# Patient Record
Sex: Female | Born: 1950 | Race: White | Hispanic: No | Marital: Married | Smoking: Never smoker
Health system: Southern US, Community
[De-identification: ages and names within clinical notes are randomized; demographics above are authoritative.]

## PROBLEM LIST (undated history)

## (undated) DIAGNOSIS — N183 Chronic kidney disease, stage 3 unspecified: Secondary | ICD-10-CM

## (undated) DIAGNOSIS — N289 Disorder of kidney and ureter, unspecified: Secondary | ICD-10-CM

## (undated) DIAGNOSIS — I10 Essential (primary) hypertension: Secondary | ICD-10-CM

## (undated) DIAGNOSIS — M810 Age-related osteoporosis without current pathological fracture: Secondary | ICD-10-CM

## (undated) HISTORY — PX: ABDOMINAL HYSTERECTOMY: SHX81

## (undated) HISTORY — PX: ANUS SURGERY: SHX302

## (undated) HISTORY — PX: BLADDER SURGERY: SHX569

---

## 2007-03-18 ENCOUNTER — Emergency Department (HOSPITAL_COMMUNITY): Admission: EM | Admit: 2007-03-18 | Discharge: 2007-03-18 | Payer: Self-pay | Admitting: Emergency Medicine

## 2007-07-13 ENCOUNTER — Ambulatory Visit: Payer: Self-pay | Admitting: Gastroenterology

## 2007-07-17 ENCOUNTER — Ambulatory Visit: Payer: Self-pay | Admitting: Gastroenterology

## 2007-07-17 ENCOUNTER — Observation Stay (HOSPITAL_COMMUNITY): Admission: AD | Admit: 2007-07-17 | Discharge: 2007-07-19 | Payer: Self-pay | Admitting: Gastroenterology

## 2007-07-19 ENCOUNTER — Ambulatory Visit: Payer: Self-pay | Admitting: Gastroenterology

## 2007-07-19 ENCOUNTER — Encounter: Payer: Self-pay | Admitting: Gastroenterology

## 2009-07-13 ENCOUNTER — Ambulatory Visit (HOSPITAL_COMMUNITY): Admission: RE | Admit: 2009-07-13 | Discharge: 2009-07-13 | Payer: Self-pay | Admitting: Family Medicine

## 2010-05-18 NOTE — Consult Note (Signed)
NAME:  Julia Mora, Julia Mora              ACCOUNT NO.:  [id number]   MEDICAL RECORD NO.:  [id number]          PATIENT TYPE:  AMB   LOCATION:  DAY                           FACILITY:  [facility]   PHYSICIAN:  Kassie Mends, M.D.      DATE OF BIRTH:  1950/04/09   DATE OF CONSULTATION:  DATE OF DISCHARGE:                                 CONSULTATION   REQUESTING PHYSICIAN:  Donna Bernard, M.D.   REASON FOR CONSULTATION:  History of adenomatous polyp due for  colonoscopy and chronic constipation.   HISTORY OF PRESENT ILLNESS:  Julia Mora is a 60 year old Caucasian  female with a history of chronic constipation.  She generally tells me  she has a bowel movement up just about every day, but at times have  small amounts of significant amount of straining.  She has increased the  fiber in her diet and been eating Bran Buns or Fiber One for breakfast.  This does seem to help significantly.  She denies any abdominal pain.  She does have bloating, usually in the evenings.  She is sensitive to  lactose and tries to avoid this.  She is taking vitamin D  supplementation and calcium.  She has noted dark stools, but denies any  rectal bleeding or melena.  She denies any history of diarrhea.  She  occasionally has fecal seepage since she had surgery in 1988 after a  difficult childbirth where she had her bladder tacked, sphincter repair,  and she has a history of rectocele.  She has rare indigestion.  Denies  any heartburn.  Denies any dysphagia, odynophagia, anorexia, or early  satiety.  Her weight has remained stable, although she does note she has  gained a couple of pounds over the last year or so.  She does have a  history of a large colon polyp which was removed in [place].  The  biopsy was not retrieved for pathology; however, she was told she would  need a repeat colonoscopy last year in 2008.  She did not present for  this given the fact that she was here in [place].   PAST MEDICAL AND  SURGICAL HISTORY:  Rectocele, chronic constipation,  colonoscopy in June 2005 were she had her large colon polyp removed, she  is status post partial hysterectomy, bladder tack, and sphincter repair.   CURRENT MEDICATIONS:  1. Hyzaar 50/12.5 mg daily.  2. Premarin 0.3 mg daily.  3. Tricor 125 mg daily.  4. Actonel 35 mg weekly.  5. Calcium 1200 mg.  6. Vitamin D 1 gram daily.  7. Fish oil 1 gram t.i.d.  8. Flax seed 1 gram daily.  9. Cinnamon 1 gram daily.  10.Aspirin 81 mg daily.  11.Mucinex p.r.n.  12.Claritin 10 mg p.r.n.  13.Fluticasone nasal spray daily.  14.Zaditor eye drops daily.   ALLERGIES:  ERYTHROMYCIN and CODEINE causes nausea and vomiting.   FAMILY HISTORY:  There is a family history of her mother having colon  polyps as well.  She is 88.  She has dementia.  Father deceased at 73  secondary to coronary artery disease and  MI.  She has 2 healthy  brothers.   SOCIAL HISTORY:  Julia Mora has been married for 34 years.  She has 2  grown healthy children.  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  Never has seen a smoker.  Denies any drug  use and rarely consumes red wine a couple of times a month at most.   REVIEW OF SYSTEMS:  See HPI, otherwise negative.   PHYSICAL EXAM:  VITAL SIGNS:  Weight 114 pounds, height 60 inches, temp  98.7, blood pressure 110/78, and pulse 64.  GENERAL:  She is a well-developed, well-nourished Caucasian female who  is alert, oriented, and pleasant in no acute distress.  HEENT:  Sclerae clear, nonicteric.  Conjunctivae pink.  Oropharynx pink  and moist without any lesions.  NECK:  Supple without mass or thyromegaly.  CHEST:  Heart regular rate rhythm.  Normal S1 and S2.  No murmurs,  clicks, rubs or gallops.  LUNGS:  Clear to auscultation bilaterally.  ABDOMEN:  Positive bowel sounds x4.  No bruits auscultated.  Soft,  nontender, and nondistended without mass or hepatosplenomegaly.  No  rebound,  tenderness, or guarding.  EXTREMITIES:  Without clubbing or edema.  SKIN:  Pale, warm, and dry without any rash or jaundice.   IMPRESSION:  Julia Mora is a 60 year old Caucasian female with chronic  constipation which responds to high-fiber diet.  I would suggest that  she continue fiber supplementation or consider adding fiber  supplementation on a daily basis.   She has a history of large polyp, unable to retrieve for pathology back  in June 2005.  It was recommended she have another colonoscopy in 3  years for surveillance.   PLAN:  1. Surveillance colonoscopy with Dr. Cira Servant in the near future.      Discussed procedures, risks, and benefits which include but not      limited to bleeding, infection, perforation, and drug reaction.      She agrees with the plan and consent was obtained.  2. She has been sent home literature on chronic constipation.  3. Benefiber samples to be taken once daily or fiber supplement of      choice.   Thank you Dr. Gerda Diss for allowing [place] to participate in the care of [name].      Lorenza Burton, N.P.      Kassie Mends, M.D.  Electronically Signed    KJ/MEDQ  D:  07/13/2007  T:  07/14/2007  Job:  [id number]   cc:   Donna Bernard, M.D.  Fax: [phone]

## 2010-05-18 NOTE — H&P (Signed)
Julia Mora, Julia Mora              ACCOUNT NO.:  [id number]   MEDICAL RECORD NO.:  [id number]          PATIENT TYPE:  OBV   LOCATION:  IC09                          FACILITY:  [facility]   PHYSICIAN:  Kassie Mends, M.D.      DATE OF BIRTH:  04/28/1950   DATE OF ADMISSION:  07/17/2007  DATE OF DISCHARGE:  LH                              HISTORY & PHYSICAL   PRIMARY CARE PHYSICIAN:  Donna Bernard, M.D.   REASON FOR ADMISSION:  Mental status changes.   HISTORY OF PRESENT ILLNESS:  Julia Mora is a 60 year old female who  has a history of chronic constipation.  Julia Mora reports having a colonoscopy  in [facility] for a large colon polyp but the polyp was not retrieved.  Julia Mora has a history of having to strain with bowel movements.  Julia Mora was  scheduled for a colonoscopy for surveillance because her outside  gastroenterologist recommended a 3-year follow-up.  Julia Mora took the Tri-  lyte prep. Julia Mora had multiple episodes of vomiting and multiple episodes  of diarrhea and began to have mental status changes.  Julia Mora began to have  mental status changes, was weak, and was crawling onthe bathroom floor.  I was contacted by the CRNA, Dillard Cannon, and asked the patient to be  directly admitted to the floor.   PAST MEDICAL HISTORY:  Obtained from the husband and the chart due to  the patient's mental status changes.  Rectocele.   PAST SURGICAL HISTORY:  Partial hysterectomy with bladder repair.   ALLERGIES:  ERYTHROMYCIN, CODEINE.   MEDICATIONS:  1. Hyzaar 50/12.5 mg daily.  2. Premarin.  3. Tricor.  4. Actonel.  5. Calcium.  6. Vitamin D.  7. Fish Oil.  8. Flax seed.  9. Cinnamon.  10.Aspirin 81 mg daily.  11.Claritin as needed.  12.Fluticasone daily.  13.Zaditor for allergic conjunctivitis.   FAMILY HISTORY:  Her mother had colon polyps.  Her mother has dementia.   SOCIAL HISTORY:  Julia Mora is married for 34 years.  Her husband is a  Engineering geologist here at [employer] [facility].   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  Julia Mora rarely drinks  alcohol.   REVIEW OF SYSTEMS:  As per the HPI, otherwise as limited by the  patient's inability to give a history.   PHYSICAL EXAMINATION:  VITAL SIGNS:  Temperature 97.5, pulse 82,  respiratory rate 22, blood pressure 144/87, O2 saturation 100% on room  air.  GENERAL:  Julia Mora is in no apparent distress, but appears slightly agitated.  Julia Mora is involuntarily moving her arms and shaking.  Julia Mora is alert and  interactive.  HEENT:  Normocephalic and atraumatic.  Pupils equal, round, and reactive  to light.  Mouth; no oral lesions.  Posterior pharynx without erythema  or exudate.  NECK:  Full range of motion with no lymphadenopathy. LUNGS:  Clear to  auscultation bilaterally. CARDIOVASCULAR:  Regular rhythm, no murmur,  normal S1 and S2. ABDOMEN:  Bowel sounds are present.  Soft, nontender,  and nondistended.  No rebound or guarding.  EXTREMITIES:  No  cyanosis or edema. NEUROLOGY:  Julia Mora has no focal  neurologic deficits.   LABORATORY DATA:  Sodium 119, potassium 3.2, BUN 14, creatinine 1.0,  calcium 8.6, magnesium 1.4 (blood drawn from the arm that has no IV  fluids infusing).   RADIOGRAPHIC STUDIES:  CT scan of the head without contrast; no acute  intracranial abnormality, scattered bilateral subcortical areas of white  matter hypoattenuation.   ASSESSMENT:  Julia Mora is a 60 year old female who was admitted with  mental status changes that is secondary to acute hyponatremia.  The  acute hyponatremia was brought on by hypovolemia from diarrhea and  polyethylene glycol bowel prep.   PLAN:  1. Normal saline at 75 mL per hour.  BMP every 6 hours.  2. Will replace magnesium with 6 grams total of magnesium sulfate.      Julia Mora will have a total of 80 mEq potassium infused.  3. Will only continue hydralazine as needed for blood pressure.  Will      hold her hydrochlorothiazide and losartan as well as  other      outpatient meds.  4. We will give one 0.5 mg of Ativan to decrease agitation.  5. NPO.  Will mix medications in normal saline instead of D5W when      possible.  6. Will transfer her to the ICU from the floor for overnight      observation.  Julia Mora does have slightly increased risk of seizure      activity due to the sodium levels of less than 120.   Discussed plan with Dr. Sharol Harness.      Kassie Mends, M.D.  Electronically Signed     SM/MEDQ  D:  07/18/2007  T:  07/18/2007  Job:  [id number]   cc:   Lorin Picket A. Gerda Diss, MD  Fax: [phone]

## 2010-05-18 NOTE — Op Note (Signed)
Julia Mora, TUCKERMAN              ACCOUNT NO.:  [id number]   MEDICAL RECORD NO.:  [id number]          PATIENT TYPE:  OBV   LOCATION:  A335                          FACILITY:  [facility]   PHYSICIAN:  Kassie Mends, M.D.      DATE OF BIRTH:  1950/10/20   DATE OF PROCEDURE:  07/19/2007  DATE OF DISCHARGE:                               OPERATIVE REPORT   REFERRING PHYSICIAN:  Donna Bernard, M.D.   PROCEDURE:  Colonoscopy with cold forceps polypectomy.   INDICATIONS FOR PROCEDURE:  [name] is a 60 year old female with a  personal history of polyps. Her preparation for the colonoscopy was  complicated by acute hyponatremia, hypomagnesemia, and hypokalemia.   FINDINGS:  1. A 3 mm rectal polyp removed via cold forceps.  Otherwise no masses,      inflammatory changes, diverticula or AVM seen.  2. Normal retroflexed view of the rectum.   DIAGNOSIS:  Small rectal polyp.   RECOMMENDATIONS:  1. Will call Ms. Bordas with results of her biopsies.  No aspirin,      NSAIDs or anticoagulation for 5 days.  2. She may resume her Hyzaar on discharge.  3. If she has a simple adenoma then she should have a screening      colonoscopy in 10 years.  4. High-fiber diet.  5. Advance diet today.   MEDICATIONS:  1. Demerol 50 mg IV.  2. Versed 4 mg IV.   PROCEDURE TECHNIQUE:  Physical exam was performed.  Informed consent was  obtained from the patient after explaining the benefits, risks and  alternatives to the procedure.  The patient was connected to the monitor  and placed in left lateral position.  Continuous oxygen was provided by  nasal cannula, IV medicine administered through an indwelling cannula.  After administration of sedation and rectal exam, the patient's rectum  was intubated and the  scope was advanced under direct visualization to the cecum.  Scope was  removed slowly by carefully examine the color, texture, anatomy and  integrity of the mucosa on the way out.  The patient  was recovered in  endoscopy and discharged to the floor in satisfactory condition.      Kassie Mends, M.D.  Electronically Signed     SM/MEDQ  D:  07/19/2007  T:  07/19/2007  Job:  [id number]   cc:   Donna Bernard, M.D.  Fax: [phone]

## 2010-05-21 NOTE — Discharge Summary (Signed)
Julia Mora, Julia Mora              ACCOUNT NO.:  [id number]   MEDICAL RECORD NO.:  [id number]          PATIENT TYPE:  OBV   LOCATION:  A335                          FACILITY:  [facility]   PHYSICIAN:  Kassie Mends, M.D.      DATE OF BIRTH:  23-Dec-1950   DATE OF ADMISSION:  07/17/2007  DATE OF DISCHARGE:  07/16/2009LH                               DISCHARGE SUMMARY   DISCHARGE DIAGNOSES:  1. Acute hyponatremia, hypokalemia, and hypomagnesemia after taking      TriLyte bowel prep which was associated with mental status changes      and weakness.  2. Hypertension on Hyzaar.  3. Hyperlipidemia.  4. Osteoporosis.  5. Allergies.   PROCEDURES:  Colonoscopy with cold forceps polypectomy.   HISTORY OF PRESENT ILLNESS:  Julia Mora is a 60 year old female who  was prepping for colonoscopy due to the history of a large colon polyp  removed in [place], [place].  She did the TriLyte prep and had  multiple episodes of vomiting, diarrhea, and developed mental status  changes.   HOSPITAL COURSE:  Julia Mora was admitted with mental status changes,  weakness, and crawling on the bathroom floor.  On admission, she was  afebrile and hemodynamically stable.  CT scan of the head revealed  scattered bilateral subcortical areas of white matter, which were  nonspecific.  She also was found of a sodium 119, potassium of 3.2, and  magnesium of 1.4.  She was placed on normal saline, and on the day of  discharge, her sodium is 138.  Her magnesium was replaced to a level of  3.4.  Her potassium was 3.9.  Her mental status was improved on hospital  day 2.  She was continued on a clear liquid diet.  Colonoscopy was  performed on July 19, 2007.  She was discharged in satisfactory  condition and returned to her cognitive baseline.   RECOMMENDATIONS:  The patient should have future bowel preps that do not  include large volume polyethylene glycol.  I discussed this with [name] and her husband.   The alternatives of bowel preps include  Gatorade prep with MiraLax or a MiraLax bowel prep or OsmoPrep.      Kassie Mends, M.D.  Electronically Signed    SM/MEDQ  D:  08/01/2007  T:  08/02/2007  Job:  [id number]   cc:   Donna Bernard, M.D.  Fax: [phone]

## 2010-09-27 LAB — BASIC METABOLIC PANEL
Creatinine, Ser: 1.27 — ABNORMAL HIGH
GFR calc Af Amer: 53 — ABNORMAL LOW
GFR calc non Af Amer: 44 — ABNORMAL LOW
Potassium: 4.4
Sodium: 138

## 2010-09-30 LAB — BASIC METABOLIC PANEL
Calcium: 8.6
GFR calc Af Amer: >60

## 2010-10-01 LAB — BASIC METABOLIC PANEL
BUN: 11
BUN: 11
BUN: 9
CO2: 21
CO2: 21
CO2: 22
CO2: 23
Calcium: 7.6 — ABNORMAL LOW
Calcium: 8.3 — ABNORMAL LOW
Calcium: 8.8
Chloride: 111
Chloride: 92 — ABNORMAL LOW
Chloride: 95 — ABNORMAL LOW
Creatinine, Ser: 0.94
Creatinine, Ser: 0.95
Creatinine, Ser: 1.03
GFR calc Af Amer: >60
GFR calc Af Amer: >60
GFR calc non Af Amer: 59 — ABNORMAL LOW
Glucose, Bld: 100 — ABNORMAL HIGH
Glucose, Bld: 104 — ABNORMAL HIGH
Glucose, Bld: 123 — ABNORMAL HIGH
Glucose, Bld: 124 — ABNORMAL HIGH
Potassium: 3.9
Potassium: 3.9
Potassium: 4
Potassium: 4.1
Sodium: 123 — ABNORMAL LOW
Sodium: 124 — ABNORMAL LOW
Sodium: 131 — ABNORMAL LOW

## 2010-10-01 LAB — HEPATIC FUNCTION PANEL
ALT: 16
AST: 36
Albumin: 3.5
Indirect Bilirubin: 0.8
Total Bilirubin: 1

## 2010-10-01 LAB — MAGNESIUM
Magnesium: 3.4 — ABNORMAL HIGH
Magnesium: 3.7 — ABNORMAL HIGH

## 2012-07-14 ENCOUNTER — Encounter (HOSPITAL_BASED_OUTPATIENT_CLINIC_OR_DEPARTMENT_OTHER): Payer: Self-pay | Admitting: *Deleted

## 2012-07-14 ENCOUNTER — Emergency Department (HOSPITAL_BASED_OUTPATIENT_CLINIC_OR_DEPARTMENT_OTHER)
Admission: EM | Admit: 2012-07-14 | Discharge: 2012-07-14 | Disposition: A | Discharge status: Home / Self Care | Payer: BC Managed Care – PPO | Attending: Emergency Medicine | Admitting: Emergency Medicine

## 2012-07-14 ENCOUNTER — Emergency Department (HOSPITAL_BASED_OUTPATIENT_CLINIC_OR_DEPARTMENT_OTHER): Payer: BC Managed Care – PPO

## 2012-07-14 DIAGNOSIS — X500XXA Overexertion from strenuous movement or load, initial encounter: Secondary | ICD-10-CM | POA: Insufficient documentation

## 2012-07-14 DIAGNOSIS — Z8739 Personal history of other diseases of the musculoskeletal system and connective tissue: Secondary | ICD-10-CM | POA: Insufficient documentation

## 2012-07-14 DIAGNOSIS — S92309A Fracture of unspecified metatarsal bone(s), unspecified foot, initial encounter for closed fracture: Secondary | ICD-10-CM | POA: Insufficient documentation

## 2012-07-14 DIAGNOSIS — I10 Essential (primary) hypertension: Secondary | ICD-10-CM | POA: Insufficient documentation

## 2012-07-14 DIAGNOSIS — Z87448 Personal history of other diseases of urinary system: Secondary | ICD-10-CM | POA: Insufficient documentation

## 2012-07-14 DIAGNOSIS — S92351A Displaced fracture of fifth metatarsal bone, right foot, initial encounter for closed fracture: Secondary | ICD-10-CM

## 2012-07-14 DIAGNOSIS — Y9301 Activity, walking, marching and hiking: Secondary | ICD-10-CM | POA: Insufficient documentation

## 2012-07-14 DIAGNOSIS — Z79899 Other long term (current) drug therapy: Secondary | ICD-10-CM | POA: Insufficient documentation

## 2012-07-14 DIAGNOSIS — Y9289 Other specified places as the place of occurrence of the external cause: Secondary | ICD-10-CM | POA: Insufficient documentation

## 2012-07-14 HISTORY — DX: Age-related osteoporosis without current pathological fracture: M81.0

## 2012-07-14 HISTORY — DX: Essential (primary) hypertension: I10

## 2012-07-14 HISTORY — DX: Disorder of kidney and ureter, unspecified: N28.9

## 2012-07-14 MED ORDER — HYDROCODONE-ACETAMINOPHEN 5-325 MG PO TABS: 0.5000 | ORAL_TABLET | ORAL | Status: DC | PRN

## 2012-07-14 MED ORDER — HYDROCODONE-ACETAMINOPHEN 5-325 MG PO TABS
1.0000 | ORAL_TABLET | Freq: Once | ORAL | Status: AC
  Administered 2012-07-14: 1 via ORAL
  Filled 2012-07-14: qty 1

## 2012-07-14 NOTE — ED Provider Notes (Signed)
History     This chart was scribed for Julia Seamen, MD by Julia Mora, ED Scribe. The patient was seen in room MH05/MH05 and the patient's care was started at 11:01 PM.  CSN: [id number] Arrival date & time 07/14/12  2058   Chief Complaint  Patient presents with  . Foot Injury   The history is provided by the patient and medical records. No language interpreter was used.   HPI Comments: Julia Mora is a 62 y.o. female who presents to the Emergency Department complaining of a pop in her right lateral foot around 6:15 pm.  She has subsequently had moderate pain to lateral aspect of right foot anterior to lateral malleolus.  She states that she was walking when this injury occurred.  No numbness or weakness in right foot.  Pt denies neck injury, headache, diaphoresis, fever, chills, nausea, vomiting, diarrhea, weakness, cough, SOB and any other pain.   Past Medical History  Diagnosis Date  . Hypertension   . Renal disorder   . Osteoporosis    Past Surgical History  Procedure Laterality Date  . Abdominal hysterectomy    . Bladder surgery    . Anus surgery     History reviewed. No pertinent family history. History  Substance Use Topics  . Smoking status: Never Smoker   . Smokeless tobacco: Not on file  . Alcohol Use: No   OB History   Grav Para Term Preterm Abortions TAB SAB Ect Mult Living                 Review of Systems  All other systems reviewed and are negative.    Allergies  Codeine and Erythromycin  Home Medications   Current Outpatient Rx  Name  Route  Sig  Dispense  Refill  . fenofibrate (TRICOR) 145 MG tablet   Oral   Take 145 mg by mouth daily.         Marland Kitchen losartan (COZAAR) 25 MG tablet   Oral   Take 25 mg by mouth daily.         Marland Kitchen HYDROcodone-acetaminophen (NORCO/VICODIN) 5-325 MG per tablet   Oral   Take 0.5-1 tablets by mouth every 4 (four) hours as needed for pain.   20 tablet   0    BP 154/80  Pulse 72  Temp(Src) 98.7 F (37.1  C) (Oral)  Resp 20  Ht 5' (1.524 m)  Wt 112 lb (50.803 kg)  BMI 21.87 kg/m2  SpO2 99% Physical Exam  Nursing note and vitals reviewed. Constitutional: She is oriented to person, place, and time. She appears well-developed and well-nourished. No distress.  HENT:  Head: Normocephalic and atraumatic.  Right Ear: External ear normal.  Left Ear: External ear normal.  Eyes: EOM are normal. Pupils are equal, round, and reactive to light.  Neck: Normal range of motion. Neck supple. No tracheal deviation present.  Cardiovascular: Normal rate, regular rhythm and normal heart sounds.  Exam reveals no gallop and no friction rub.   No murmur heard. Pulmonary/Chest: Effort normal and breath sounds normal. No respiratory distress.  Abdominal: Soft. Bowel sounds are normal. She exhibits no distension. There is no tenderness.  Musculoskeletal: Normal range of motion. She exhibits tenderness.  Tenderness over base of right 5th metatarsal with no appreciable swelling or ecchymosis.  Right foot distally neurovascularly intact with right nervous function intact.  No tenderness of instability of right ankle.  No proximal right fibular tenderness.  Neurological: She is alert and  oriented to person, place, and time.  Neurologically intact.  Skin: Skin is warm and dry.  Psychiatric: She has a normal mood and affect. Her behavior is normal.    ED Course  Procedures (including critical care time) DIAGNOSTIC STUDIES: Oxygen Saturation is 99% on RA, normal by my interpretation.    COORDINATION OF CARE: 11:04 PM - Discussed ED treatment with pt at bedside including pain management and pt agrees.   Discussed elevation and icing area.  Follow up with orthopedist.  Labs Reviewed - No data to display Dg Foot Complete Right  07/14/2012   *RADIOLOGY REPORT*  Clinical Data: Foot injury.  RIGHT FOOT COMPLETE - 3+ VIEW  Comparison: None.  Findings: Examination demonstrates mild diffuse osteopenia.  There is a  nondisplaced transverse fracture to the base of the fifth metatarsal.  There mild degenerate changes of the first MTP joint. Remainder of the exam is unremarkable.  IMPRESSION: Nondisplaced transverse fracture through the base of the fifth metatarsal.   Original Report Authenticated By: Elberta Fortis, M.D.   1. Fracture of base of fifth metatarsal bone, right, closed, initial encounter     MDM  I personally performed the services described in this documentation, which was scribed in my presence.  The recorded information has been reviewed and is accurate.    Carlisle Beers Julia Kronberg, MD 07/15/12 0630

## 2012-07-14 NOTE — ED Notes (Signed)
Pt states she has a hx of osteoporosis and was just walking across the room and felt something "pop" in her right foot. C/O pain to same. PMS intact.

## 2012-07-18 ENCOUNTER — Ambulatory Visit (INDEPENDENT_AMBULATORY_CARE_PROVIDER_SITE_OTHER): Payer: BC Managed Care – PPO | Admitting: Family Medicine

## 2012-07-18 ENCOUNTER — Encounter: Payer: Self-pay | Admitting: Family Medicine

## 2012-07-18 VITALS — BP 150/89 | HR 62 | Ht 60.0 in | Wt 112.0 lb

## 2012-07-18 DIAGNOSIS — M79671 Pain in right foot: Secondary | ICD-10-CM

## 2012-07-18 DIAGNOSIS — M79609 Pain in unspecified limb: Secondary | ICD-10-CM

## 2012-07-18 NOTE — Patient Instructions (Addendum)
You have a Jones fracture of your foot.   We will start with conservative treatment of this. Do not put any weight on casted foot. Use wheelchair and/or knee scooter to get around. Anticipate 8-12 weeks of this treatment. Follow up with me in 2-4 weeks to remove cast and repeat x-rays (when you come back depends on when your cast feels like it's uncomfortable or too loose - come back in 4 weeks at the latest though). Elevate above the level of your heart as much as possible. Tylenol and/or aleve as needed for pain. If you need something stronger for pain call me.

## 2012-07-24 ENCOUNTER — Encounter: Payer: Self-pay | Admitting: Family Medicine

## 2012-07-24 DIAGNOSIS — M79671 Pain in right foot: Secondary | ICD-10-CM | POA: Insufficient documentation

## 2012-07-24 NOTE — Progress Notes (Signed)
Patient ID: Julia Mora, female   DOB: February 02, 1950, 62 y.o.   MRN: [id number]  PCP: Julia Mora  Subjective:   HPI: Patient is a 62 y.o. female here for right foot injury.  Patient reports on 7/12 she was walking across the room when she felt a pop lateral aspect of right foot. Started icing, taking tylenol. Went to ED and had x-rays showing a transverse 5th metatarsal fracture [name]). Placed in a postop shoe, referred to [place]. Some bruising and swelling. Has history of a cracked bone in 2003 of this foot or ankle but unsure which one. Takes actonel monthly and has known osteopenia.  Past Medical History  Diagnosis Date  . Hypertension   . Renal disorder   . Osteoporosis     Current Outpatient Prescriptions on File Prior to Visit  Medication Sig Dispense Refill  . fenofibrate (TRICOR) 145 MG tablet Take 145 mg by mouth daily.      Marland Kitchen HYDROcodone-acetaminophen (NORCO/VICODIN) 5-325 MG per tablet Take 0.5-1 tablets by mouth every 4 (four) hours as needed for pain.  20 tablet  0  . losartan (COZAAR) 25 MG tablet Take 25 mg by mouth daily.       No current facility-administered medications on file prior to visit.    Past Surgical History  Procedure Laterality Date  . Abdominal hysterectomy    . Bladder surgery    . Anus surgery      Allergies  Allergen Reactions  . Codeine Other (See Comments)    Abd pain  . Erythromycin Other (See Comments)    Abd pain    History   Social History  . Marital Status: Married    Spouse Name: N/A    Number of Children: N/A  . Years of Education: N/A   Occupational History  . Not on file.   Social History Main Topics  . Smoking status: Never Smoker   . Smokeless tobacco: Not on file  . Alcohol Use: No  . Drug Use: No  . Sexually Active: Not on file   Other Topics Concern  . Not on file   Social History Narrative  . No narrative on file    Family History  Problem Relation Age of Onset  . Heart attack Father   .  Diabetes Neg Hx   . Hyperlipidemia Neg Hx   . Hypertension Neg Hx     BP 150/89  Pulse 62  Ht 5' (1.524 m)  Wt 112 lb (50.803 kg)  BMI 21.87 kg/m2  Review of Systems: See HPI above.    Objective:  Physical Exam:  Gen: NAD  R foot/ankle: Mild swelling, bruising lateral foot.  No other deformity. FROM ankle. TTP focally base 5th metatarsal. Negative ant drawer and talar tilt.   Negative syndesmotic compression. Thompsons test negative. NV intact distally.    Assessment & Plan:  1. Jones fracture - discussed both operative and nonoperative management.  Advised typically in her case would trial conservative care first before considering surgery.  She would like to proceed in this manner - discussed can take 6-12 weeks to heal and must be non weight bearing for it to do so.  She wants to use a wheelchair - believes her husband can get one from work.  Short leg cast placed today - return in 2-4 weeks to remove this and repeat x-rays.  Elevation.  Tylenol, aleve as needed for pain.  Declined stronger pain medicine for now.

## 2012-07-24 NOTE — Assessment & Plan Note (Signed)
Jones fracture - discussed both operative and nonoperative management.  Advised typically in her case would trial conservative care first before considering surgery.  She would like to proceed in this manner - discussed can take 6-12 weeks to heal and must be non weight bearing for it to do so.  She wants to use a wheelchair - believes her husband can get one from work.  Short leg cast placed today - return in 2-4 weeks to remove this and repeat x-rays.  Elevation.  Tylenol, aleve as needed for pain.  Declined stronger pain medicine for now.

## 2012-08-01 ENCOUNTER — Ambulatory Visit (INDEPENDENT_AMBULATORY_CARE_PROVIDER_SITE_OTHER): Payer: BC Managed Care – PPO | Admitting: Family Medicine

## 2012-08-01 ENCOUNTER — Ambulatory Visit (HOSPITAL_BASED_OUTPATIENT_CLINIC_OR_DEPARTMENT_OTHER)
Admission: RE | Admit: 2012-08-01 | Discharge: 2012-08-01 | Disposition: A | Discharge status: Home / Self Care | Payer: BC Managed Care – PPO | Source: Ambulatory Visit | Attending: Family Medicine | Admitting: Family Medicine

## 2012-08-01 ENCOUNTER — Encounter: Payer: Self-pay | Admitting: Family Medicine

## 2012-08-01 VITALS — BP 152/101 | HR 66 | Ht 60.0 in | Wt 111.0 lb

## 2012-08-01 DIAGNOSIS — X58XXXA Exposure to other specified factors, initial encounter: Secondary | ICD-10-CM | POA: Insufficient documentation

## 2012-08-01 DIAGNOSIS — S92309A Fracture of unspecified metatarsal bone(s), unspecified foot, initial encounter for closed fracture: Secondary | ICD-10-CM | POA: Insufficient documentation

## 2012-08-01 DIAGNOSIS — M79609 Pain in unspecified limb: Secondary | ICD-10-CM

## 2012-08-01 DIAGNOSIS — M79671 Pain in right foot: Secondary | ICD-10-CM

## 2012-08-02 ENCOUNTER — Encounter: Payer: Self-pay | Admitting: Family Medicine

## 2012-08-02 NOTE — Assessment & Plan Note (Signed)
Jones fracture - 2 1/2 weeks out from initial injury.  No pain subjectively and minimal tenderness at fracture site now.  So doing well with clinical healing though no callus yet seen on radiographs.  New cast placed today - still non-weight bearing.  Advised to return in 4 weeks for cast removal, repeat radiographs (can return sooner if cast becomes loose or any other complaints).  Tylenol, elevation as needed.

## 2012-08-02 NOTE — Progress Notes (Signed)
Patient ID: Julia Mora, female   DOB: 1950/06/29, 62 y.o.   MRN: [id number]  PCP: Aida Puffer  Subjective:   HPI: Patient is a 62 y.o. female here for f/u right foot injury.  7/16: Patient reports on 7/12 she was walking across the room when she felt a pop lateral aspect of right foot. Started icing, taking tylenol. Went to ED and had x-rays showing a transverse 5th metatarsal fracture [name]). Placed in a postop shoe, referred to [place]. Some bruising and swelling. Has history of a cracked bone in 2003 of this foot or ankle but unsure which one. Takes actonel monthly and has known osteopenia.  7/30: Patient reports her pain has improved since last visit. Not weight bearing - using a wheelchair or walker with knee up on the seat. Not taking anything for pain. Doing well with cast with no complaints.  Past Medical History  Diagnosis Date  . Hypertension   . Renal disorder   . Osteoporosis     Current Outpatient Prescriptions on File Prior to Visit  Medication Sig Dispense Refill  . ACTONEL 150 MG tablet       . alclomethasone (ACLOVATE) 0.05 % cream       . estradiol (ESTRACE) 0.5 MG tablet       . fenofibrate (TRICOR) 145 MG tablet Take 145 mg by mouth daily.      Marland Kitchen HYDROcodone-acetaminophen (NORCO/VICODIN) 5-325 MG per tablet Take 0.5-1 tablets by mouth every 4 (four) hours as needed for pain.  20 tablet  0  . losartan (COZAAR) 25 MG tablet Take 25 mg by mouth daily.       No current facility-administered medications on file prior to visit.    Past Surgical History  Procedure Laterality Date  . Abdominal hysterectomy    . Bladder surgery    . Anus surgery      Allergies  Allergen Reactions  . Codeine Other (See Comments)    Abd pain  . Erythromycin Other (See Comments)    Abd pain    History   Social History  . Marital Status: Married    Spouse Name: N/A    Number of Children: N/A  . Years of Education: N/A   Occupational History  . Not on file.    Social History Main Topics  . Smoking status: Never Smoker   . Smokeless tobacco: Not on file  . Alcohol Use: No  . Drug Use: No  . Sexually Active: Not on file   Other Topics Concern  . Not on file   Social History Narrative  . No narrative on file    Family History  Problem Relation Age of Onset  . Heart attack Father   . Diabetes Neg Hx   . Hyperlipidemia Neg Hx   . Hypertension Neg Hx     BP 152/101  Pulse 66  Ht 5' (1.524 m)  Wt 111 lb (50.349 kg)  BMI 21.68 kg/m2  Review of Systems: See HPI above.    Objective:  Physical Exam:  Gen: NAD  R foot/ankle: Minimal swelling, no bruising lateral foot.  No other deformity. Did not test ROM ankle. Minimal TTP focally base 5th metatarsal. Negative ant drawer and talar tilt.   Negative syndesmotic compression. NV intact distally.    Assessment & Plan:  1. Jones fracture - 2 1/2 weeks out from initial injury.  No pain subjectively and minimal tenderness at fracture site now.  So doing well with clinical healing though no  callus yet seen on radiographs.  New cast placed today - still non-weight bearing.  Advised to return in 4 weeks for cast removal, repeat radiographs (can return sooner if cast becomes loose or any other complaints).  Tylenol, elevation as needed.

## 2012-08-17 ENCOUNTER — Ambulatory Visit (INDEPENDENT_AMBULATORY_CARE_PROVIDER_SITE_OTHER): Payer: BC Managed Care – PPO | Admitting: Family Medicine

## 2012-08-17 ENCOUNTER — Encounter: Payer: Self-pay | Admitting: Family Medicine

## 2012-08-17 VITALS — BP 162/103 | HR 57 | Ht 60.0 in | Wt 111.0 lb

## 2012-08-17 DIAGNOSIS — M79671 Pain in right foot: Secondary | ICD-10-CM

## 2012-08-17 DIAGNOSIS — S8992XA Unspecified injury of left lower leg, initial encounter: Secondary | ICD-10-CM

## 2012-08-17 DIAGNOSIS — M79609 Pain in unspecified limb: Secondary | ICD-10-CM

## 2012-08-17 DIAGNOSIS — S8990XA Unspecified injury of unspecified lower leg, initial encounter: Secondary | ICD-10-CM

## 2012-08-21 ENCOUNTER — Encounter: Payer: Self-pay | Admitting: Family Medicine

## 2012-08-21 DIAGNOSIS — S8992XA Unspecified injury of left lower leg, initial encounter: Secondary | ICD-10-CM | POA: Insufficient documentation

## 2012-08-21 NOTE — Assessment & Plan Note (Signed)
no red flags and exam is currently benign.  Will continue to monitor.

## 2012-08-21 NOTE — Progress Notes (Signed)
Patient ID: Julia Mora, female   DOB: [birth year], 62 y.o.   MRN: [id number]  PCP: Aida Puffer  Subjective:   HPI: Patient is a 62 y.o. female here for f/u right foot injury.  7/16: Patient reports on 7/12 she was walking across the room when she felt a pop lateral aspect of right foot. Started icing, taking tylenol. Went to ED and had x-rays showing a transverse 5th metatarsal fracture [name]). Placed in a postop shoe, referred to [place]. Some bruising and swelling. Has history of a cracked bone in 2003 of this foot or ankle but unsure which one. Takes actonel monthly and has known osteopenia.  7/30: Patient reports her pain has improved since last visit. Not weight bearing - using a wheelchair or walker with knee up on the seat. Not taking anything for pain. Doing well with cast with no complaints.  8/15: Overall doing well. She fell last week at the wedding she was attending and fell directly onto both knees and wrists - wrists and knees improving though left knee is still a little stiff. No reinjury to her right foot.  Past Medical History  Diagnosis Date  . Hypertension   . Renal disorder   . Osteoporosis     Current Outpatient Prescriptions on File Prior to Visit  Medication Sig Dispense Refill  . ACTONEL 150 MG tablet       . alclomethasone (ACLOVATE) 0.05 % cream       . estradiol (ESTRACE) 0.5 MG tablet       . fenofibrate (TRICOR) 145 MG tablet Take 145 mg by mouth daily.      Marland Kitchen HYDROcodone-acetaminophen (NORCO/VICODIN) 5-325 MG per tablet Take 0.5-1 tablets by mouth every 4 (four) hours as needed for pain.  20 tablet  0  . losartan (COZAAR) 25 MG tablet Take 25 mg by mouth daily.       No current facility-administered medications on file prior to visit.    Past Surgical History  Procedure Laterality Date  . Abdominal hysterectomy    . Bladder surgery    . Anus surgery      Allergies  Allergen Reactions  . Codeine Other (See Comments)    Abd pain   . Erythromycin Other (See Comments)    Abd pain    History   Social History  . Marital Status: Married    Spouse Name: N/A    Number of Children: N/A  . Years of Education: N/A   Occupational History  . Not on file.   Social History Main Topics  . Smoking status: Never Smoker   . Smokeless tobacco: Not on file  . Alcohol Use: No  . Drug Use: No  . Sexual Activity: Not on file   Other Topics Concern  . Not on file   Social History Narrative  . No narrative on file    Family History  Problem Relation Age of Onset  . Heart attack Father   . Diabetes Neg Hx   . Hyperlipidemia Neg Hx   . Hypertension Neg Hx     BP 162/103  Pulse 57  Ht 5' (1.524 m)  Wt 111 lb (50.349 kg)  BMI 21.68 kg/m2  Review of Systems: See HPI above.    Objective:  Physical Exam:  Gen: NAD  R foot/ankle: Cast is intact - did not remove at this time. Minimal swelling, no bruising lateral foot.  No other deformity. Did not test ROM ankle. Minimal TTP focally base 5th metatarsal.  Negative ant drawer and talar tilt.   Negative syndesmotic compression. NV intact distally.    L knee: No gross deformity, ecchymoses, swelling. No TTP. FROM. Negative ant/post drawers. Negative valgus/varus testing. Negative lachmanns. Negative mcmurrays, apleys, patellar apprehension. NV intact distally.  Assessment & Plan:  1. Jones fracture - About 4 1/2 weeks out from initial injury - recommended we wait until she is 6 weeks out to remove cast and repeat her x-rays and exam at that time.  Tylenol, elevation as needed.    2. Left knee injury - no red flags and exam is currently benign.  Will continue to monitor.

## 2012-08-21 NOTE — Assessment & Plan Note (Signed)
Jones fracture - About 4 1/2 weeks out from initial injury - recommended we wait until she is 6 weeks out to remove cast and repeat her x-rays and exam at that time.  Tylenol, elevation as needed.

## 2012-08-29 ENCOUNTER — Ambulatory Visit (INDEPENDENT_AMBULATORY_CARE_PROVIDER_SITE_OTHER): Payer: BC Managed Care – PPO | Admitting: Family Medicine

## 2012-08-29 ENCOUNTER — Ambulatory Visit (HOSPITAL_BASED_OUTPATIENT_CLINIC_OR_DEPARTMENT_OTHER)
Admission: RE | Admit: 2012-08-29 | Discharge: 2012-08-29 | Disposition: A | Discharge status: Home / Self Care | Payer: BC Managed Care – PPO | Source: Ambulatory Visit | Attending: Family Medicine | Admitting: Family Medicine

## 2012-08-29 ENCOUNTER — Ambulatory Visit: Payer: BC Managed Care – PPO | Admitting: Family Medicine

## 2012-08-29 ENCOUNTER — Encounter: Payer: Self-pay | Admitting: Family Medicine

## 2012-08-29 VITALS — BP 150/93 | HR 64 | Ht 60.0 in | Wt 111.0 lb

## 2012-08-29 DIAGNOSIS — M79609 Pain in unspecified limb: Secondary | ICD-10-CM

## 2012-08-29 DIAGNOSIS — M79671 Pain in right foot: Secondary | ICD-10-CM

## 2012-08-29 DIAGNOSIS — X58XXXA Exposure to other specified factors, initial encounter: Secondary | ICD-10-CM | POA: Insufficient documentation

## 2012-08-29 DIAGNOSIS — S92309A Fracture of unspecified metatarsal bone(s), unspecified foot, initial encounter for closed fracture: Secondary | ICD-10-CM | POA: Insufficient documentation

## 2012-08-29 DIAGNOSIS — M19079 Primary osteoarthritis, unspecified ankle and foot: Secondary | ICD-10-CM | POA: Insufficient documentation

## 2012-08-29 NOTE — Patient Instructions (Addendum)
You have evidence of callus on your ultrasound and no pain, have completed 6 weeks of casting without weight bearing. Because of this switch to a comfortable shoe and over the next 1-2 weeks start bearing weight. It's ok if you have a little bit of pain (1-2 out of 10) but it shouldn't be worse than this. If it is, stop and give me a call. Ice the area as needed for pain. Follow up with me in 2 weeks for reevaluation, probable final visit.

## 2012-08-30 ENCOUNTER — Encounter: Payer: Self-pay | Admitting: Family Medicine

## 2012-08-30 NOTE — Assessment & Plan Note (Signed)
Jones fracture - 6 weeks out from fracture - completed immobilization in this time without weight bearing.  Has callus formation both on radiographs and ultrasound and no pain clinically, no tenderness.  Will start weight bearing at this time.  Switch to a comfortable shoe and progressively put more and more weight on this foot in next 1-2 weeks.  Ice as needed.  F/u in 2 weeks to see how she's doing with this. If pain intensifies stop bearing weight and call me.

## 2012-08-30 NOTE — Progress Notes (Signed)
Patient ID: Julia Mora, female   DOB: 1950-03-22, 62 y.o.   MRN: [id number]  PCP: Aida Puffer  Subjective:   HPI: Patient is a 62 y.o. female here for f/u right foot injury.  7/16: Patient reports on 7/12 she was walking across the room when she felt a pop lateral aspect of right foot. Started icing, taking tylenol. Went to ED and had x-rays showing a transverse 5th metatarsal fracture [name]). Placed in a postop shoe, referred to [place]. Some bruising and swelling. Has history of a cracked bone in 2003 of this foot or ankle but unsure which one. Takes actonel monthly and has known osteopenia.  7/30: Patient reports her pain has improved since last visit. Not weight bearing - using a wheelchair or walker with knee up on the seat. Not taking anything for pain. Doing well with cast with no complaints.  8/15: Overall doing well. She fell last week at the wedding she was attending and fell directly onto both knees and wrists - wrists and knees improving though left knee is still a little stiff. No reinjury to her right foot.  8/27: Patient has no pain and done well in cast. Not taking any medicines for pain. Elevating foot.  Past Medical History  Diagnosis Date  . Hypertension   . Renal disorder   . Osteoporosis     Current Outpatient Prescriptions on File Prior to Visit  Medication Sig Dispense Refill  . ACTONEL 150 MG tablet       . alclomethasone (ACLOVATE) 0.05 % cream       . estradiol (ESTRACE) 0.5 MG tablet       . fenofibrate (TRICOR) 145 MG tablet Take 145 mg by mouth daily.      Marland Kitchen losartan (COZAAR) 25 MG tablet Take 25 mg by mouth daily.       No current facility-administered medications on file prior to visit.    Past Surgical History  Procedure Laterality Date  . Abdominal hysterectomy    . Bladder surgery    . Anus surgery      Allergies  Allergen Reactions  . Codeine Other (See Comments)    Abd pain  . Erythromycin Other (See Comments)    Abd  pain    History   Social History  . Marital Status: Married    Spouse Name: N/A    Number of Children: N/A  . Years of Education: N/A   Occupational History  . Not on file.   Social History Main Topics  . Smoking status: Never Smoker   . Smokeless tobacco: Not on file  . Alcohol Use: No  . Drug Use: No  . Sexual Activity: Not on file   Other Topics Concern  . Not on file   Social History Narrative  . No narrative on file    Family History  Problem Relation Age of Onset  . Heart attack Father   . Diabetes Neg Hx   . Hyperlipidemia Neg Hx   . Hypertension Neg Hx     BP 150/93  Pulse 64  Ht 5' (1.524 m)  Wt 111 lb (50.349 kg)  BMI 21.68 kg/m2  Review of Systems: See HPI above.    Objective:  Physical Exam:  Gen: NAD  R foot/ankle: Cast removed. Minimal swelling, no bruising lateral foot.  No other deformity. Did not test ROM ankle. No TTP 5th metatarsal. Negative ant drawer and talar tilt.   Negative syndesmotic compression. NV intact distally.  Assessment &  Plan:  1. Jones fracture - 6 weeks out from fracture - completed immobilization in this time without weight bearing.  Has callus formation both on radiographs and ultrasound and no pain clinically, no tenderness.  Will start weight bearing at this time.  Switch to a comfortable shoe and progressively put more and more weight on this foot in next 1-2 weeks.  Ice as needed.  F/u in 2 weeks to see how she's doing with this. If pain intensifies stop bearing weight and call me.

## 2012-09-12 ENCOUNTER — Encounter: Payer: Self-pay | Admitting: Family Medicine

## 2012-09-12 ENCOUNTER — Ambulatory Visit (INDEPENDENT_AMBULATORY_CARE_PROVIDER_SITE_OTHER): Payer: BC Managed Care – PPO | Admitting: Family Medicine

## 2012-09-12 VITALS — BP 130/84 | HR 67 | Ht 60.0 in | Wt 110.0 lb

## 2012-09-12 DIAGNOSIS — M79609 Pain in unspecified limb: Secondary | ICD-10-CM

## 2012-09-12 DIAGNOSIS — M79671 Pain in right foot: Secondary | ICD-10-CM

## 2012-09-13 ENCOUNTER — Encounter: Payer: Self-pay | Admitting: Family Medicine

## 2012-09-13 NOTE — Assessment & Plan Note (Signed)
Jones fracture - 8 weeks out from fracture - completed 6 weeks immobilization and radiographs/ultrasound showed callus formation.  Done very well past 2 weeks with start of ambulation.  At this point will discharge without restrictions.  F/u prn.

## 2012-09-13 NOTE — Patient Instructions (Addendum)
Instructed to follow up as needed 

## 2012-09-13 NOTE — Progress Notes (Signed)
Patient ID: Julia Mora, female   DOB: May 09, 1950, 62 y.o.   MRN: [id number]  PCP: Julia Mora  Subjective:   HPI: Patient is a 62 y.o. female here for f/u right foot injury.  7/16: Patient reports on 7/12 she was walking across the room when she felt a pop lateral aspect of right foot. Started icing, taking tylenol. Went to ED and had x-rays showing a transverse 5th metatarsal fracture Julia Mora). Placed in a postop shoe, referred to [place]. Some bruising and swelling. Has history of a cracked bone in 2003 of this foot or ankle but unsure which one. Takes actonel monthly and has known osteopenia.  7/30: Patient reports her pain has improved since last visit. Not weight bearing - using a wheelchair or walker with knee up on the seat. Not taking anything for pain. Doing well with cast with no complaints.  8/15: Overall doing well. She fell last week at the wedding she was attending and fell directly onto both knees and wrists - wrists and knees improving though left knee is still a little stiff. No reinjury to her right foot.  8/27: Patient has no pain and done well in cast. Not taking any medicines for pain. Elevating foot.  9/10: Patient still having no pain at fracture site. Walking in regular shoes. Has occasional twinge in great toe only. Not taking any medications for pain.  Past Medical History  Diagnosis Date  . Hypertension   . Renal disorder   . Osteoporosis     Current Outpatient Prescriptions on File Prior to Visit  Medication Sig Dispense Refill  . ACTONEL 150 MG tablet       . alclomethasone (ACLOVATE) 0.05 % cream       . estradiol (ESTRACE) 0.5 MG tablet       . fenofibrate (TRICOR) 145 MG tablet Take 145 mg by mouth daily.      Marland Kitchen losartan (COZAAR) 25 MG tablet Take 25 mg by mouth daily.       No current facility-administered medications on file prior to visit.    Past Surgical History  Procedure Laterality Date  . Abdominal hysterectomy    .  Bladder surgery    . Anus surgery      Allergies  Allergen Reactions  . Codeine Other (See Comments)    Abd pain  . Erythromycin Other (See Comments)    Abd pain    History   Social History  . Marital Status: Married    Spouse Name: N/A    Number of Children: N/A  . Years of Education: N/A   Occupational History  . Not on file.   Social History Main Topics  . Smoking status: Never Smoker   . Smokeless tobacco: Not on file  . Alcohol Use: No  . Drug Use: No  . Sexual Activity: Not on file   Other Topics Concern  . Not on file   Social History Narrative  . No narrative on file    Family History  Problem Relation Age of Onset  . Heart attack Father   . Diabetes Neg Hx   . Hyperlipidemia Neg Hx   . Hypertension Neg Hx     BP 130/84  Pulse 67  Ht 5' (1.524 m)  Wt 110 lb (49.896 kg)  BMI 21.48 kg/m2  Review of Systems: See HPI above.    Objective:  Physical Exam:  Gen: NAD  R foot/ankle: Cast removed. No swelling, no bruising lateral foot.  No other  deformity. FROM ankle without pain, 5/5 strength. No TTP 5th metatarsal. Negative ant drawer and talar tilt.   Negative syndesmotic compression. NV intact distally.  Assessment & Plan:  1. Jones fracture - 8 weeks out from fracture - completed 6 weeks immobilization and radiographs/ultrasound showed callus formation.  Done very well past 2 weeks with start of ambulation.  At this point will discharge without restrictions.  F/u prn.

## 2012-09-18 ENCOUNTER — Telehealth: Payer: Self-pay | Admitting: *Deleted

## 2012-09-18 NOTE — Telephone Encounter (Signed)
Patient called and said that she is going on a trip and will be doing a lot of walking. [place] Spaniel that she is having a fair amount of swelling, redness and puffiness. Wants to know what she should do next (ace wrap, ankle brace).

## 2012-09-18 NOTE — Telephone Encounter (Signed)
As discussed the swelling is expected and usually takes months to resolve.  She should ice it, elevate it when possible, ACE wrap OR ankle brace would be fine.  But no restrictions on her walking in [place].

## 2014-03-18 IMAGING — CR DG FOOT COMPLETE 3+V*R*
3 series · 3 of 3 positions shown · non-contrast
Comparison: 07/14/2012

CLINICAL DATA: Follow up fifth metatarsal fracture

RIGHT FOOT COMPLETE - 3+ VIEW

[t foot ap right]
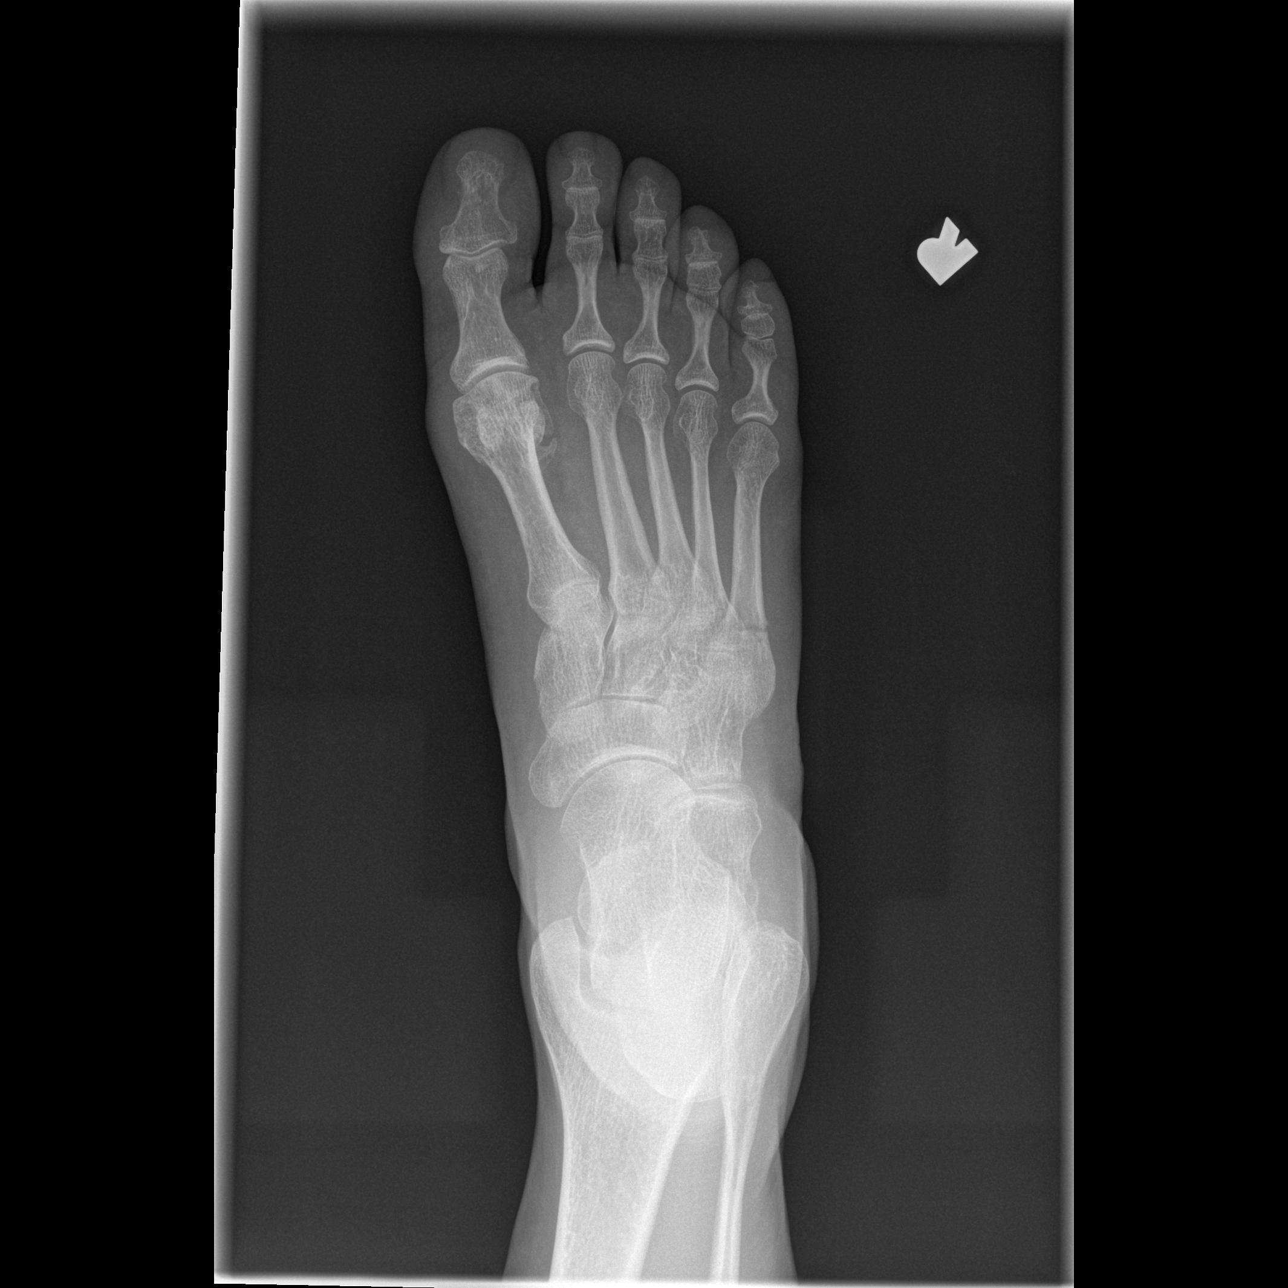

[t foot oblique right]
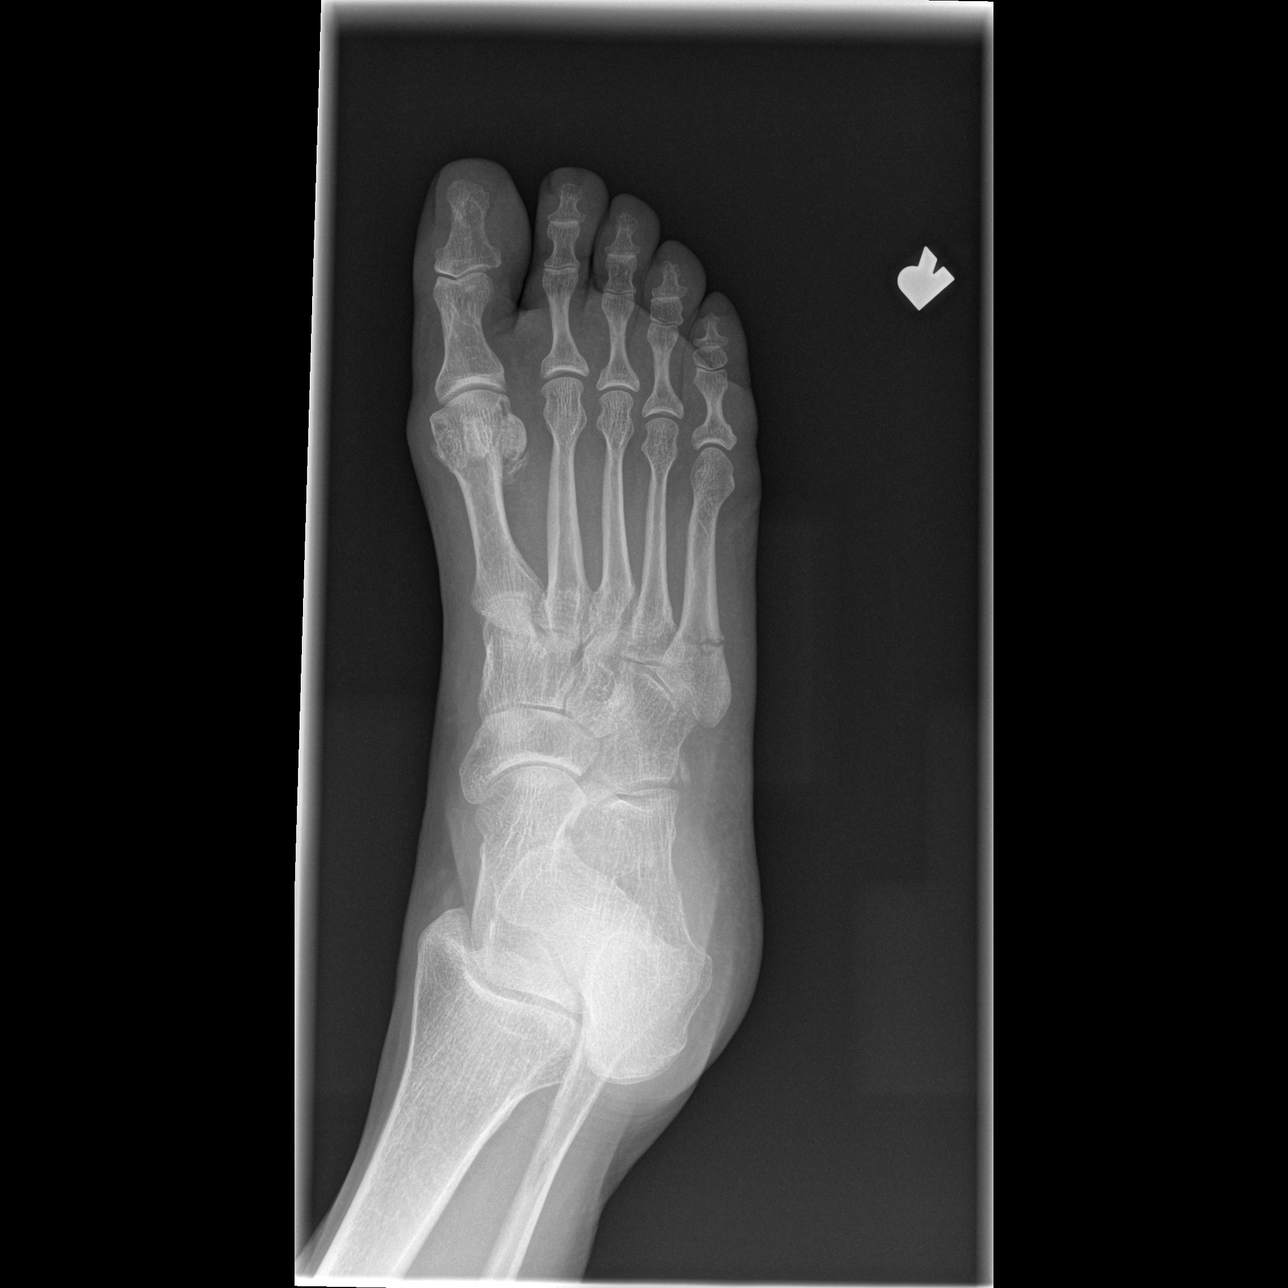

[t foot lat right]
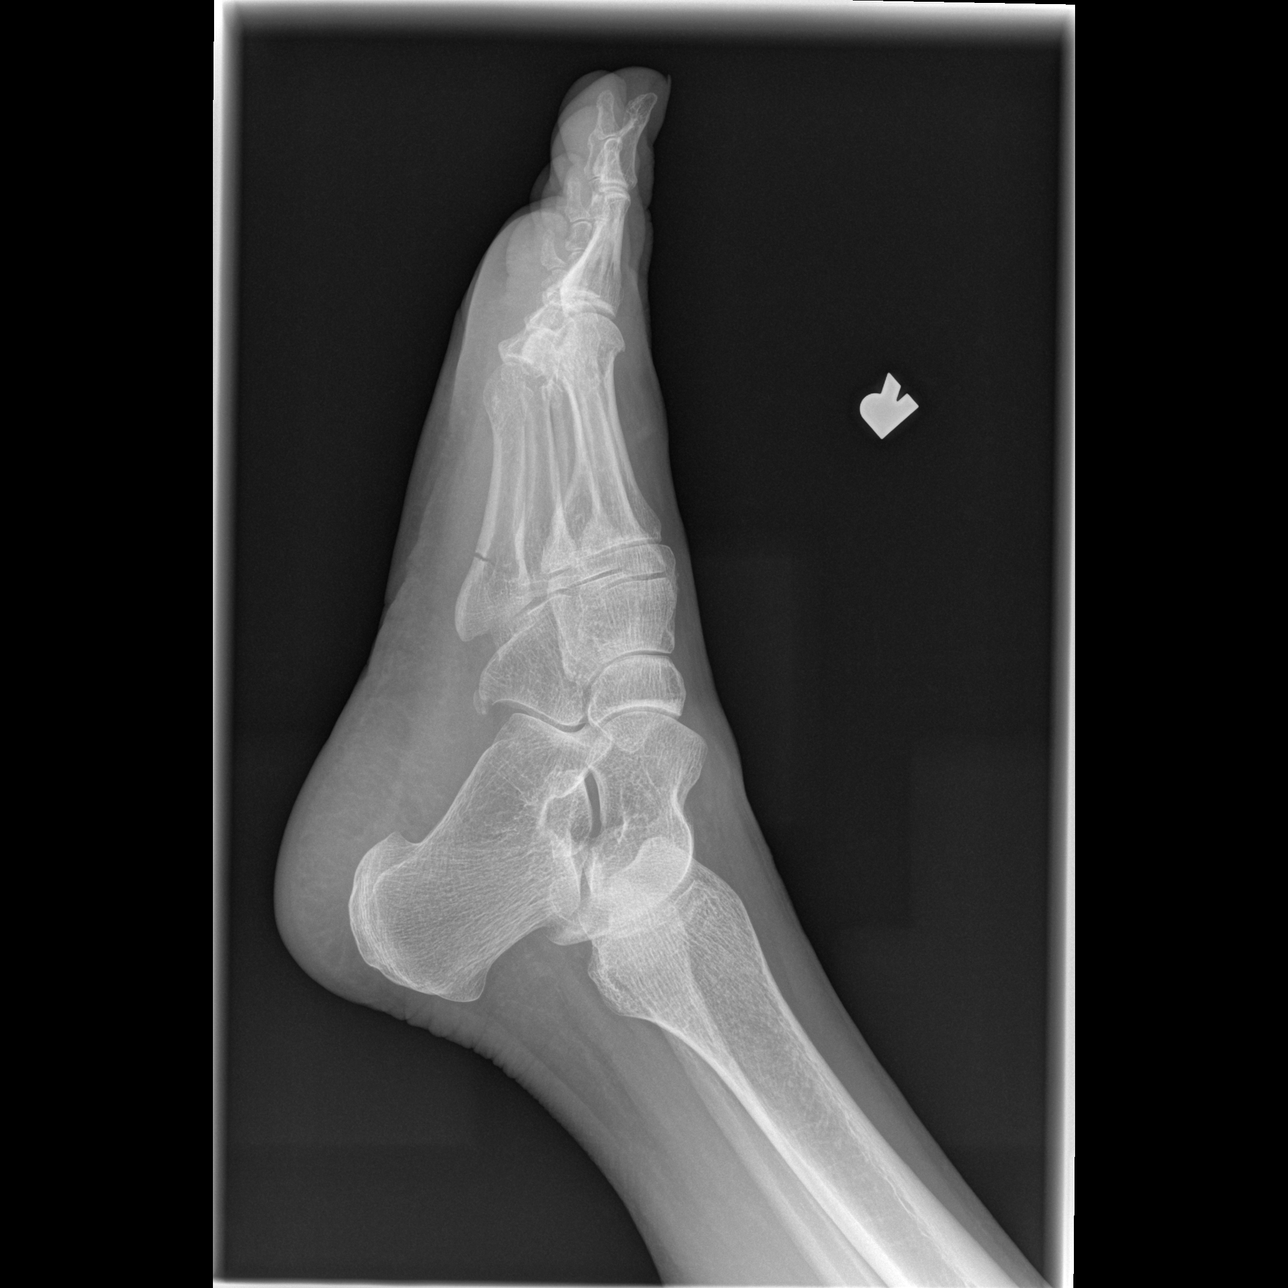

[3 of 3 positions shown; findings below may reference images not displayed]

FINDINGS: Three views of the right foot submitted.  Again noted nondisplaced
transverse fracture at the base of fifth metatarsal without
misalignment.  The fracture line is still visible without
significant callus formation.
IMPRESSION: Again noted nondisplaced transverse fracture at the base of fifth
metatarsal without misalignment.  The fracture line is still
visible without significant callus formation.]

## 2017-05-31 ENCOUNTER — Encounter: Payer: Self-pay | Admitting: Gastroenterology

## 2019-02-15 ENCOUNTER — Ambulatory Visit: Payer: Medicare PPO | Attending: Internal Medicine

## 2019-06-01 ENCOUNTER — Other Ambulatory Visit: Payer: Self-pay

## 2019-06-01 ENCOUNTER — Inpatient Hospital Stay (HOSPITAL_COMMUNITY): Payer: Medicare PPO

## 2019-06-01 ENCOUNTER — Encounter (HOSPITAL_COMMUNITY): Payer: Self-pay | Admitting: Internal Medicine

## 2019-06-01 ENCOUNTER — Inpatient Hospital Stay (HOSPITAL_COMMUNITY)
Admission: EM | Admit: 2019-06-01 | Discharge: 2019-06-07 | DRG: 481 | Disposition: A | Discharge status: Skilled Nursing Facility | Payer: Medicare PPO | Attending: Internal Medicine | Admitting: Internal Medicine

## 2019-06-01 ENCOUNTER — Emergency Department (HOSPITAL_COMMUNITY): Payer: Medicare PPO

## 2019-06-01 DIAGNOSIS — M5136 Other intervertebral disc degeneration, lumbar region: Secondary | ICD-10-CM | POA: Diagnosis present

## 2019-06-01 DIAGNOSIS — R52 Pain, unspecified: Secondary | ICD-10-CM

## 2019-06-01 DIAGNOSIS — Z01818 Encounter for other preprocedural examination: Secondary | ICD-10-CM

## 2019-06-01 DIAGNOSIS — I129 Hypertensive chronic kidney disease with stage 1 through stage 4 chronic kidney disease, or unspecified chronic kidney disease: Secondary | ICD-10-CM | POA: Diagnosis present

## 2019-06-01 DIAGNOSIS — D62 Acute posthemorrhagic anemia: Secondary | ICD-10-CM | POA: Diagnosis not present

## 2019-06-01 DIAGNOSIS — Z7982 Long term (current) use of aspirin: Secondary | ICD-10-CM

## 2019-06-01 DIAGNOSIS — W19XXXA Unspecified fall, initial encounter: Secondary | ICD-10-CM | POA: Diagnosis present

## 2019-06-01 DIAGNOSIS — M25552 Pain in left hip: Secondary | ICD-10-CM | POA: Diagnosis present

## 2019-06-01 DIAGNOSIS — N179 Acute kidney failure, unspecified: Secondary | ICD-10-CM | POA: Diagnosis present

## 2019-06-01 DIAGNOSIS — Y92830 Public park as the place of occurrence of the external cause: Secondary | ICD-10-CM | POA: Diagnosis not present

## 2019-06-01 DIAGNOSIS — F329 Major depressive disorder, single episode, unspecified: Secondary | ICD-10-CM | POA: Diagnosis present

## 2019-06-01 DIAGNOSIS — F4322 Adjustment disorder with anxiety: Secondary | ICD-10-CM | POA: Diagnosis present

## 2019-06-01 DIAGNOSIS — M5412 Radiculopathy, cervical region: Secondary | ICD-10-CM | POA: Diagnosis present

## 2019-06-01 DIAGNOSIS — I10 Essential (primary) hypertension: Secondary | ICD-10-CM | POA: Diagnosis present

## 2019-06-01 DIAGNOSIS — M810 Age-related osteoporosis without current pathological fracture: Secondary | ICD-10-CM | POA: Diagnosis present

## 2019-06-01 DIAGNOSIS — Z90710 Acquired absence of both cervix and uterus: Secondary | ICD-10-CM | POA: Diagnosis not present

## 2019-06-01 DIAGNOSIS — Z20822 Contact with and (suspected) exposure to covid-19: Secondary | ICD-10-CM | POA: Diagnosis present

## 2019-06-01 DIAGNOSIS — Z419 Encounter for procedure for purposes other than remedying health state, unspecified: Secondary | ICD-10-CM

## 2019-06-01 DIAGNOSIS — N183 Chronic kidney disease, stage 3 unspecified: Secondary | ICD-10-CM | POA: Diagnosis present

## 2019-06-01 DIAGNOSIS — N1831 Chronic kidney disease, stage 3a: Secondary | ICD-10-CM | POA: Diagnosis present

## 2019-06-01 DIAGNOSIS — S7292XA Unspecified fracture of left femur, initial encounter for closed fracture: Secondary | ICD-10-CM | POA: Diagnosis present

## 2019-06-01 DIAGNOSIS — S72332A Displaced oblique fracture of shaft of left femur, initial encounter for closed fracture: Secondary | ICD-10-CM

## 2019-06-01 DIAGNOSIS — W101XXA Fall (on)(from) sidewalk curb, initial encounter: Secondary | ICD-10-CM | POA: Diagnosis present

## 2019-06-01 DIAGNOSIS — Z79899 Other long term (current) drug therapy: Secondary | ICD-10-CM

## 2019-06-01 HISTORY — DX: Chronic kidney disease, stage 3 unspecified: N18.30

## 2019-06-01 LAB — CBC WITH DIFFERENTIAL/PLATELET
Abs Immature Granulocytes: 0.04 10*3/uL (ref 0.00–0.07)
Basophils Absolute: 0 10*3/uL (ref 0.0–0.1)
Basophils Relative: 1 %
Eosinophils Absolute: 0.1 10*3/uL (ref 0.0–0.5)
Eosinophils Relative: 1 %
HCT: 41.6 % (ref 36.0–46.0)
Hemoglobin: 13 g/dL (ref 12.0–15.0)
Immature Granulocytes: 1 %
Lymphocytes Relative: 13 %
Lymphs Abs: 0.7 10*3/uL (ref 0.7–4.0)
MCH: 27.6 pg (ref 26.0–34.0)
MCHC: 31.3 g/dL (ref 30.0–36.0)
MCV: 88.3 fL (ref 80.0–100.0)
Monocytes Absolute: 0.5 10*3/uL (ref 0.1–1.0)
Monocytes Relative: 8 %
Neutro Abs: 4.5 10*3/uL (ref 1.7–7.7)
Neutrophils Relative %: 76 %
Platelets: 394 10*3/uL (ref 150–400)
RBC: 4.71 MIL/uL (ref 3.87–5.11)
RDW: 15.6 % — ABNORMAL HIGH (ref 11.5–15.5)
WBC: 5.8 10*3/uL (ref 4.0–10.5)
nRBC: 0 % (ref 0.0–0.2)

## 2019-06-01 LAB — COMPREHENSIVE METABOLIC PANEL
ALT: 15 U/L (ref 0–44)
AST: 31 U/L (ref 15–41)
Albumin: 4 g/dL (ref 3.5–5.0)
Alkaline Phosphatase: 40 U/L (ref 38–126)
Anion gap: 15 (ref 5–15)
BUN: 33 mg/dL — ABNORMAL HIGH (ref 8–23)
CO2: 25 mmol/L (ref 22–32)
Calcium: 10.6 mg/dL — ABNORMAL HIGH (ref 8.9–10.3)
Chloride: 98 mmol/L (ref 98–111)
Creatinine, Ser: 1.54 mg/dL — ABNORMAL HIGH (ref 0.44–1.00)
GFR calc Af Amer: 40 mL/min — ABNORMAL LOW (ref >60)
GFR calc non Af Amer: 34 mL/min — ABNORMAL LOW (ref >60)
Glucose, Bld: 118 mg/dL — ABNORMAL HIGH (ref 70–99)
Potassium: 4.5 mmol/L (ref 3.5–5.1)
Sodium: 138 mmol/L (ref 135–145)
Total Bilirubin: 0.6 mg/dL (ref 0.3–1.2)
Total Protein: 7.5 g/dL (ref 6.5–8.1)

## 2019-06-01 LAB — SURGICAL PCR SCREEN
MRSA, PCR: NEGATIVE
Staphylococcus aureus: NEGATIVE

## 2019-06-01 LAB — TYPE AND SCREEN
ABO/RH(D): O POS
Antibody Screen: NEGATIVE

## 2019-06-01 LAB — SARS CORONAVIRUS 2 BY RT PCR (HOSPITAL ORDER, PERFORMED IN ~~LOC~~ HOSPITAL LAB): SARS Coronavirus 2: NEGATIVE

## 2019-06-01 LAB — CBG MONITORING, ED: Glucose-Capillary: 107 mg/dL — ABNORMAL HIGH (ref 70–99)

## 2019-06-01 MED ORDER — GABAPENTIN 100 MG PO CAPS
200.0000 mg | ORAL_CAPSULE | Freq: Every day | ORAL | Status: DC
  Administered 2019-06-01 – 2019-06-06 (×6): 200 mg via ORAL
  Filled 2019-06-01 (×6): qty 2

## 2019-06-01 MED ORDER — AMLODIPINE BESYLATE 5 MG PO TABS
5.0000 mg | ORAL_TABLET | Freq: Every day | ORAL | Status: DC
  Administered 2019-06-03 – 2019-06-07 (×5): 5 mg via ORAL
  Filled 2019-06-01 (×5): qty 1

## 2019-06-01 MED ORDER — METHOCARBAMOL 500 MG PO TABS
500.0000 mg | ORAL_TABLET | Freq: Two times a day (BID) | ORAL | Status: DC | PRN
  Administered 2019-06-01 – 2019-06-02 (×2): 500 mg via ORAL
  Filled 2019-06-01 (×3): qty 1

## 2019-06-01 MED ORDER — SERTRALINE HCL 50 MG PO TABS
50.0000 mg | ORAL_TABLET | Freq: Every day | ORAL | Status: DC
  Administered 2019-06-03 – 2019-06-07 (×5): 50 mg via ORAL
  Filled 2019-06-01 (×5): qty 1

## 2019-06-01 MED ORDER — METHOCARBAMOL 500 MG PO TABS: 500.0000 mg | ORAL_TABLET | Freq: Two times a day (BID) | ORAL | Status: DC

## 2019-06-01 MED ORDER — SENNOSIDES-DOCUSATE SODIUM 8.6-50 MG PO TABS: 1.0000 | ORAL_TABLET | Freq: Every evening | ORAL | Status: DC | PRN

## 2019-06-01 MED ORDER — SODIUM CHLORIDE 0.9 % IV SOLN: INTRAVENOUS | Status: AC

## 2019-06-01 MED ORDER — HYDROMORPHONE HCL 1 MG/ML IJ SOLN
0.5000 mg | INTRAMUSCULAR | Status: DC | PRN
  Administered 2019-06-02: 0.5 mg via INTRAVENOUS
  Filled 2019-06-01: qty 1

## 2019-06-01 MED ORDER — SODIUM CHLORIDE 0.9 % IV BOLUS
1000.0000 mL | Freq: Once | INTRAVENOUS | Status: AC
  Administered 2019-06-01: 1000 mL via INTRAVENOUS

## 2019-06-01 MED ORDER — OXYCODONE HCL 5 MG PO TABS
5.0000 mg | ORAL_TABLET | ORAL | Status: DC | PRN
  Administered 2019-06-01: 5 mg via ORAL
  Filled 2019-06-01: qty 1

## 2019-06-01 MED ORDER — ONDANSETRON HCL 4 MG/2ML IJ SOLN: 4.0000 mg | Freq: Four times a day (QID) | INTRAMUSCULAR | Status: DC | PRN

## 2019-06-01 MED ORDER — TEMAZEPAM 7.5 MG PO CAPS
15.0000 mg | ORAL_CAPSULE | Freq: Every day | ORAL | Status: DC
  Administered 2019-06-01 – 2019-06-06 (×6): 15 mg via ORAL
  Filled 2019-06-01 (×6): qty 2

## 2019-06-01 MED ORDER — MORPHINE SULFATE (PF) 4 MG/ML IV SOLN
4.0000 mg | Freq: Once | INTRAVENOUS | Status: AC
  Administered 2019-06-01: 4 mg via INTRAVENOUS
  Filled 2019-06-01: qty 1

## 2019-06-01 MED ORDER — ACETAMINOPHEN 500 MG PO TABS
1000.0000 mg | ORAL_TABLET | Freq: Four times a day (QID) | ORAL | Status: DC | PRN
  Filled 2019-06-01: qty 2

## 2019-06-01 NOTE — ED Triage Notes (Signed)
Pt bib ems from a park where pt tripped in a hole. Now with + deformity and angulation to L femur. Arrives in traction. CNS intact. 20g LAC 146/80 HR 70 RR 16 100% RA

## 2019-06-01 NOTE — H&P (Signed)
History and Physical    Julia Mora UMP:[id number] DOB: Jul 20, 1950 DOA: 06/01/2019  PCP: Tamsen Roers, MD  Patient coming from: Home  I have personally briefly reviewed patient's old medical records in [place]  Chief Complaint: Left hip pain  HPI: Julia Mora is a 69 y.o. female with medical history significant for hypertension, CKD stage III, anxiety, osteoporosis, cervical radiculopathy, lumbar degenerative disc disease who presents to the ED for evaluation of left hip pain. Patient states she was walking at the park when she misstepped into about a 1 foot deep hole. She got her leg twisted around and that she fell onto the sidewalk on her left hip. She had immediate pain and was unable to bear weight on her own. She says she was able to otherwise braced her fall with her arms and did not have any significant injury to her head. She did not lose consciousness. She denies any recent chest pain, palpitations, dyspnea, cough, abdominal pain, nausea, vomiting, or dysuria.  ED Course:  Initial vitals showed BP 161/94, pulse 86, RR 21, temp 98.8 Fahrenheit, SPO2 99% on room air.  Initial labs showed WBC 5.8, hemoglobin 13.9, platelets 394,000, sodium 138, potassium 4.5, bicarb 25, BUN 23, creatinine 1.54, serum glucose 118, calcium 10.6, LFTs within normal limits.  SARS-CoV-2 PCR is negative.  Left femur x-ray showed displaced and angulated fracture of the proximal femoral diaphysis.  Patient was given 1 L normal saline, 4 mg IV morphine.  EDP discussed the case with on-call orthopedics, Dr. Sharol Given, who recommended applying proximal traction, keeping n.p.o. at midnight for possible surgical intervention tomorrow.  The hospitalist service consulted to admit for further evaluation management.  Review of Systems: All systems reviewed and are negative except as documented in history of present illness above.   Past Medical History:  Diagnosis Date  . CKD (chronic kidney  disease) stage 3, GFR 30-59 ml/min   . Hypertension   . Osteoporosis     Past Surgical History:  Procedure Laterality Date  . ABDOMINAL HYSTERECTOMY    . ANUS SURGERY    . BLADDER SURGERY      Social History:  reports that she has never smoked. She does not have any smokeless tobacco history on file. She reports that she does not drink alcohol or use drugs.  Allergies  Allergen Reactions  . Codeine Other (See Comments)    Abd pain  . Erythromycin Other (See Comments)    Abd pain    Family History  Problem Relation Age of Onset  . Heart attack Father   . Diabetes Neg Hx   . Hyperlipidemia Neg Hx   . Hypertension Neg Hx      Prior to Admission medications   Medication Sig Start Date End Date Taking? Authorizing Provider  ACTONEL 150 MG tablet   anxiety, osteoporosis/26/14   [provider]  alclomethasone (ACLOVATE) 0.05 % cream  07/04/12   [provider]  estradiol (ESTRACE) 0.5 MG tablet  04/30/12   [provider]  fenofibrate (TRICOR) 145 MG tablet Take 145 mg by mouth daily.    [provider]  losartan (COZAAR) 25 MG tablet Take 25 mg by mouth daily.    [provider]    Physical Exam: Vitals:   06/01/19 1845 06/01/19 1900 06/01/19 1930 06/01/19 1959  BP:  (!) 161/94 (!) 150/88   Pulse: 74 82 79   Resp: 16 (!) 21 16 20   Temp:      SpO2:  100% 99% 100%   Weight:      Height:       Constitutional: Resting supine in bed, NAD, calm, comfortable Eyes: PERRL, lids and conjunctivae normal ENMT: Mucous membranes are moist. Posterior pharynx clear of any exudate or lesions. Neck: normal, supple, no masses. Respiratory: clear to auscultation anteriorly. Normal respiratory effort. No accessory muscle use.  Cardiovascular: Regular rate and rhythm, no murmurs / rubs / gallops. No extremity edema. 2+ pedal pulses. Abdomen: no tenderness, no masses palpated. No hepatosplenomegaly. Bowel sounds positive.  Musculoskeletal: no  clubbing / cyanosis. ROM LLE diminished due to fracture/pain. Buck's traction in place LLE. Skin: no rashes, lesions, ulcers. No induration Neurologic: CN 2-12 grossly intact. Sensation intact, Strength diminished LLE due to femoral fracture/pain otherwise intact other extremities. Psychiatric: Normal judgment and insight. Alert and oriented x 3. Normal mood.   Labs on Admission: I have personally reviewed following labs and imaging studies  CBC: Recent Labs  Lab 06/01/19 1845  WBC 5.8  NEUTROABS 4.5  HGB 13.0  HCT 41.6  MCV 88.3  PLT 394   Basic Metabolic Panel: Recent Labs  Lab 06/01/19 1845  NA 138  K 4.5  CL 98  CO2 25  GLUCOSE 118*  BUN 33*  CREATININE 1.54*  CALCIUM 10.6*   GFR: Estimated Creatinine Clearance: 25.8 mL/min (A) (by C-G formula based on SCr of 1.54 mg/dL (H)). Liver Function Tests: Recent Labs  Lab 06/01/19 1845  AST 31  ALT 15  ALKPHOS 40  BILITOT 0.6  PROT 7.5  ALBUMIN 4.0   No results for input(s): LIPASE, AMYLASE in the last 168 hours. No results for input(s): AMMONIA in the last 168 hours. Coagulation Profile: No results for input(s): INR, PROTIME in the last 168 hours. Cardiac Enzymes: No results for input(s): CKTOTAL, CKMB, CKMBINDEX, TROPONINI in the last 168 hours. BNP (last 3 results) No results for input(s): PROBNP in the last 8760 hours. HbA1C: No results for input(s): HGBA1C in the last 72 hours. CBG: Recent Labs  Lab 06/01/19 1828  GLUCAP 107*   Lipid Profile: No results for input(s): CHOL, HDL, LDLCALC, TRIG, CHOLHDL, LDLDIRECT in the last 72 hours. Thyroid Function Tests: No results for input(s): TSH, T4TOTAL, FREET4, T3FREE, THYROIDAB in the last 72 hours. Anemia Panel: No results for input(s): VITAMINB12, FOLATE, FERRITIN, TIBC, IRON, RETICCTPCT in the last 72 hours. Urine analysis: No results found for: COLORURINE, APPEARANCEUR, LABSPEC, PHURINE, GLUCOSEU, HGBUR, BILIRUBINUR, KETONESUR, PROTEINUR, UROBILINOGEN,  NITRITE, LEUKOCYTESUR  Radiological Exams on Admission: DG Femur Portable Min 2 Views Left  Result Date: 06/01/2019 CLINICAL DATA:  69 year old female with fall and left lower extremity deformity. EXAM: LEFT FEMUR PORTABLE 2 VIEWS COMPARISON:  None. FINDINGS: There is a displaced and angulated fracture of the proximal left femoral diaphysis. There is medial angulation of the distal fracture fragment and approximately 15 mm overlap. There is no dislocation. The bones are well mineralized. The soft tissues are grossly unremarkable. IMPRESSION: Displaced and angulated fracture of the proximal left femoral diaphysis. Electronically Signed   By: Elgie Collard M.D.   On: 06/01/2019 19:41   EKG: Pending.  Assessment/Plan Principal Problem:   Closed left femoral fracture (HCC) Active Problems:   Essential hypertension   CKD (chronic kidney disease) stage 3, GFR 30-59 ml/min  Julia Mora is a 69 y.o. female with medical history significant for hypertension, CKD stage III, anxiety, osteoporosis, cervical radiculopathy, lumbar degenerative disc disease who is admitted with a left femur fracture.   Displaced left  femur fracture: Occurring after mechanical fall. Orthopedics following, patient placed in Buck's traction and plan for surgical intervention tomorrow. Patient is considered low risk for perioperative adverse cardiac event. -Orthopedics following, plan for surgical intervention tomorrow -Buck's traction applied -N.p.o. at midnight -Continue analgesics as needed -Hold home aspirin for now  CKD stage III: Baseline creatinine 1.2-1.4. Creatinine on admission slightly increased from baseline at 1.54. Place on gentle IV fluid hydration overnight and repeat labs in a.m. Hold home losartan for now.  Hypertension: Currently stable. Resume home amlodipine, holding losartan for now as above.  Adjustment disorder/anxiety: Continue sertraline.  DVT prophylaxis: SCDs  Code Status: Full  code, confirmed with patient Family Communication: Discussed with patient's husband at bedside Disposition Plan: From home, discharge pending surgical intervention and postop recovery, PT/OT evaluation Consults called: Orthopedics Admission status:  Status is: Inpatient  Remains inpatient appropriate because:Ongoing active pain requiring inpatient pain management and Inpatient level of care appropriate due to severity of illness, orthopedic evaluation and surgical intervention.  Dispo: The patient is from: Home              Anticipated d/c is to: TBD pending surgical intervention, postop recovery, and PT/OT eval              Anticipated d/c date is: 2 days              Patient currently is not medically stable to d/c.   Darreld Mclean MD [facility]  If 7PM-7AM, please contact night-coverage [email or web address]  06/01/2019, 8:21 PM

## 2019-06-01 NOTE — ED Provider Notes (Signed)
[facility] EMERGENCY DEPARTMENT Provider Note   CSN: [id number] Arrival date & time: 06/01/19  1810     History Chief Complaint  Patient presents with  . Fall    Julia Mora is a 69 y.o. female.  HPI Patient is a 69 year old female with past medical history significant for hypertension and osteoporosis.  Patient is presented today with severe left leg pain and deformity that was severe, 10/10 pain but is now 2/10 after a total of 150 mcg of fentanyl by EMS in route.  Per EMS patient stepped into a 1 foot deep hole in the grass and fell forwards.  She had obvious deformity on initial evaluation she was placed in traction which somewhat improved pain as well as was given fentanyl.  She is hemodynamically stable and have distal pulses sensation and motor function throughout entirety of transportation.  Discussed with patient she states that she was simply walking when she fell into the hole.  She denies any loss of consciousness, dizziness, lightheadedness or headache.  She states she feels well apart from the pain in her left leg.  She denies any lower leg pain, back or neck pain.  Denies any chest pain or shortness of breath.      Past Medical History:  Diagnosis Date  . Hypertension   . Osteoporosis   . Renal disorder     Patient Active Problem List   Diagnosis Date Noted  . Left knee injury 08/21/2012  . Right foot pain 07/24/2012    Past Surgical History:  Procedure Laterality Date  . ABDOMINAL HYSTERECTOMY    . ANUS SURGERY    . BLADDER SURGERY       OB History   No obstetric history on file.     Family History  Problem Relation Age of Onset  . Heart attack Father   . Diabetes Neg Hx   . Hyperlipidemia Neg Hx   . Hypertension Neg Hx     Social History   Tobacco Use  . Smoking status: Never Smoker  Substance Use Topics  . Alcohol use: No  . Drug use: No    Home Medications Prior to Admission medications   Medication Sig  Start Date End Date Taking? Authorizing Provider  ACTONEL 150 MG tablet  06/28/12   [provider]  alclomethasone (ACLOVATE) 0.05 % cream  07/04/12   [provider]  estradiol (ESTRACE) 0.5 MG tablet  04/30/12   [provider]  fenofibrate (TRICOR) 145 MG tablet Take 145 mg by mouth daily.    [provider]  losartan (COZAAR) 25 MG tablet Take 25 mg by mouth daily.    [provider]    Allergies    Codeine and Erythromycin  Review of Systems   Review of Systems  Constitutional: Negative for chills and fever.  HENT: Negative for congestion.   Eyes: Negative for pain.  Respiratory: Negative for cough and shortness of breath.   Cardiovascular: Negative for chest pain and leg swelling.  Gastrointestinal: Negative for abdominal pain and vomiting.  Genitourinary: Negative for dysuria.  Musculoskeletal: Negative for myalgias.       Left leg pain  Skin: Negative for rash.  Neurological: Negative for dizziness and headaches.    Physical Exam Updated Vital Signs BP (!) 161/94   Pulse 82   Temp 98.8 F (37.1 C)   Resp (!) 21   Ht 4\' 11"  (1.499 m)   Wt 52.2 kg   SpO2 99%  BMI 23.23 kg/m   Physical Exam Vitals and nursing note reviewed.  Constitutional:      General: She is not in acute distress. HENT:     Head: Normocephalic and atraumatic.     Nose: Nose normal.     Mouth/Throat:     Mouth: Mucous membranes are moist.  Eyes:     General: No scleral icterus. Neck:     Comments: No midline neck tenderness to palpation. Cardiovascular:     Rate and Rhythm: Normal rate and regular rhythm.     Pulses: Normal pulses.     Heart sounds: Normal heart sounds.  Pulmonary:     Effort: Pulmonary effort is normal. No respiratory distress.     Breath sounds: No wheezing.     Comments: Breath sounds present in all fields. Abdominal:     Palpations: Abdomen is soft.     Tenderness: There is no abdominal tenderness.  Musculoskeletal:      Cervical back: Normal range of motion.     Right lower leg: No edema.     Left lower leg: No edema.     Comments: No midline back tenderness to palpation.  No bruising step-off or deformity.  Very significant tenderness to palpation of the left proximal femur.  There is obvious deformity.  Distal pulses are intact posterior tibial and dorsalis pedis bilaterally is 3+ and symmetric.  Patient is able to wiggle toes on both feet.  Is currently in traction.  Skin:    General: Skin is warm and dry.     Capillary Refill: Capillary refill takes less than 2 seconds.     Comments: No notable bruising of the left lower extremity.  Neurological:     Mental Status: She is alert. Mental status is at baseline.     Comments: Sensation is intact in both feet  Mental status: A&O X3.  Sensation intact in all extremities and face.  Cranial nerves grossly intact.  No upper extremity weakness.  Good coordination of upper extremities.  Psychiatric:        Mood and Affect: Mood normal.        Behavior: Behavior normal.     ED Results / Procedures / Treatments   Labs (all labs ordered are listed, but only abnormal results are displayed) Labs Reviewed  CBC WITH DIFFERENTIAL/PLATELET - Abnormal; Notable for the following components:      Result Value   RDW 15.6 (*)    All other components within normal limits  COMPREHENSIVE METABOLIC PANEL - Abnormal; Notable for the following components:   Glucose, Bld 118 (*)    BUN 33 (*)    Creatinine, Ser 1.54 (*)    Calcium 10.6 (*)    GFR calc non Af Amer 34 (*)    GFR calc Af Amer 40 (*)    All other components within normal limits  CBG MONITORING, ED - Abnormal; Notable for the following components:   Glucose-Capillary 107 (*)    All other components within normal limits  SARS CORONAVIRUS 2 BY RT PCR (HOSPITAL ORDER, PERFORMED IN [facility])    EKG None  Radiology DG Femur Portable Min 2 Views Left  Result Date: 06/01/2019 CLINICAL  DATA:  69 year old female with fall and left lower extremity deformity. EXAM: LEFT FEMUR PORTABLE 2 VIEWS COMPARISON:  None. FINDINGS: There is a displaced and angulated fracture of the proximal left femoral diaphysis. There is medial angulation of the distal fracture fragment and approximately 15 mm overlap.  There is no dislocation. The bones are well mineralized. The soft tissues are grossly unremarkable. IMPRESSION: Displaced and angulated fracture of the proximal left femoral diaphysis. Electronically Signed   By: Elgie Collard M.D.   On: 06/01/2019 19:41    Procedures .Critical Care Performed by: Gailen Shelter, PA Authorized by: Gailen Shelter, PA   Critical care provider statement:    Critical care time (minutes):  41   Critical care was necessary to treat or prevent imminent or life-threatening deterioration of the following conditions: Trauma, femur fracture.   Critical care was time spent personally by me on the following activities:  Discussions with consultants, evaluation of patient's response to treatment, examination of patient, ordering and performing treatments and interventions, ordering and review of laboratory studies, ordering and review of radiographic studies, pulse oximetry, re-evaluation of patient's condition, obtaining history from patient or surrogate and review of old charts   (including critical care time)  Medications Ordered in ED Medications  morphine 4 MG/ML injection 4 mg (has no administration in time range)  sodium chloride 0.9 % bolus 1,000 mL (1,000 mLs Intravenous New Bag/Given 06/01/19 1949)    ED Course  I have reviewed the triage vital signs and the nursing notes.  Pertinent labs & imaging results that were available during my care of the patient were reviewed by me and considered in my medical decision making (see chart for details).    MDM Rules/Calculators/A&P                      Patient is a 69 year old female with a history of  hypertension, osteoporosis presented today with left proximal leg pain that began abruptly after she fell into a 1 foot hole just prior to arrival.  She was seen by EMS and transported around.  She is placed in traction due to left proximal femur deformity/swelling.  Pulses were intact distally throughout transported hemodynamically stable.  Well-appearing and mentating well.  She denies any pain elsewhere other than her leg.  She denies significant head injury although she states she does not member what happened after falling forwards she denies any headache or dizziness or lightheadedness.  She states that the fall was mechanical but due to the whole.  She denies any symptoms prior to fall.  Physical exam notable for left proximal leg deformity.  She has intact pulses and sensation is able to wiggle toes.  She is in traction currently.  Vital signs are within normal limits apart from very mild hypertension.  Pain is well controlled after 150 mcg of fentanyl in route by EMS.  She is mentating well.  We will continue to monitor while awaiting orthopedic consultation.  X-ray does confirm firm a proximal left femoral shaft fracture that appears either oblique or transverse and is displaced 1 with of the femoral shaft with the proximal femur displaced laterally.  7:55 PM husband at bedside.  Updated on x-ray confirmation of femur fracture.  Orthopedics has been consulted.  Radiology read on fracture is pending.  EKG, Covid, CBC and CMP ordered for surgical clearance.  I discussed this case with my attending physician who cosigned this note including patient's presenting symptoms, physical exam, and planned diagnostics and interventions. Attending physician stated agreement with plan or made changes to plan which were implemented.   CMP without acute electrolyte abnormality.  She does have mildly elevated BUN and creatinine.  Not significant for severe AKI however she does appear somewhat prerenal.  Will  provide patient with 1 L normal saline.  CBC without leukocytosis or anemia.  Covid test pending at this time.  EKG also pending.  Discussed case with Dr. Sharol Given to who requests 10 pounds of Buck traction be applied, n.p.o. after midnight and will do surgery tomorrow morning.  Patient redose of morphine for pain control and will be admitted to hospitalist service.  EKG without acute ischemia.  No acute abnormalities.  Final Clinical Impression(s) / ED Diagnoses Final diagnoses:  Closed displaced oblique fracture of shaft of left femur, initial encounter [facility])    Rx / [facility] Orders ED Discharge Orders    None       Tedd Sias, [place] 06/01/19 Karl Bales    Virgel Manifold, MD 06/02/19 [phone]

## 2019-06-01 NOTE — ED Notes (Signed)
Paged ortho tech to apply bucks traction

## 2019-06-02 ENCOUNTER — Inpatient Hospital Stay (HOSPITAL_COMMUNITY): Payer: Medicare PPO | Admitting: Certified Registered"

## 2019-06-02 ENCOUNTER — Encounter (HOSPITAL_COMMUNITY): Payer: Self-pay | Admitting: Internal Medicine

## 2019-06-02 ENCOUNTER — Inpatient Hospital Stay (HOSPITAL_COMMUNITY): Payer: Medicare PPO

## 2019-06-02 ENCOUNTER — Encounter (HOSPITAL_COMMUNITY)
Admission: EM | Disposition: A | Discharge status: Skilled Nursing Facility | Payer: Self-pay | Source: Home / Self Care | Attending: Family Medicine

## 2019-06-02 DIAGNOSIS — W19XXXA Unspecified fall, initial encounter: Secondary | ICD-10-CM

## 2019-06-02 DIAGNOSIS — S72332A Displaced oblique fracture of shaft of left femur, initial encounter for closed fracture: Principal | ICD-10-CM

## 2019-06-02 HISTORY — PX: FEMUR IM NAIL: SHX1597

## 2019-06-02 LAB — CBC
HCT: 35.1 % — ABNORMAL LOW (ref 36.0–46.0)
Hemoglobin: 11.1 g/dL — ABNORMAL LOW (ref 12.0–15.0)
MCH: 28 pg (ref 26.0–34.0)
MCHC: 31.6 g/dL (ref 30.0–36.0)
MCV: 88.6 fL (ref 80.0–100.0)
Platelets: 339 10*3/uL (ref 150–400)
RBC: 3.96 MIL/uL (ref 3.87–5.11)
RDW: 15.7 % — ABNORMAL HIGH (ref 11.5–15.5)
WBC: 7.9 10*3/uL (ref 4.0–10.5)
nRBC: 0 % (ref 0.0–0.2)

## 2019-06-02 LAB — ABO/RH: ABO/RH(D): O POS

## 2019-06-02 LAB — VITAMIN D 25 HYDROXY (VIT D DEFICIENCY, FRACTURES): Vit D, 25-Hydroxy: 44.71 ng/mL (ref 30–100)

## 2019-06-02 LAB — BASIC METABOLIC PANEL
Anion gap: 8 (ref 5–15)
BUN: 24 mg/dL — ABNORMAL HIGH (ref 8–23)
CO2: 23 mmol/L (ref 22–32)
Calcium: 9.3 mg/dL (ref 8.9–10.3)
Chloride: 107 mmol/L (ref 98–111)
Creatinine, Ser: 1.16 mg/dL — ABNORMAL HIGH (ref 0.44–1.00)
GFR calc Af Amer: 56 mL/min — ABNORMAL LOW (ref >60)
GFR calc non Af Amer: 48 mL/min — ABNORMAL LOW (ref >60)
Glucose, Bld: 132 mg/dL — ABNORMAL HIGH (ref 70–99)
Potassium: 4.3 mmol/L (ref 3.5–5.1)
Sodium: 138 mmol/L (ref 135–145)

## 2019-06-02 LAB — HIV ANTIBODY (ROUTINE TESTING W REFLEX): HIV Screen 4th Generation wRfx: NONREACTIVE

## 2019-06-02 SURGERY — INSERTION, INTRAMEDULLARY ROD, FEMUR
Anesthesia: General | Site: Leg Upper | Laterality: Left

## 2019-06-02 MED ORDER — CHLORHEXIDINE GLUCONATE 4 % EX LIQD: 60.0000 mL | Freq: Once | CUTANEOUS | Status: DC

## 2019-06-02 MED ORDER — CEFAZOLIN SODIUM-DEXTROSE 1-4 GM/50ML-% IV SOLN
1.0000 g | Freq: Four times a day (QID) | INTRAVENOUS | Status: AC
  Administered 2019-06-02 – 2019-06-03 (×3): 1 g via INTRAVENOUS
  Filled 2019-06-02 (×3): qty 50

## 2019-06-02 MED ORDER — LIDOCAINE 2% (20 MG/ML) 5 ML SYRINGE
INTRAMUSCULAR | Status: AC
  Filled 2019-06-02: qty 5

## 2019-06-02 MED ORDER — CHLORHEXIDINE GLUCONATE 4 % EX LIQD
Freq: Once | CUTANEOUS | Status: DC
  Filled 2019-06-02: qty 60

## 2019-06-02 MED ORDER — ROCURONIUM BROMIDE 10 MG/ML (PF) SYRINGE
PREFILLED_SYRINGE | INTRAVENOUS | Status: AC
  Filled 2019-06-02: qty 10

## 2019-06-02 MED ORDER — METHOCARBAMOL 500 MG PO TABS
500.0000 mg | ORAL_TABLET | Freq: Four times a day (QID) | ORAL | Status: DC | PRN
  Administered 2019-06-03: 500 mg via ORAL

## 2019-06-02 MED ORDER — FENTANYL CITRATE (PF) 100 MCG/2ML IJ SOLN: 25.0000 ug | INTRAMUSCULAR | Status: DC | PRN

## 2019-06-02 MED ORDER — METHOCARBAMOL 1000 MG/10ML IJ SOLN
500.0000 mg | Freq: Four times a day (QID) | INTRAVENOUS | Status: DC | PRN
  Filled 2019-06-02: qty 5

## 2019-06-02 MED ORDER — ACETAMINOPHEN 500 MG PO TABS
1000.0000 mg | ORAL_TABLET | Freq: Once | ORAL | Status: AC
  Administered 2019-06-02: 1000 mg via ORAL

## 2019-06-02 MED ORDER — HYDROCODONE-ACETAMINOPHEN 7.5-325 MG PO TABS: 1.0000 | ORAL_TABLET | ORAL | Status: DC | PRN

## 2019-06-02 MED ORDER — LACTATED RINGERS IV SOLN: INTRAVENOUS | Status: DC | PRN

## 2019-06-02 MED ORDER — LIDOCAINE HCL (CARDIAC) PF 100 MG/5ML IV SOSY: PREFILLED_SYRINGE | INTRAVENOUS | Status: DC | PRN

## 2019-06-02 MED ORDER — 0.9 % SODIUM CHLORIDE (POUR BTL) OPTIME
TOPICAL | Status: DC | PRN
Start: 2019-06-02 — End: 2019-06-02
  Administered 2019-06-02: 1000 mL

## 2019-06-02 MED ORDER — ENSURE PRE-SURGERY PO LIQD
296.0000 mL | Freq: Once | ORAL | Status: AC
  Administered 2019-06-02: 296 mL via ORAL
  Filled 2019-06-02: qty 296

## 2019-06-02 MED ORDER — MIDAZOLAM HCL 5 MG/5ML IJ SOLN
INTRAMUSCULAR | Status: DC | PRN
  Administered 2019-06-02: 2 mg via INTRAVENOUS

## 2019-06-02 MED ORDER — MIDAZOLAM HCL 2 MG/2ML IJ SOLN
INTRAMUSCULAR | Status: AC
  Filled 2019-06-02: qty 2

## 2019-06-02 MED ORDER — ACETAMINOPHEN 325 MG PO TABS
325.0000 mg | ORAL_TABLET | Freq: Four times a day (QID) | ORAL | Status: DC | PRN
  Filled 2019-06-02: qty 2

## 2019-06-02 MED ORDER — LIDOCAINE 2% (20 MG/ML) 5 ML SYRINGE
INTRAMUSCULAR | Status: DC | PRN
  Administered 2019-06-02: 60 mg via INTRAVENOUS

## 2019-06-02 MED ORDER — FENTANYL CITRATE (PF) 250 MCG/5ML IJ SOLN
INTRAMUSCULAR | Status: AC
  Filled 2019-06-02: qty 5

## 2019-06-02 MED ORDER — PROMETHAZINE HCL 25 MG/ML IJ SOLN: 6.2500 mg | INTRAMUSCULAR | Status: DC | PRN

## 2019-06-02 MED ORDER — SODIUM CHLORIDE 0.9 % IV SOLN: INTRAVENOUS | Status: DC

## 2019-06-02 MED ORDER — ONDANSETRON HCL 4 MG PO TABS: 4.0000 mg | ORAL_TABLET | Freq: Four times a day (QID) | ORAL | Status: DC | PRN

## 2019-06-02 MED ORDER — PHENYLEPHRINE HCL-NACL 10-0.9 MG/250ML-% IV SOLN
INTRAVENOUS | Status: DC | PRN
Start: 2019-06-02 — End: 2019-06-02
  Administered 2019-06-02: 25 ug/min via INTRAVENOUS

## 2019-06-02 MED ORDER — HYDROCODONE-ACETAMINOPHEN 5-325 MG PO TABS
1.0000 | ORAL_TABLET | ORAL | Status: DC | PRN
  Administered 2019-06-02 – 2019-06-03 (×2): 1 via ORAL
  Administered 2019-06-03 – 2019-06-04 (×5): 2 via ORAL
  Administered 2019-06-05: 1 via ORAL
  Administered 2019-06-05: 2 via ORAL
  Administered 2019-06-06 (×2): 1 via ORAL
  Administered 2019-06-06 – 2019-06-07 (×2): 2 via ORAL
  Filled 2019-06-02: qty 2
  Filled 2019-06-02 (×2): qty 1
  Filled 2019-06-02: qty 2
  Filled 2019-06-02: qty 1
  Filled 2019-06-02 (×3): qty 2
  Filled 2019-06-02 (×2): qty 1
  Filled 2019-06-02 (×4): qty 2

## 2019-06-02 MED ORDER — DEXAMETHASONE SODIUM PHOSPHATE 10 MG/ML IJ SOLN
INTRAMUSCULAR | Status: DC | PRN
  Administered 2019-06-02: 4 mg via INTRAVENOUS

## 2019-06-02 MED ORDER — METOCLOPRAMIDE HCL 5 MG PO TABS: 5.0000 mg | ORAL_TABLET | Freq: Three times a day (TID) | ORAL | Status: DC | PRN

## 2019-06-02 MED ORDER — SUGAMMADEX SODIUM 200 MG/2ML IV SOLN
INTRAVENOUS | Status: DC | PRN
Start: 2019-06-02 — End: 2019-06-02
  Administered 2019-06-02: 200 mg via INTRAVENOUS

## 2019-06-02 MED ORDER — DOCUSATE SODIUM 100 MG PO CAPS
100.0000 mg | ORAL_CAPSULE | Freq: Two times a day (BID) | ORAL | Status: DC
  Administered 2019-06-02 – 2019-06-07 (×10): 100 mg via ORAL
  Filled 2019-06-02 (×10): qty 1

## 2019-06-02 MED ORDER — PROPOFOL 10 MG/ML IV BOLUS
INTRAVENOUS | Status: AC
  Filled 2019-06-02: qty 20

## 2019-06-02 MED ORDER — ROCURONIUM BROMIDE 100 MG/10ML IV SOLN
INTRAVENOUS | Status: DC | PRN
  Administered 2019-06-02: 60 mg via INTRAVENOUS

## 2019-06-02 MED ORDER — POVIDONE-IODINE 10 % EX SWAB: 2.0000 "application " | Freq: Once | CUTANEOUS | Status: DC

## 2019-06-02 MED ORDER — ONDANSETRON HCL 4 MG/2ML IJ SOLN: 4.0000 mg | Freq: Four times a day (QID) | INTRAMUSCULAR | Status: DC | PRN

## 2019-06-02 MED ORDER — METOCLOPRAMIDE HCL 5 MG/ML IJ SOLN: 5.0000 mg | Freq: Three times a day (TID) | INTRAMUSCULAR | Status: DC | PRN

## 2019-06-02 MED ORDER — BISACODYL 10 MG RE SUPP: 10.0000 mg | Freq: Every day | RECTAL | Status: DC | PRN

## 2019-06-02 MED ORDER — LIDOCAINE-EPINEPHRINE (PF) 2 %-1:200000 IJ SOLN
INTRAMUSCULAR | Status: AC
  Filled 2019-06-02: qty 20

## 2019-06-02 MED ORDER — CEFAZOLIN SODIUM-DEXTROSE 2-4 GM/100ML-% IV SOLN
2.0000 g | INTRAVENOUS | Status: AC
  Administered 2019-06-02: 2 g via INTRAVENOUS
  Filled 2019-06-02: qty 100

## 2019-06-02 MED ORDER — ASPIRIN EC 325 MG PO TBEC
325.0000 mg | DELAYED_RELEASE_TABLET | Freq: Once | ORAL | Status: AC
  Administered 2019-06-02: 325 mg via ORAL
  Filled 2019-06-02: qty 1

## 2019-06-02 MED ORDER — MORPHINE SULFATE (PF) 2 MG/ML IV SOLN: 0.5000 mg | INTRAVENOUS | Status: DC | PRN

## 2019-06-02 MED ORDER — POLYETHYLENE GLYCOL 3350 17 G PO PACK: 17.0000 g | PACK | Freq: Every day | ORAL | Status: DC | PRN

## 2019-06-02 MED ORDER — PROPOFOL 10 MG/ML IV BOLUS
INTRAVENOUS | Status: DC | PRN
  Administered 2019-06-02: 100 mg via INTRAVENOUS

## 2019-06-02 MED ORDER — MAGNESIUM CITRATE PO SOLN: 1.0000 | Freq: Once | ORAL | Status: DC | PRN

## 2019-06-02 MED ORDER — FENTANYL CITRATE (PF) 100 MCG/2ML IJ SOLN
INTRAMUSCULAR | Status: DC | PRN
  Administered 2019-06-02 (×2): 50 ug via INTRAVENOUS
  Administered 2019-06-02: 100 ug via INTRAVENOUS

## 2019-06-02 MED ORDER — PHENYLEPHRINE 40 MCG/ML (10ML) SYRINGE FOR IV PUSH (FOR BLOOD PRESSURE SUPPORT)
PREFILLED_SYRINGE | INTRAVENOUS | Status: AC
  Filled 2019-06-02: qty 10

## 2019-06-02 SURGICAL SUPPLY — 33 items
BLADE SURG 15 STRL LF DISP TIS (BLADE) ×1 IMPLANT
BLADE SURG 15 STRL SS (BLADE) ×1
COVER BACK TABLE 60X90IN (DRAPES) ×2 IMPLANT
COVER PERINEAL POST (MISCELLANEOUS) ×2 IMPLANT
COVER SURGICAL LIGHT HANDLE (MISCELLANEOUS) ×4 IMPLANT
DRAPE C-ARM 42X72 X-RAY (DRAPES) ×2 IMPLANT
DRSG ADAPTIC 3X8 NADH LF (GAUZE/BANDAGES/DRESSINGS) ×2 IMPLANT
DRSG MEPILEX BORD LITE 2X5 (GAUZE/BANDAGES/DRESSINGS) ×2 IMPLANT
DRSG MEPILEX BORDER 4X4 (GAUZE/BANDAGES/DRESSINGS) ×2 IMPLANT
DRSG MEPILEX BORDER 4X8 (GAUZE/BANDAGES/DRESSINGS) ×2 IMPLANT
ELECT REM PT RETURN 9FT ADLT (ELECTROSURGICAL) ×2
ELECTRODE REM PT RTRN 9FT ADLT (ELECTROSURGICAL) ×1 IMPLANT
GLOVE BIOGEL PI IND STRL 9 (GLOVE) ×2 IMPLANT
GLOVE BIOGEL PI INDICATOR 9 (GLOVE) ×2
GLOVE SURG ORTHO 9.0 STRL STRW (GLOVE) ×4 IMPLANT
GOWN STRL REUS W/ TWL XL LVL3 (GOWN DISPOSABLE) ×3 IMPLANT
GOWN STRL REUS W/TWL XL LVL3 (GOWN DISPOSABLE) ×3
GUIDEWIRE 3.2X400 (WIRE) ×2 IMPLANT
KIT BASIN OR (CUSTOM PROCEDURE TRAY) ×2 IMPLANT
KIT TURNOVER KIT B (KITS) ×2 IMPLANT
NAIL CANN TFNA 9MM/130 360MM (Nail) ×2 IMPLANT
NS IRRIG 1000ML POUR BTL (IV SOLUTION) ×2 IMPLANT
PACK GENERAL/GYN (CUSTOM PROCEDURE TRAY) ×2 IMPLANT
PAD ARMBOARD 7.5X6 YLW CONV (MISCELLANEOUS) ×4 IMPLANT
REAMER ROD DEEP FLUTE 2.5X950 (INSTRUMENTS) ×2 IMPLANT
SCREW 75 TFNA FENESTRAT (Screw) ×1 IMPLANT
SCREW BN 3.5X75X10.35XCANN (Screw) ×1 IMPLANT
STAPLER VISISTAT 35W (STAPLE) ×2 IMPLANT
SUT VIC AB 1 CT1 27 (SUTURE) ×1
SUT VIC AB 1 CT1 27XBRD ANBCTR (SUTURE) ×1 IMPLANT
SUT VIC AB 2-0 CTB1 (SUTURE) IMPLANT
TRAY FOLEY MTR SLVR 16FR STAT (SET/KITS/TRAYS/PACK) ×2 IMPLANT
WATER STERILE IRR 1000ML POUR (IV SOLUTION) ×4 IMPLANT

## 2019-06-02 NOTE — Anesthesia Procedure Notes (Signed)
Procedure Name: Intubation Date/Time: 06/02/2019 11:00 AM Performed by: Lavell Luster, CRNA Pre-anesthesia Checklist: Patient identified, Emergency Drugs available, Suction available, Patient being monitored and Timeout performed Patient Re-evaluated:Patient Re-evaluated prior to induction Oxygen Delivery Method: Circle system utilized Preoxygenation: Pre-oxygenation with 100% oxygen Induction Type: IV induction Ventilation: Mask ventilation without difficulty Laryngoscope Size: Mac and 4 Grade View: Grade I Tube type: Oral Tube size: 7.0 mm Number of attempts: 1 Airway Equipment and Method: Stylet Placement Confirmation: ETT inserted through vocal cords under direct vision,  positive ETCO2 and breath sounds checked- equal and bilateral Secured at: 21 cm Tube secured with: Tape Dental Injury: Teeth and Oropharynx as per pre-operative assessment

## 2019-06-02 NOTE — Anesthesia Postprocedure Evaluation (Signed)
Anesthesia Post Note  Patient: YESENNIA HIROTA  Procedure(s) Performed: INTRAMEDULLARY (IM) NAIL FEMORAL (Left Leg Upper)     Patient location during evaluation: PACU Anesthesia Type: General Level of consciousness: sedated Pain management: pain level controlled Vital Signs Assessment: post-procedure vital signs reviewed and stable Respiratory status: spontaneous breathing and respiratory function stable Cardiovascular status: stable Postop Assessment: no apparent nausea or vomiting Anesthetic complications: no    Last Vitals:  Vitals:   06/02/19 1255 06/02/19 1320  BP:  (!) 101/59  Pulse:  69  Resp:  15  Temp:  36.8 C  SpO2: (P) 93% 94%    Last Pain:  Vitals:   06/02/19 1320  TempSrc: Oral  PainSc:                  Elise Knobloch DANIEL

## 2019-06-02 NOTE — Op Note (Signed)
06/02/2019  12:16 PM  PATIENT:  Julia Mora    PRE-OPERATIVE DIAGNOSIS:  Fractured Left Femur  POST-OPERATIVE DIAGNOSIS:  Same  PROCEDURE:  INTRAMEDULLARY (IM) NAIL FEMORAL  SURGEON:  Nadara Mustard, MD  PHYSICIAN ASSISTANT:None ANESTHESIA:   General  PREOPERATIVE INDICATIONS:  Julia Mora is a  69 y.o. female with a diagnosis of Fractured Left Femur who failed conservative measures and elected for surgical management.    The risks benefits and alternatives were discussed with the patient preoperatively including but not limited to the risks of infection, bleeding, nerve injury, cardiopulmonary complications, the need for revision surgery, among others, and the patient was willing to proceed.  OPERATIVE IMPLANTS: Synthes femoral nail 9 x 360 mm locked proximally  @ENCIMAGES @  OPERATIVE FINDINGS: C-arm fluoroscopy verified alignment in both AP and lateral planes.  OPERATIVE PROCEDURE: Patient was brought the operating room and underwent a general anesthetic.  After adequate levels anesthesia were obtained patient was placed supine on the [facility] fracture table the left lower extremity was placed in boot traction the fracture was reduced with the C arm in the right lower extremity was padded and secured to the operating table.  The left lower extremity was prepped using DuraPrep draped into a sterile field with a shower curtain.  A timeout was called.  A incision was made just proximal to greater trochanter a guidewire was inserted through the greater trochanter into the shaft C-arm Crosby verified alignment of both AP and lateral planes this was overreamed.  The reduction tool was then placed the fracture reduced and a guidewire was inserted across the fracture.  Starting with a 8.5 reamer this was reamed from 8.5 up to 10.549 mm nail.  The 9 x 360 mm nail was inserted and using retrograde technique a guidewire was inserted into the femoral head just inferior to center center and a  75 mm screw was inserted.  C-arm fluoroscopy verified alignment.  This screw was then compressed the jig was removed see arthroscopy verified reduction of the fracture site the rotation was checked.  The wounds were irrigated with normal saline subcu was closed using 0 Vicryl the skin was closed using staples Mepilex dressing was applied patient was extubated taken the PACU in stable condition.   DISCHARGE PLANNING:  Antibiotic duration: 24 hours antibiotics  Weightbearing: Weightbearing as tolerated  Pain medication: Opioid pathway  Dressing care/ Wound VAC: Change dressings as needed  Ambulatory devices: Walker  Discharge to: Home or skilled nursing pending recommendations from physical therapy  Follow-up: In the office 1 week post operative.

## 2019-06-02 NOTE — Consult Note (Signed)
ORTHOPAEDIC CONSULTATION  REQUESTING PHYSICIAN: Hughie Closs, MD  Chief Complaint: Left femur fracture  HPI: Julia Mora is a 69 y.o. female who presents with a junction of the proximal third middle third left femur fracture.  Patient states she was at the park fell off a curb landed into a hole sustaining the femur fracture.  Patient states she has a history of osteoporosis.  Patient is not a smoker.  Past Medical History:  Diagnosis Date  . CKD (chronic kidney disease) stage 3, GFR 30-59 ml/min   . Hypertension   . Osteoporosis    Past Surgical History:  Procedure Laterality Date  . ABDOMINAL HYSTERECTOMY    . ANUS SURGERY    . BLADDER SURGERY     Social History   Socioeconomic History  . Marital status: Married    Spouse name: Not on file  . Number of children: Not on file  . Years of education: Not on file  . Highest education level: Not on file  Occupational History  . Not on file  Tobacco Use  . Smoking status: Never Smoker  Substance and Sexual Activity  . Alcohol use: No  . Drug use: No  . Sexual activity: Not on file  Other Topics Concern  . Not on file  Social History Narrative  . Not on file   Social Determinants of Health   Financial Resource Strain:   . Difficulty of Paying Living Expenses:   Food Insecurity:   . Worried About Programme researcher, broadcasting/film/video in the Last Year:   . Barista in the Last Year:   Transportation Needs:   . Freight forwarder (Medical):   Marland Kitchen Lack of Transportation (Non-Medical):   Physical Activity:   . Days of Exercise per Week:   . Minutes of Exercise per Session:   Stress:   . Feeling of Stress :   Social Connections:   . Frequency of Communication with Friends and Family:   . Frequency of Social Gatherings with Friends and Family:   . Attends Religious Services:   . Active Member of Clubs or Organizations:   . Attends Banker Meetings:   Marland Kitchen Marital Status:    Family History  Problem  Relation Age of Onset  . Heart attack Father   . Diabetes Neg Hx   . Hyperlipidemia Neg Hx   . Hypertension Neg Hx    - negative except otherwise stated in the family history section Allergies  Allergen Reactions  . Codeine Other (See Comments)    Abd pain  . Erythromycin Other (See Comments)    Abd pain   Prior to Admission medications   Medication Sig Start Date End Date Taking? Authorizing Provider  acetaminophen (TYLENOL) 500 MG tablet Take 500 mg by mouth every 6 (six) hours as needed for headache (pain).   Yes [provider]  amLODipine (NORVASC) 5 MG tablet Take 5 mg by mouth daily. 05/06/19  Yes [provider]  aspirin EC 81 MG tablet Take 81 mg by mouth daily.   Yes [provider]  Cholecalciferol (VITAMIN D3) 50 MCG (2000 UT) TABS Take 2,000 Units by mouth daily.   Yes [provider]  fenofibrate (TRICOR) 145 MG tablet Take 145 mg by mouth daily.   Yes [provider]  fexofenadine (ALLEGRA) 180 MG tablet Take 180 mg by mouth daily.   Yes [provider]  fluticasone (FLONASE) 50 MCG/ACT nasal spray Place 1 spray into  both nostrils daily.    Yes [provider]  gabapentin (NEURONTIN) 100 MG capsule Take 200 mg by mouth at bedtime. 01/03/19  Yes [provider]  guaiFENesin (MUCINEX) 600 MG 12 hr tablet Take 600 mg by mouth daily.   Yes [provider]  losartan (COZAAR) 50 MG tablet Take 50 mg by mouth 2 (two) times daily. 02/24/19  Yes [provider]  Magnesium 250 MG TABS Take 250 mg by mouth at bedtime.   Yes [provider]  methocarbamol (ROBAXIN) 500 MG tablet Take 500 mg by mouth 2 (two) times daily. 05/23/19  Yes [provider]  Multiple Vitamins-Minerals (PRESERVISION AREDS 2) CAPS Take 1 capsule by mouth 2 (two) times daily.   Yes [provider]  OVER THE COUNTER MEDICATION Take 1 tablet by mouth daily. Citracal plus D3 and zinc  - Calcium 650 mg,  Vitamin D3 1000 units, Zinc 5.5 mg   Yes [provider]  sertraline (ZOLOFT) 50 MG tablet Take 50 mg by mouth daily. 01/04/19  Yes [provider]  temazepam (RESTORIL) 15 MG capsule Take 15 mg by mouth at bedtime. 05/14/19  Yes [provider]  zoledronic acid (RECLAST) 5 MG/100ML SOLN injection Inject 5 mg into the vein See admin instructions. Administered yearly at [facility]. 02/15/19  Yes [provider]   Chest Portable 1 View  Result Date: 06/01/2019 CLINICAL DATA:  Preoperative examination. EXAM: PORTABLE CHEST 1 VIEW COMPARISON:  None. FINDINGS: The heart size and mediastinal contours are within normal limits. Both lungs are clear. The visualized skeletal structures are unremarkable. IMPRESSION: No active disease. Electronically Signed   By: Gerome Sam III M.D   On: 06/01/2019 21:49   DG Femur Portable Min 2 Views Left  Result Date: 06/01/2019 CLINICAL DATA:  69 year old female with fall and left lower extremity deformity. EXAM: LEFT FEMUR PORTABLE 2 VIEWS COMPARISON:  None. FINDINGS: There is a displaced and angulated fracture of the proximal left femoral diaphysis. There is medial angulation of the distal fracture fragment and approximately 15 mm overlap. There is no dislocation. The bones are well mineralized. The soft tissues are grossly unremarkable. IMPRESSION: Displaced and angulated fracture of the proximal left femoral diaphysis. Electronically Signed   By: Elgie Collard M.D.   On: 06/01/2019 19:41   - pertinent xrays, CT, MRI studies were reviewed and independently interpreted  Positive ROS: All other systems have been reviewed and were otherwise negative with the exception of those mentioned in the HPI and as above.  Physical Exam: General: Alert, no acute distress Psychiatric: Patient is competent for consent with normal mood and affect Lymphatic: No axillary or cervical lymphadenopathy Cardiovascular: No pedal  edema Respiratory: No cyanosis, no use of accessory musculature GI: No organomegaly, abdomen is soft and non-tender    Images:  @ENCIMAGES @  Labs:  No results found for: HGBA1C, ESRSEDRATE, CRP, LABURIC, REPTSTATUS, GRAMSTAIN, CULT, LABORGA  Lab Results  Component Value Date   ALBUMIN 4.0 06/01/2019   ALBUMIN 3.5 07/18/2007    Neurologic: Patient does not have protective sensation bilateral lower extremities.   MUSCULOSKELETAL:   Skin: Examination patient skin is intact no abrasions.  Patient's left lower extremity is shortened and externally rotated.  Patient has a good palpable dorsalis pedis pulse.  Patient has active plantarflexion and dorsiflexion of her ankle and toes.  Review of the radiographs shows a fracture of the femur the junction of the proximal and middle third left femur.  Assessment: Assessment: Proximal  left femur fracture.  Plan: Plan: We will plan for intramedullary nail fixation.  Risk and benefits were discussed including infection neurovascular injury DVT pulmonary embolus need for additional surgery.  Patient states she understands wished to proceed at this time discussed that she can be weightbearing as tolerated.  Discussed that progression with therapy will depend on discharge to home with home health therapy versus discharge to skilled nursing.  Thank you for the consult and the opportunity to see Ms. [place], MD [facility] [phone] 8:47 AM

## 2019-06-02 NOTE — Progress Notes (Signed)
PROGRESS NOTE    STORY CONTI  RWE:[id number] DOB: Jun 15, 1950 DOA: 06/01/2019 PCP: Aida Puffer, MD   Brief Narrative:  HPI: Julia Mora is a 69 y.o. female with medical history significant for hypertension, CKD stage III, anxiety, osteoporosis, cervical radiculopathy, lumbar degenerative disc disease who presents to the ED for evaluation of left hip pain. Patient states she was walking at the park when she misstepped into about a 1 foot deep hole. She got her leg twisted around and that she fell onto the sidewalk on her left hip. She had immediate pain and was unable to bear weight on her own. She says she was able to otherwise braced her fall with her arms and did not have any significant injury to her head. She did not lose consciousness. She denies any recent chest pain, palpitations, dyspnea, cough, abdominal pain, nausea, vomiting, or dysuria.  ED Course:  Initial vitals showed BP 161/94, pulse 86, RR 21, temp 98.8 Fahrenheit, SPO2 99% on room air.  Initial labs showed WBC 5.8, hemoglobin 13.9, platelets 394,000, sodium 138, potassium 4.5, bicarb 25, BUN 23, creatinine 1.54, serum glucose 118, calcium 10.6, LFTs within normal limits.  SARS-CoV-2 PCR is negative.  Left femur x-ray showed displaced and angulated fracture of the proximal femoral diaphysis.  Patient was given 1 L normal saline, 4 mg IV morphine.  EDP discussed the case with on-call orthopedics, Dr. Lajoyce Corners, who recommended applying proximal traction, keeping n.p.o. at midnight for possible surgical intervention tomorrow.  The hospitalist service consulted to admit for further evaluation management.  Assessment & Plan:   Principal Problem:   Closed left femoral fracture (HCC) Active Problems:   Essential hypertension   CKD (chronic kidney disease) stage 3, GFR 30-59 ml/min  Displaced left femur fracture: Occurring after mechanical fall. Orthopedics following, patient placed in Buck's traction.  Now status  post surgical repair.  Management per orthopedics.  AKI CKD stage IIIa: Baseline creatinine 1-1.2. Creatinine on admission slightly increased from baseline at 1.54. Placed on gentle IV fluid hydration overnight.  Creatinine improved.  Now back to baseline.  Essential hypertension: Currently slightly lower after surgery but was higher before surgery.  Continue morphine but continue to hold losartan.  Adjustment disorder/anxiety: Continue sertraline.   DVT prophylaxis: Per orthopedics   Code Status: Full Code  Family Communication: Husband and sister-in-law present at bedside.  Plan of care discussed with patient in length and he verbalized understanding and agreed with it. Patient is from: Home Disposition Plan: To be determined however most likely SNF Barriers to discharge: Just had the surgery today  Status is: Inpatient  Remains inpatient appropriate because:Inpatient level of care appropriate due to severity of illness   Dispo: The patient is from: Home              Anticipated d/c is to: SNF              Anticipated d/c date is: 2 days              Patient currently is not medically stable to d/c.         Estimated body mass index is 23.23 kg/m as calculated from the following:   Height as of this encounter: 4\' 11"  (1.499 m).   Weight as of this encounter: 52.2 kg.      Nutritional status:               Consultants:   Orthopedics  Procedures:   Left intramedullary femoral nail  Antimicrobials:  Anti-infectives (From admission, onward)   Start     Dose/Rate Route Frequency Ordered Stop   06/02/19 0745  ceFAZolin (ANCEF) IVPB 2g/100 mL premix     2 g 200 mL/hr over 30 Minutes Intravenous On call to O.R. 06/02/19 0739 06/02/19 1105         Subjective: Seen and examined after the surgery.  Husband and sister-in-law at the bedside.  Patient feels well.  No complaints.  Objective: Vitals:   06/02/19 0529 06/02/19 0613 06/02/19 0744  06/02/19 0833  BP: (!) 138/91 (!) 130/91 (!) 154/93   Pulse: (!) 103 92 89   Resp: 14  15   Temp: 98.6 F (37 C) 98.9 F (37.2 C) 98.2 F (36.8 C)   TempSrc: Oral Oral Oral   SpO2: 98% 96% 98%   Weight:    52.2 kg  Height:    4\' 11"  (1.499 m)   No intake or output data in the 24 hours ending 06/02/19 1155 Filed Weights   06/01/19 1826 06/02/19 0833  Weight: 52.2 kg 52.2 kg    Examination:  General exam: Appears calm and comfortable  Respiratory system: Clear to auscultation. Respiratory effort normal. Cardiovascular system: S1 & S2 heard, RRR. No JVD, murmurs, rubs, gallops or clicks. No pedal edema. Gastrointestinal system: Abdomen is nondistended, soft and nontender. No organomegaly or masses felt. Normal bowel sounds heard. Central nervous system: Alert and oriented. No focal neurological deficits. Skin: No rashes, lesions or ulcers Psychiatry: Judgement and insight appear normal. Mood & affect appropriate.    Data Reviewed: I have personally reviewed following labs and imaging studies  CBC: Recent Labs  Lab 06/01/19 1845 06/02/19 0335  WBC 5.8 7.9  NEUTROABS 4.5  --   HGB 13.0 11.1*  HCT 41.6 35.1*  MCV 88.3 88.6  PLT 394 384   Basic Metabolic Panel: Recent Labs  Lab 06/01/19 1845 06/02/19 0335  NA 138 138  K 4.5 4.3  CL 98 107  CO2 25 23  GLUCOSE 118* 132*  BUN 33* 24*  CREATININE 1.54* 1.16*  CALCIUM 10.6* 9.3   GFR: Estimated Creatinine Clearance: 34.3 mL/min (A) (by C-G formula based on SCr of 1.16 mg/dL (H)). Liver Function Tests: Recent Labs  Lab 06/01/19 1845  AST 31  ALT 15  ALKPHOS 40  BILITOT 0.6  PROT 7.5  ALBUMIN 4.0   No results for input(s): LIPASE, AMYLASE in the last 168 hours. No results for input(s): AMMONIA in the last 168 hours. Coagulation Profile: No results for input(s): INR, PROTIME in the last 168 hours. Cardiac Enzymes: No results for input(s): CKTOTAL, CKMB, CKMBINDEX, TROPONINI in the last 168 hours. BNP  (last 3 results) No results for input(s): PROBNP in the last 8760 hours. HbA1C: No results for input(s): HGBA1C in the last 72 hours. CBG: Recent Labs  Lab 06/01/19 1828  GLUCAP 107*   Lipid Profile: No results for input(s): CHOL, HDL, LDLCALC, TRIG, CHOLHDL, LDLDIRECT in the last 72 hours. Thyroid Function Tests: No results for input(s): TSH, T4TOTAL, FREET4, T3FREE, THYROIDAB in the last 72 hours. Anemia Panel: No results for input(s): VITAMINB12, FOLATE, FERRITIN, TIBC, IRON, RETICCTPCT in the last 72 hours. Sepsis Labs: No results for input(s): PROCALCITON, LATICACIDVEN in the last 168 hours.  Recent Results (from the past 240 hour(s))  SARS Coronavirus 2 by RT PCR (hospital order, performed in [facility] hospital lab) Nasopharyngeal Nasopharyngeal Swab     Status: None   Collection Time: 06/01/19  6:28 PM  Specimen: Nasopharyngeal Swab  Result Value Ref Range Status   SARS Coronavirus 2 NEGATIVE NEGATIVE Final    Comment: (NOTE) SARS-CoV-2 target nucleic acids are NOT DETECTED. The SARS-CoV-2 RNA is generally detectable in upper and lower respiratory specimens during the acute phase of infection. The lowest concentration of SARS-CoV-2 viral copies this assay can detect is 250 copies / mL. A negative result does not preclude SARS-CoV-2 infection and should not be used as the sole basis for treatment or other patient management decisions.  A negative result may occur with improper specimen collection / handling, submission of specimen other than nasopharyngeal swab, presence of viral mutation(s) within the areas targeted by this assay, and inadequate number of viral copies (<250 copies / mL). A negative result must be combined with clinical observations, patient history, and epidemiological information. Fact Sheet for Patients:   BoilerBrush.com.cy Fact Sheet for Healthcare Providers: https://pope.com/ This test is not yet  approved or cleared  by the [place] FDA and has been authorized for detection and/or diagnosis of SARS-CoV-2 by FDA under an Emergency Use Authorization (EUA).  This EUA will remain in effect (meaning this test can be used) for the duration of the COVID-19 declaration under Section 564(b)(1) of the Act, 21 U.S.C. section 360bbb-3(b)(1), unless the authorization is terminated or revoked sooner. Performed at [facility], [address], [place], [place] [address]   Surgical pcr screen     Status: None   Collection Time: 06/01/19  9:24 PM   Specimen: Nasal Mucosa; Nasal Swab  Result Value Ref Range Status   MRSA, PCR NEGATIVE NEGATIVE Final   Staphylococcus aureus NEGATIVE NEGATIVE Final    Comment: (NOTE) The Xpert SA Assay (FDA approved for NASAL specimens in patients 55 years of age and older), is one component of a comprehensive surveillance program. It is not intended to diagnose infection nor to guide or monitor treatment. Performed at [facility], [address] [place], [place] [address]       Radiology Studies: Chest Portable 1 View  Result Date: 06/01/2019 CLINICAL DATA:  Preoperative examination. EXAM: PORTABLE CHEST 1 VIEW COMPARISON:  None. FINDINGS: The heart size and mediastinal contours are within normal limits. Both lungs are clear. The visualized skeletal structures are unremarkable. IMPRESSION: No active disease. Electronically Signed   By: Gerome Sam III M.D   On: 06/01/2019 21:49   DG Femur Portable Min 2 Views Left  Result Date: 06/01/2019 CLINICAL DATA:  69 year old female with fall and left lower extremity deformity. EXAM: LEFT FEMUR PORTABLE 2 VIEWS COMPARISON:  None. FINDINGS: There is a displaced and angulated fracture of the proximal left femoral diaphysis. There is medial angulation of the distal fracture fragment and approximately 15 mm overlap. There is no dislocation. The bones are well mineralized. The soft tissues are grossly  unremarkable. IMPRESSION: Displaced and angulated fracture of the proximal left femoral diaphysis. Electronically Signed   By: Elgie Collard M.D.   On: 06/01/2019 19:41    Scheduled Meds: . [MAR Hold] amLODipine  5 mg Oral Daily  . chlorhexidine  60 mL Topical Once  . [MAR Hold] gabapentin  200 mg Oral QHS  . povidone-iodine  2 application Topical Once  . [MAR Hold] sertraline  50 mg Oral Daily  . [MAR Hold] temazepam  15 mg Oral QHS   Continuous Infusions:   LOS: 1 day   Time spent: 30 minutes   Hughie Closs, MD [facility]  06/02/2019, 11:55 AM  To contact the attending provider between 7A-7P or the covering provider during after hours 7P-7A, please log into the web site [email or web address].

## 2019-06-02 NOTE — Progress Notes (Signed)
Orthopedic Tech Progress Note Patient Details:  Julia Mora [birth year] [id number]  Musculoskeletal Traction Type of Traction: Bucks Skin Traction Traction Location: lle. Traction Weight: 10 lbs  I removed the hare traction and applied the bucks. Post Interventions Patient Tolerated: Well Instructions Provided: Care of device, Adjustment of device   Trinna Post 06/02/2019, 2:42 AM

## 2019-06-02 NOTE — Anesthesia Preprocedure Evaluation (Signed)
Anesthesia Evaluation  Patient identified by MRN, date of birth, ID band Patient awake    Reviewed: Allergy & Precautions, NPO status , Patient's Chart, lab work & pertinent test results  Airway Mallampati: II  TM Distance: >3 FB Neck ROM: Full    Dental no notable dental hx. (+) Dental Advisory Given   Pulmonary neg pulmonary ROS,    Pulmonary exam normal        Cardiovascular hypertension, Pt. on medications Normal cardiovascular exam     Neuro/Psych negative neurological ROS  negative psych ROS   GI/Hepatic negative GI ROS, Neg liver ROS,   Endo/Other  negative endocrine ROS  Renal/GU Renal InsufficiencyRenal disease  negative genitourinary   Musculoskeletal negative musculoskeletal ROS (+)   Abdominal   Peds negative pediatric ROS (+)  Hematology negative hematology ROS (+)   Anesthesia Other Findings   Reproductive/Obstetrics negative OB ROS                             Anesthesia Physical Anesthesia Plan  ASA: III  Anesthesia Plan: General   Post-op Pain Management:    Induction: Intravenous  PONV Risk Score and Plan: 4 or greater and Ondansetron, Dexamethasone and Midazolam  Airway Management Planned: Oral ETT  Additional Equipment:   Intra-op Plan:   Post-operative Plan: Extubation in OR  Informed Consent: I have reviewed the patients History and Physical, chart, labs and discussed the procedure including the risks, benefits and alternatives for the proposed anesthesia with the patient or authorized representative who has indicated his/her understanding and acceptance.     Dental advisory given  Plan Discussed with: Anesthesiologist and CRNA  Anesthesia Plan Comments:         Anesthesia Quick Evaluation

## 2019-06-02 NOTE — Transfer of Care (Signed)
Immediate Anesthesia Transfer of Care Note  Patient: Julia Mora  Procedure(s) Performed: INTRAMEDULLARY (IM) NAIL FEMORAL (Left Leg Upper)  Patient Location: PACU  Anesthesia Type:General  Level of Consciousness: awake, alert  and oriented  Airway & Oxygen Therapy: Patient connected to face mask oxygen  Post-op Assessment: Post -op Vital signs reviewed and stable  Post vital signs: stable  Last Vitals:  Vitals Value Taken Time  BP 106/77 06/02/19 1226  Temp    Pulse 66 06/02/19 1226  Resp 18 06/02/19 1226  SpO2 100 % 06/02/19 1226  Vitals shown include unvalidated device data.  Last Pain:  Vitals:   06/02/19 0832  TempSrc:   PainSc: 2       Patients Stated Pain Goal: 2 (06/02/19 7322)  Complications: No apparent anesthesia complications

## 2019-06-03 LAB — CBC WITH DIFFERENTIAL/PLATELET
Abs Immature Granulocytes: 0.04 10*3/uL (ref 0.00–0.07)
Basophils Absolute: 0 10*3/uL (ref 0.0–0.1)
Basophils Relative: 0 %
Eosinophils Absolute: 0.1 10*3/uL (ref 0.0–0.5)
Eosinophils Relative: 2 %
HCT: 25.3 % — ABNORMAL LOW (ref 36.0–46.0)
Hemoglobin: 8 g/dL — ABNORMAL LOW (ref 12.0–15.0)
Immature Granulocytes: 1 %
Lymphocytes Relative: 10 %
Lymphs Abs: 0.7 10*3/uL (ref 0.7–4.0)
MCH: 28.5 pg (ref 26.0–34.0)
MCHC: 31.6 g/dL (ref 30.0–36.0)
MCV: 90 fL (ref 80.0–100.0)
Monocytes Absolute: 0.7 10*3/uL (ref 0.1–1.0)
Monocytes Relative: 9 %
Neutro Abs: 5.8 10*3/uL (ref 1.7–7.7)
Neutrophils Relative %: 78 %
Platelets: 235 10*3/uL (ref 150–400)
RBC: 2.81 MIL/uL — ABNORMAL LOW (ref 3.87–5.11)
RDW: 15.7 % — ABNORMAL HIGH (ref 11.5–15.5)
WBC: 7.4 10*3/uL (ref 4.0–10.5)
nRBC: 0 % (ref 0.0–0.2)

## 2019-06-03 LAB — BASIC METABOLIC PANEL
Anion gap: 11 (ref 5–15)
BUN: 22 mg/dL (ref 8–23)
CO2: 21 mmol/L — ABNORMAL LOW (ref 22–32)
Calcium: 8.2 mg/dL — ABNORMAL LOW (ref 8.9–10.3)
Chloride: 102 mmol/L (ref 98–111)
Creatinine, Ser: 1.27 mg/dL — ABNORMAL HIGH (ref 0.44–1.00)
GFR calc Af Amer: 50 mL/min — ABNORMAL LOW (ref >60)
GFR calc non Af Amer: 43 mL/min — ABNORMAL LOW (ref >60)
Glucose, Bld: 123 mg/dL — ABNORMAL HIGH (ref 70–99)
Potassium: 3.7 mmol/L (ref 3.5–5.1)
Sodium: 134 mmol/L — ABNORMAL LOW (ref 135–145)

## 2019-06-03 MED ORDER — ASPIRIN EC 325 MG PO TBEC
325.0000 mg | DELAYED_RELEASE_TABLET | Freq: Every day | ORAL | 0 refills | Status: DC
Start: 2019-06-03 — End: 2021-10-14

## 2019-06-03 MED ORDER — OCUVITE-LUTEIN PO CAPS
1.0000 | ORAL_CAPSULE | Freq: Two times a day (BID) | ORAL | Status: DC
  Filled 2019-06-03 (×2): qty 1

## 2019-06-03 MED ORDER — MAGNESIUM 200 MG PO TABS
200.0000 mg | ORAL_TABLET | Freq: Every day | ORAL | Status: DC
  Filled 2019-06-03: qty 1

## 2019-06-03 MED ORDER — FENOFIBRATE 160 MG PO TABS
160.0000 mg | ORAL_TABLET | Freq: Every day | ORAL | Status: DC
  Administered 2019-06-03 – 2019-06-07 (×5): 160 mg via ORAL
  Filled 2019-06-03 (×5): qty 1

## 2019-06-03 MED ORDER — VITAMIN D 25 MCG (1000 UNIT) PO TABS
2000.0000 [IU] | ORAL_TABLET | Freq: Every day | ORAL | Status: DC
  Administered 2019-06-03 – 2019-06-07 (×5): 2000 [IU] via ORAL
  Filled 2019-06-03 (×8): qty 2

## 2019-06-03 MED ORDER — ACETAMINOPHEN 500 MG PO TABS: 500.0000 mg | ORAL_TABLET | Freq: Four times a day (QID) | ORAL | 0 refills | Status: AC | PRN

## 2019-06-03 MED ORDER — FLUTICASONE PROPIONATE 50 MCG/ACT NA SUSP
1.0000 | Freq: Every day | NASAL | Status: DC
  Administered 2019-06-04 – 2019-06-07 (×4): 1 via NASAL
  Filled 2019-06-03: qty 16

## 2019-06-03 MED ORDER — PROSIGHT PO TABS
1.0000 | ORAL_TABLET | Freq: Every day | ORAL | Status: DC
  Administered 2019-06-04 – 2019-06-07 (×4): 1 via ORAL
  Filled 2019-06-03 (×4): qty 1

## 2019-06-03 MED ORDER — ASPIRIN EC 81 MG PO TBEC
81.0000 mg | DELAYED_RELEASE_TABLET | Freq: Every day | ORAL | Status: DC
  Administered 2019-06-03 – 2019-06-07 (×5): 81 mg via ORAL
  Filled 2019-06-03 (×5): qty 1

## 2019-06-03 MED ORDER — MAGNESIUM OXIDE 400 (241.3 MG) MG PO TABS
200.0000 mg | ORAL_TABLET | Freq: Every day | ORAL | Status: DC
  Administered 2019-06-03 – 2019-06-06 (×4): 200 mg via ORAL
  Filled 2019-06-03 (×4): qty 1

## 2019-06-03 NOTE — Plan of Care (Signed)

## 2019-06-03 NOTE — Progress Notes (Signed)
PROGRESS NOTE    Julia Mora  KTG:[id number] DOB: December 05, 1950 DOA: 06/01/2019 PCP: Aida Puffer, MD   Brief Narrative:   Julia Mora is a 69 y.o. female with medical history significant for hypertension, CKD stage III, anxiety, osteoporosis, cervical radiculopathy, lumbar degenerative disc disease who presented to the ED for evaluation of left hip pain. Patient states [place] the park [place] she misstepped into about a 1 foot deep hole. She got her leg twisted around and that she fell onto the sidewalk on her left hip. She had immediate pain and was unable to bear weight on her own. She did not lose consciousness.  Upon arrival to ED, she was hemodynamically stable.  Left femur x-ray showed displaced and angulated fracture of the proximal femoral diaphysis.  She was admitted to hospital service.  Orthopedics consulted.  She underwent intramedullary nailing left femur on 06/02/2019.  Assessment & Plan:   Principal Problem:   Closed left femoral fracture (HCC) Active Problems:   Essential hypertension   CKD (chronic kidney disease) stage 3, GFR 30-59 ml/min   Fall  Displaced left femur fracture: Occurring after mechanical fall. Orthopedics following, patient placed in Buck's traction.  Now status post surgical repair.  Management per orthopedics.  AKI CKD stage IIIa: Baseline creatinine 1-1.2. Creatinine on admission slightly increased from baseline at 1.54. Placed on gentle IV fluid hydration overnight.  Creatinine improved.  Now back to baseline.  Morning labs pending for today.  Essential hypertension: Low normal. Continue amlodipine but continue to hold losartan.  Adjustment disorder/anxiety: Continue sertraline.  Left foot pain/sprain: On examination it is nontender.  She walked without any pain.  Doubt any fracture.   DVT prophylaxis: Per orthopedics.  It looks like she received 1 dose of aspirin 325 mg yesterday postoperatively.  She is not on any DVT  prophylaxis currently.  I have sent a message to Dr. Lajoyce Corners about his recommendations.  In the meantime, I will resume her aspirin 81 mg from home.   Code Status: Full Code  Family Communication: No family present at bedside Patient is from: Home Disposition Plan: To be determined however most likely SNF Barriers to discharge: PT assessment pending.  Status is: Inpatient  Remains inpatient appropriate because:Inpatient level of care appropriate due to severity of illness   Dispo: The patient is from: Home              Anticipated d/c is to: SNF              Anticipated d/c date is: 1 day              Patient currently is not medically stable to d/c.         Estimated body mass index is 23.23 kg/m as calculated from the following:   Height as of this encounter: 4\' 11"  (1.499 m).   Weight as of this encounter: 52.2 kg.      Nutritional status:               Consultants:   Orthopedics  Procedures:   Left intramedullary femoral nail  Antimicrobials:  Anti-infectives (From admission, onward)   Start     Dose/Rate Route Frequency Ordered Stop   06/02/19 1700  ceFAZolin (ANCEF) IVPB 1 g/50 mL premix     1 g 100 mL/hr over 30 Minutes Intravenous Every 6 hours 06/02/19 1330 06/03/19 0723   06/02/19 0745  ceFAZolin (ANCEF) IVPB 2g/100 mL premix  2 g 200 mL/hr over 30 Minutes Intravenous On call to O.R. 06/02/19 0739 06/02/19 1105         Subjective: Patient seen and examined.  Feels better.  Complains of left foot pain.  She thinks that she caught her that foot in the hall.  She was able to walk with physical therapy today.  Objective: Vitals:   06/02/19 1951 06/03/19 0040 06/03/19 0318 06/03/19 0824  BP: 107/81 96/69 119/81 113/72  Pulse: 88 74 75 99  Resp: 13 14 12 16   Temp: 99.3 F (37.4 C) 98.9 F (37.2 C) 98.3 F (36.8 C) 98.3 F (36.8 C)  TempSrc: Oral Oral Oral Oral  SpO2: 96% 94% 96% 93%  Weight:      Height:        Intake/Output  Summary (Last 24 hours) at 06/03/2019 1120 Last data filed at 06/03/2019 0300 Gross per 24 hour  Intake 904.22 ml  Output 200 ml  Net 704.22 ml   Filed Weights   06/01/19 1826 06/02/19 0833  Weight: 52.2 kg 52.2 kg    Examination:  General exam: Appears calm and comfortable  Respiratory system: Clear to auscultation. Respiratory effort normal. Cardiovascular system: S1 & S2 heard, RRR. No JVD, murmurs, rubs, gallops or clicks. No pedal edema. Gastrointestinal system: Abdomen is nondistended, soft and nontender. No organomegaly or masses felt. Normal bowel sounds heard. Central nervous system: Alert and oriented. No focal neurological deficits. Skin: No rashes, lesions or ulcers.  Psychiatry: Judgement and insight appear normal. Mood & affect appropriate.   Data Reviewed: I have personally reviewed following labs and imaging studies  CBC: Recent Labs  Lab 06/01/19 1845 06/02/19 0335 06/03/19 1041  WBC 5.8 7.9 7.4  NEUTROABS 4.5  --  5.8  HGB 13.0 11.1* 8.0*  HCT 41.6 35.1* 25.3*  MCV 88.3 88.6 90.0  PLT 394 339 390   Basic Metabolic Panel: Recent Labs  Lab 06/01/19 1845 06/02/19 0335  NA 138 138  K 4.5 4.3  CL 98 107  CO2 25 23  GLUCOSE 118* 132*  BUN 33* 24*  CREATININE 1.54* 1.16*  CALCIUM 10.6* 9.3   GFR: Estimated Creatinine Clearance: 34.3 mL/min (A) (by C-G formula based on SCr of 1.16 mg/dL (H)). Liver Function Tests: Recent Labs  Lab 06/01/19 1845  AST 31  ALT 15  ALKPHOS 40  BILITOT 0.6  PROT 7.5  ALBUMIN 4.0   No results for input(s): LIPASE, AMYLASE in the last 168 hours. No results for input(s): AMMONIA in the last 168 hours. Coagulation Profile: No results for input(s): INR, PROTIME in the last 168 hours. Cardiac Enzymes: No results for input(s): CKTOTAL, CKMB, CKMBINDEX, TROPONINI in the last 168 hours. BNP (last 3 results) No results for input(s): PROBNP in the last 8760 hours. HbA1C: No results for input(s): HGBA1C in the last 72  hours. CBG: Recent Labs  Lab 06/01/19 1828  GLUCAP 107*   Lipid Profile: No results for input(s): CHOL, HDL, LDLCALC, TRIG, CHOLHDL, LDLDIRECT in the last 72 hours. Thyroid Function Tests: No results for input(s): TSH, T4TOTAL, FREET4, T3FREE, THYROIDAB in the last 72 hours. Anemia Panel: No results for input(s): VITAMINB12, FOLATE, FERRITIN, TIBC, IRON, RETICCTPCT in the last 72 hours. Sepsis Labs: No results for input(s): PROCALCITON, LATICACIDVEN in the last 168 hours.  Recent Results (from the past 240 hour(s))  SARS Coronavirus 2 by RT PCR (hospital order, performed in [facility] hospital lab) Nasopharyngeal Nasopharyngeal Swab     Status: None   Collection  Time: 06/01/19  6:28 PM   Specimen: Nasopharyngeal Swab  Result Value Ref Range Status   SARS Coronavirus 2 NEGATIVE NEGATIVE Final    Comment: (NOTE) SARS-CoV-2 target nucleic acids are NOT DETECTED. The SARS-CoV-2 RNA is generally detectable in upper and lower respiratory specimens during the acute phase of infection. The lowest concentration of SARS-CoV-2 viral copies this assay can detect is 250 copies / mL. A negative result does not preclude SARS-CoV-2 infection and should not be used as the sole basis for treatment or other patient management decisions.  A negative result may occur with improper specimen collection / handling, submission of specimen other than nasopharyngeal swab, presence of viral mutation(s) within the areas targeted by this assay, and inadequate number of viral copies (<250 copies / mL). A negative result must be combined with clinical observations, patient history, and epidemiological information. Fact Sheet for Patients:   BoilerBrush.com.cy Fact Sheet for Healthcare Providers: https://pope.com/ This test is not yet approved or cleared  by the [place] FDA and has been authorized for detection and/or diagnosis of SARS-CoV-2 by FDA under  an Emergency Use Authorization (EUA).  This EUA will remain in effect (meaning this test can be used) for the duration of the COVID-19 declaration under Section 564(b)(1) of the Act, 21 U.S.C. section 360bbb-3(b)(1), unless the authorization is terminated or revoked sooner. Performed at [facility], [address], [place], [place] [address]   Surgical pcr screen     Status: None   Collection Time: 06/01/19  9:24 PM   Specimen: Nasal Mucosa; Nasal Swab  Result Value Ref Range Status   MRSA, PCR NEGATIVE NEGATIVE Final   Staphylococcus aureus NEGATIVE NEGATIVE Final    Comment: (NOTE) The Xpert SA Assay (FDA approved for NASAL specimens in patients 30 years of age and older), is one component of a comprehensive surveillance program. It is not intended to diagnose infection nor to guide or monitor treatment. Performed at [facility], [address], [place], [place] [address]       Radiology Studies: Chest Portable 1 View  Result Date: 06/01/2019 CLINICAL DATA:  Preoperative examination. EXAM: PORTABLE CHEST 1 VIEW COMPARISON:  None. FINDINGS: The heart size and mediastinal contours are within normal limits. Both lungs are clear. The visualized skeletal structures are unremarkable. IMPRESSION: No active disease. Electronically Signed   By: Gerome Sam III M.D   On: 06/01/2019 21:49   DG C-Arm 1-60 Min-No Report  Result Date: 06/02/2019 Fluoroscopy was utilized by the requesting physician.  No radiographic interpretation.   DG Femur Portable Min 2 Views Left  Result Date: 06/01/2019 CLINICAL DATA:  69 year old female with fall and left lower extremity deformity. EXAM: LEFT FEMUR PORTABLE 2 VIEWS COMPARISON:  None. FINDINGS: There is a displaced and angulated fracture of the proximal left femoral diaphysis. There is medial angulation of the distal fracture fragment and approximately 15 mm overlap. There is no dislocation. The bones are well mineralized. The soft  tissues are grossly unremarkable. IMPRESSION: Displaced and angulated fracture of the proximal left femoral diaphysis. Electronically Signed   By: Elgie Collard M.D.   On: 06/01/2019 19:41    Scheduled Meds: . amLODipine  5 mg Oral Daily  . docusate sodium  100 mg Oral BID  . gabapentin  200 mg Oral QHS  . sertraline  50 mg Oral Daily  . temazepam  15 mg Oral QHS   Continuous Infusions: . sodium chloride    . methocarbamol (ROBAXIN) IV  LOS: 2 days   Time spent: 27 minutes   Hughie Closs, MD [facility]  06/03/2019, 11:20 AM   To contact the attending provider between 7A-7P or the covering provider during after hours 7P-7A, please log into the web site [email or web address].

## 2019-06-03 NOTE — Progress Notes (Signed)
Patient ID: Julia Mora, female   DOB: [birth year], 69 y.o.   MRN: [id number] Patient is postoperative day 1 intramedullary nailing left femur for subtrochanteric femur fracture.  Patient may be weightbearing as tolerated in the left lower extremity discharge planning pending her ability to ambulate independently patient may need discharge to home with home health therapy or discharge to skilled nursing depending on therapy recommendations.

## 2019-06-03 NOTE — Plan of Care (Signed)

## 2019-06-03 NOTE — Plan of Care (Signed)

## 2019-06-03 NOTE — Evaluation (Addendum)
Physical Therapy Evaluation Patient Details Name: Julia Mora MRN: [id number] DOB: 03/20/1950 Today's Date: 06/03/2019   History of Present Illness  Pt is a 69 y/o female who [place], [place] a L femoral fracture. Pt is now s/p IM nailing on 5/30 and is now WBAT.   Clinical Impression  This patient presents with acute pain and decreased functional independence following the above mentioned procedure. At the time of PT eval, pt was able to tolerate OOB to chair but with increased pain which limited US progressing gait training. Anticipate pt will progress well as pain is managed. This patient is appropriate for skilled PT interventions to address functional limitations, improve safety and independence with functional mobility, and return to PLOF.     Follow Up Recommendations Home health PT;Supervision for mobility/OOB    Equipment Recommendations  Rolling walker with 5" wheels(Youth size - has RW but not sure if it is standard or youth)    Recommendations for Other Services       Precautions / Restrictions Precautions Precautions: Fall Restrictions Weight Bearing Restrictions: Yes LLE Weight Bearing: Weight bearing as tolerated      Mobility  Bed Mobility Overal bed mobility: Needs Assistance Bed Mobility: Supine to Sit     Supine to sit: Min assist     General bed mobility comments: Assist for LLE movement towards the EOB, and scooting hips fully out. Bed pad utilized. Pt was able to transition to sitting EOB with heavy use of rails and increased time to get feet on the floor.   Transfers Overall transfer level: Needs assistance Equipment used: Rolling walker (2 wheeled) Transfers: Sit to/from Stand Sit to Stand: Min guard         General transfer comment: Hands on guarding for safety as pt powered up to full standing position. Pt was able to power-up without physical assist. VC's for proper hand placement on seated surface for  safety.   Ambulation/Gait Ambulation/Gait assistance: Min assist Gait Distance (Feet): 5 Feet Assistive device: Rolling walker (2 wheeled) Gait Pattern/deviations: Step-to pattern;Decreased stride length;Trunk flexed;Narrow base of support Gait velocity: Decreased Gait velocity interpretation: <1.8 ft/sec, indicate of risk for recurrent falls General Gait Details: VC's for sequencing and safety with RW use. Pt was able to take a few pivotal steps around to the chair, but appeared very painful. Assist provided for intermittent balance support and RW management.   Stairs            Wheelchair Mobility    Modified Rankin (Stroke Patients Only)       Balance Overall balance assessment: Needs assistance Sitting-balance support: Single extremity supported;Feet supported Sitting balance-Leahy Scale: Poor Sitting balance - Comments: Single extremity support utilized due to pain   Standing balance support: Bilateral upper extremity supported Standing balance-Leahy Scale: Poor Standing balance comment: Heavy reliance on RW                              Pertinent Vitals/Pain Pain Assessment: 0-10 Pain Score: 2  Pain Location: At rest, in the foot Pain Descriptors / Indicators: Aching Pain Intervention(s): Premedicated before session;Limited activity within patient's tolerance;Monitored during session;Repositioned    Home Living Family/patient expects to be discharged to:: Private residence Living Arrangements: Spouse/significant other Available Help at Discharge: Family;Available 24 hours/day Type of Home: House(Townhome) Home Access: Stairs to enter Entrance Stairs-Rails: Right;Left;Can reach both Entrance Stairs-Number of Steps: 2(at garage) Home Layout: Two  level;Able to live on main level with bedroom/bathroom(bonus room upstairs) Home Equipment: Walker - 2 wheels;Cane - single point;Wheelchair - manual      Prior Function Level of Independence: Independent                Hand Dominance   Dominant Hand: Left    Extremity/Trunk Assessment   Upper Extremity Assessment Upper Extremity Assessment: Overall WFL for tasks assessed    Lower Extremity Assessment Lower Extremity Assessment: RLE deficits/detail RLE Deficits / Details: Decreased strength and AROM consistent with above mentioned injury and subsequent surgery.  RLE: Unable to fully assess due to pain    Cervical / Trunk Assessment Cervical / Trunk Assessment: Normal  Communication   Communication: No difficulties  Cognition Arousal/Alertness: Awake/alert Behavior During Therapy: WFL for tasks assessed/performed Overall Cognitive Status: Within Functional Limits for tasks assessed                                        General Comments      Exercises  x15 LAQ, x10 seated hip abduction, x10 quad sets   Assessment/Plan    PT Assessment Patient needs continued PT services  PT Problem List Decreased strength;Decreased range of motion;Decreased activity tolerance;Decreased balance;Decreased mobility;Decreased knowledge of use of DME;Decreased safety awareness;Decreased knowledge of precautions;Pain       PT Treatment Interventions DME instruction;Gait training;Stair training;Functional mobility training;Therapeutic activities;Therapeutic exercise;Neuromuscular re-education;Patient/family education    PT Goals (Current goals can be found in the Care Plan section)  Acute Rehab PT Goals Patient Stated Goal: return home with husband PT Goal Formulation: With patient Time For Goal Achievement: 06/10/19 Potential to Achieve Goals: Good    Frequency Min 5X/week   Barriers to discharge        Co-evaluation               AM-PAC PT "6 Clicks" Mobility  Outcome Measure Help needed turning from your back to your side while in a flat bed without using bedrails?: A Little Help needed moving from lying on your back to sitting on the side of a flat  bed without using bedrails?: A Little Help needed moving to and from a bed to a chair (including a wheelchair)?: A Little Help needed standing up from a chair using your arms (e.g., wheelchair or bedside chair)?: A Little Help needed to walk in hospital room?: A Little Help needed climbing 3-5 steps with a railing? : A Lot 6 Click Score: 17    End of Session Equipment Utilized During Treatment: Gait belt Activity Tolerance: Patient limited by pain Patient left: in chair;with call bell/phone within reach Nurse Communication: Mobility status PT Visit Diagnosis: Pain;Other abnormalities of gait and mobility (R26.89) Pain - Right/Left: Left Pain - part of body: Hip    Time: 7829-5621 PT Time Calculation (min) (ACUTE ONLY): 33 min   Charges:   PT Evaluation $PT Eval Moderate Complexity: 1 Mod PT Treatments $Gait Training: 8-22 mins        Rolinda Roan, PT, DPT Acute Rehabilitation Services Pager: [phone] Office: [phone]   Thelma Comp 06/03/2019, 11:21 AM

## 2019-06-04 ENCOUNTER — Encounter: Payer: Self-pay | Admitting: *Deleted

## 2019-06-04 LAB — BASIC METABOLIC PANEL
Anion gap: 6 (ref 5–15)
BUN: 23 mg/dL (ref 8–23)
CO2: 27 mmol/L (ref 22–32)
Calcium: 8.5 mg/dL — ABNORMAL LOW (ref 8.9–10.3)
Chloride: 104 mmol/L (ref 98–111)
Creatinine, Ser: 1.07 mg/dL — ABNORMAL HIGH (ref 0.44–1.00)
GFR calc Af Amer: >60 mL/min (ref >60)
GFR calc non Af Amer: 53 mL/min — ABNORMAL LOW (ref >60)
Glucose, Bld: 99 mg/dL (ref 70–99)
Potassium: 3.9 mmol/L (ref 3.5–5.1)
Sodium: 137 mmol/L (ref 135–145)

## 2019-06-04 LAB — CBC WITH DIFFERENTIAL/PLATELET
Abs Immature Granulocytes: 0.04 10*3/uL (ref 0.00–0.07)
Basophils Absolute: 0 10*3/uL (ref 0.0–0.1)
Basophils Relative: 0 %
Eosinophils Absolute: 0.2 10*3/uL (ref 0.0–0.5)
Eosinophils Relative: 3 %
HCT: 22.3 % — ABNORMAL LOW (ref 36.0–46.0)
Hemoglobin: 7.1 g/dL — ABNORMAL LOW (ref 12.0–15.0)
Immature Granulocytes: 1 %
Lymphocytes Relative: 13 %
Lymphs Abs: 0.8 10*3/uL (ref 0.7–4.0)
MCH: 28.3 pg (ref 26.0–34.0)
MCHC: 31.8 g/dL (ref 30.0–36.0)
MCV: 88.8 fL (ref 80.0–100.0)
Monocytes Absolute: 0.5 10*3/uL (ref 0.1–1.0)
Monocytes Relative: 9 %
Neutro Abs: 4.3 10*3/uL (ref 1.7–7.7)
Neutrophils Relative %: 74 %
Platelets: 214 10*3/uL (ref 150–400)
RBC: 2.51 MIL/uL — ABNORMAL LOW (ref 3.87–5.11)
RDW: 15.4 % (ref 11.5–15.5)
WBC: 5.8 10*3/uL (ref 4.0–10.5)
nRBC: 0 % (ref 0.0–0.2)

## 2019-06-04 LAB — HEMOGLOBIN AND HEMATOCRIT, BLOOD
HCT: 26.6 % — ABNORMAL LOW (ref 36.0–46.0)
Hemoglobin: 8.7 g/dL — ABNORMAL LOW (ref 12.0–15.0)

## 2019-06-04 LAB — PREPARE RBC (CROSSMATCH)

## 2019-06-04 MED ORDER — SODIUM CHLORIDE 0.9% IV SOLUTION: Freq: Once | INTRAVENOUS | Status: AC

## 2019-06-04 NOTE — Progress Notes (Signed)
PROGRESS NOTE    Julia Mora  YDX:[id number] DOB: 11-27-1950 DOA: 06/01/2019 PCP: Tamsen Roers, MD   Brief Narrative:   Julia Mora is a 69 y.o. female with medical history significant for hypertension, CKD stage III, anxiety, osteoporosis, cervical radiculopathy, lumbar degenerative disc disease who presented to the ED for evaluation of left hip pain. Patient states [place] the park when she misstepped into about a 1 foot deep hole. She got her leg twisted around and that she fell onto the sidewalk on her left hip. She had immediate pain and was unable to bear weight on her own. She did not lose consciousness.  Upon arrival to ED, she was hemodynamically stable.  Left femur x-ray showed displaced and angulated fracture of the proximal femoral diaphysis.  She was admitted to hospital service.  Orthopedics consulted.  She underwent intramedullary nailing left femur on 06/02/2019.  Assessment & Plan:   Principal Problem:   Closed left femoral fracture (HCC) Active Problems:   Essential hypertension   CKD (chronic kidney disease) stage 3, GFR 30-59 ml/min   Fall  Displaced left femur fracture: Occurring after mechanical fall. Orthopedics following, patient placed in Buck's traction.  Now status post surgical repair.  Management per orthopedics.  AKI CKD stage IIIa: Baseline creatinine 1-1.2. Creatinine on admission slightly increased from baseline at 1.54. Placed on gentle IV fluid hydration overnight.  Creatinine improved.  Now back to baseline.  Morning labs pending for today.  Essential hypertension: Low normal. Continue amlodipine but continue to hold losartan.  Adjustment disorder/anxiety: Continue sertraline.  Left foot pain/sprain: On examination it is nontender.  She walked without any pain.  Doubt any fracture.  Acute blood loss/postoperative anemia: Admission hemoglobin was 13.0.  Dropped to 7.1 postoperatively.  Transfuse 1 unit of PRBC today and recheck  hemoglobin afterwards and tomorrow morning.  We will keep in the hospital overnight observation as she is does not feel well enough to go home and needs further monitoring for postoperative anemia as well.  No evidence of active bleeding and surgical site looks intact.   DVT prophylaxis: Per my discussion with Dr. Sharol Given, he recommends aspirin 81 mg daily for DVT prophylaxis which she is already on.   Code Status: Full Code  Family Communication: No family present at bedside.  Discussed with patient herself Patient is from: Home Disposition Plan: Home with home health care tomorrow Barriers to discharge: Acute blood loss anemia requiring transfusion and patient not feeling well  Status is: Inpatient  Remains inpatient appropriate because:Inpatient level of care appropriate due to severity of illness   Dispo: The patient is from: Home              Anticipated d/c is to: Home with home health              Anticipated d/c date is: 1 day              Patient currently is not medically stable to d/c.         Estimated body mass index is 23.23 kg/m as calculated from the following:   Height as of this encounter: 4\' 11"  (1.499 m).   Weight as of this encounter: 52.2 kg.      Nutritional status:               Consultants:   Orthopedics  Procedures:   Left intramedullary femoral nail  Antimicrobials:  Anti-infectives (From admission, onward)   Start  Dose/Rate Route Frequency Ordered Stop   06/02/19 1700  ceFAZolin (ANCEF) IVPB 1 g/50 mL premix     1 g 100 mL/hr over 30 Minutes Intravenous Every 6 hours 06/02/19 1330 06/03/19 0723   06/02/19 0745  ceFAZolin (ANCEF) IVPB 2g/100 mL premix     2 g 200 mL/hr over 30 Minutes Intravenous On call to O.R. 06/02/19 0739 06/02/19 1105         Subjective: Patient seen and examined.  She overall feels well and improving but somehow she feels little tired today.  Does not feel like she is ready to go  home.  Objective: Vitals:   06/04/19 0351 06/04/19 0810 06/04/19 1115 06/04/19 1152  BP: 130/85 (!) 124/94 114/81 119/81  Pulse: 84 89 92 88  Resp: 15 16 16 14   Temp: 98.8 F (37.1 C) 98.4 F (36.9 C) 98.9 F (37.2 C) 99.1 F (37.3 C)  TempSrc: Oral Oral Oral Oral  SpO2: 94% 94% 94% 94%  Weight:      Height:       No intake or output data in the 24 hours ending 06/04/19 1356 Filed Weights   06/01/19 1826 06/02/19 0833  Weight: 52.2 kg 52.2 kg    Examination:  General exam: Appears calm and comfortable  Respiratory system: Clear to auscultation. Respiratory effort normal. Cardiovascular system: S1 & S2 heard, RRR. No JVD, murmurs, rubs, gallops or clicks. No pedal edema. Gastrointestinal system: Abdomen is nondistended, soft and nontender. No organomegaly or masses felt. Normal bowel sounds heard. Central nervous system: Alert and oriented. No focal neurological deficits. Extremities: Symmetric 5 x 5 power. Skin: No rashes, lesions or ulcers.  Psychiatry: Judgement and insight appear normal. Mood & affect appropriate.   Data Reviewed: I have personally reviewed following labs and imaging studies  CBC: Recent Labs  Lab 06/01/19 1845 06/02/19 0335 06/03/19 1041 06/04/19 0335  WBC 5.8 7.9 7.4 5.8  NEUTROABS 4.5  --  5.8 4.3  HGB 13.0 11.1* 8.0* 7.1*  HCT 41.6 35.1* 25.3* 22.3*  MCV 88.3 88.6 90.0 88.8  PLT 394 339 235 214   Basic Metabolic Panel: Recent Labs  Lab 06/01/19 1845 06/02/19 0335 06/03/19 1041 06/04/19 0335  NA 138 138 134* 137  K 4.5 4.3 3.7 3.9  CL 98 107 102 104  CO2 25 23 21* 27  GLUCOSE 118* 132* 123* 99  BUN 33* 24* 22 23  CREATININE 1.54* 1.16* 1.27* 1.07*  CALCIUM 10.6* 9.3 8.2* 8.5*   GFR: Estimated Creatinine Clearance: 37.2 mL/min (A) (by C-G formula based on SCr of 1.07 mg/dL (H)). Liver Function Tests: Recent Labs  Lab 06/01/19 1845  AST 31  ALT 15  ALKPHOS 40  BILITOT 0.6  PROT 7.5  ALBUMIN 4.0   No results for  input(s): LIPASE, AMYLASE in the last 168 hours. No results for input(s): AMMONIA in the last 168 hours. Coagulation Profile: No results for input(s): INR, PROTIME in the last 168 hours. Cardiac Enzymes: No results for input(s): CKTOTAL, CKMB, CKMBINDEX, TROPONINI in the last 168 hours. BNP (last 3 results) No results for input(s): PROBNP in the last 8760 hours. HbA1C: No results for input(s): HGBA1C in the last 72 hours. CBG: Recent Labs  Lab 06/01/19 1828  GLUCAP 107*   Lipid Profile: No results for input(s): CHOL, HDL, LDLCALC, TRIG, CHOLHDL, LDLDIRECT in the last 72 hours. Thyroid Function Tests: No results for input(s): TSH, T4TOTAL, FREET4, T3FREE, THYROIDAB in the last 72 hours. Anemia Panel: No results for input(s):  VITAMINB12, FOLATE, FERRITIN, TIBC, IRON, RETICCTPCT in the last 72 hours. Sepsis Labs: No results for input(s): PROCALCITON, LATICACIDVEN in the last 168 hours.  Recent Results (from the past 240 hour(s))  SARS Coronavirus 2 by RT PCR (hospital order, performed in [facility] hospital lab) Nasopharyngeal Nasopharyngeal Swab     Status: None   Collection Time: 06/01/19  6:28 PM   Specimen: Nasopharyngeal Swab  Result Value Ref Range Status   SARS Coronavirus 2 NEGATIVE NEGATIVE Final    Comment: (NOTE) SARS-CoV-2 target nucleic acids are NOT DETECTED. The SARS-CoV-2 RNA is generally detectable in upper and lower respiratory specimens during the acute phase of infection. The lowest concentration of SARS-CoV-2 viral copies this assay can detect is 250 copies / mL. A negative result does not preclude SARS-CoV-2 infection and should not be used as the sole basis for treatment or other patient management decisions.  A negative result may occur with improper specimen collection / handling, submission of specimen other than nasopharyngeal swab, presence of viral mutation(s) within the areas targeted by this assay, and inadequate number of viral copies (<250  copies / mL). A negative result must be combined with clinical observations, patient history, and epidemiological information. Fact Sheet for Patients:   BoilerBrush.com.cy Fact Sheet for Healthcare Providers: https://pope.com/ This test is not yet approved or cleared  by the [place] FDA and has been authorized for detection and/or diagnosis of SARS-CoV-2 by FDA under an Emergency Use Authorization (EUA).  This EUA will remain in effect (meaning this test can be used) for the duration of the COVID-19 declaration under Section 564(b)(1) of the Act, 21 U.S.C. section 360bbb-3(b)(1), unless the authorization is terminated or revoked sooner. Performed at [facility], [address], [place], [place] [address]   Surgical pcr screen     Status: None   Collection Time: 06/01/19  9:24 PM   Specimen: Nasal Mucosa; Nasal Swab  Result Value Ref Range Status   MRSA, PCR NEGATIVE NEGATIVE Final   Staphylococcus aureus NEGATIVE NEGATIVE Final    Comment: (NOTE) The Xpert SA Assay (FDA approved for NASAL specimens in patients 34 years of age and older), is one component of a comprehensive surveillance program. It is not intended to diagnose infection nor to guide or monitor treatment. Performed at [facility], [address] [place], [place] [address]       Radiology Studies: No results found.  Scheduled Meds:  sodium chloride   Intravenous Once   amLODipine  5 mg Oral Daily   aspirin EC  81 mg Oral Daily   cholecalciferol  2,000 Units Oral Daily   docusate sodium  100 mg Oral BID   fenofibrate  160 mg Oral Daily   fluticasone  1 spray Each Nare Daily   gabapentin  200 mg Oral QHS   magnesium oxide  200 mg Oral QHS   multivitamin  1 tablet Oral Daily   sertraline  50 mg Oral Daily   temazepam  15 mg Oral QHS   Continuous Infusions:  sodium chloride 10 mL/hr at 06/04/19 1143   methocarbamol (ROBAXIN)  IV       LOS: 3 days   Time spent: 28 minutes   Hughie Closs, MD [facility]  06/04/2019, 1:56 PM   To contact the attending provider between 7A-7P or the covering provider during after hours 7P-7A, please log into the web site [email or web address].

## 2019-06-04 NOTE — Plan of Care (Signed)

## 2019-06-04 NOTE — Plan of Care (Signed)

## 2019-06-04 NOTE — Progress Notes (Signed)
Physical Therapy Treatment Patient Details Name: Julia Mora MRN: [id number] DOB: 1950/09/17 Today's Date: 06/04/2019    History of Present Illness Pt is a 69 y/o female who [place] the park, sustaining a L femoral fracture. Pt is now s/p IM nailing on 5/30 and is now WBAT.     PT Comments    Pt progressing towards physical therapy goals. We were able to improve ambulation distance this session, with occasional min assist for balance support and walker management. Pt reports she may go home today but is concerned she is not ready. Will continue to follow and progress as able per POC.    Follow Up Recommendations  Home health PT;Supervision for mobility/OOB     Equipment Recommendations  Rolling walker with 5" wheels(Youth size - has RW but not sure if it is standard or youth)    Recommendations for Other Services       Precautions / Restrictions Precautions Precautions: Fall Restrictions Weight Bearing Restrictions: No LLE Weight Bearing: Weight bearing as tolerated    Mobility  Bed Mobility               General bed mobility comments: Pt was received on BSC with NT present  Transfers Overall transfer level: Needs assistance Equipment used: Rolling walker (2 wheeled) Transfers: Sit to/from Stand Sit to Stand: Min guard         General transfer comment: Hands on guarding for safety as pt powered up to full standing position. Pt was able to power-up without physical assist. VC's for proper hand placement on seated surface for safety.   Ambulation/Gait Ambulation/Gait assistance: Min guard;Min assist Gait Distance (Feet): 75 Feet Assistive device: Rolling walker (2 wheeled) Gait Pattern/deviations: Step-to pattern;Decreased stride length;Trunk flexed;Narrow base of support;Step-through pattern Gait velocity: Decreased Gait velocity interpretation: <1.8 ft/sec, indicate of risk for recurrent falls General Gait Details: Occasional  assist for walker management. Hands on guarding throughout with gait belt for balance and safety. Pt was able to progress to intermittent step-through pattern.   Stairs             Wheelchair Mobility    Modified Rankin (Stroke Patients Only)       Balance Overall balance assessment: Needs assistance Sitting-balance support: Single extremity supported;Feet supported Sitting balance-Leahy Scale: Poor Sitting balance - Comments: Single extremity support utilized due to pain   Standing balance support: Bilateral upper extremity supported Standing balance-Leahy Scale: Poor Standing balance comment: Required UE support on counter to wash hands.                            Cognition Arousal/Alertness: Awake/alert Behavior During Therapy: WFL for tasks assessed/performed Overall Cognitive Status: Within Functional Limits for tasks assessed                                        Exercises      General Comments        Pertinent Vitals/Pain Pain Assessment: Faces Faces Pain Scale: Hurts little more Pain Descriptors / Indicators: Aching Pain Intervention(s): Limited activity within patient's tolerance;Monitored during session;Repositioned    Home Living                      Prior Function            PT Goals (current goals  can now be found in the care plan section) Acute Rehab PT Goals Patient Stated Goal: return home with husband PT Goal Formulation: With patient Time For Goal Achievement: 06/10/19 Potential to Achieve Goals: Good Progress towards PT goals: Progressing toward goals    Frequency    Min 5X/week      PT Plan Current plan remains appropriate    Co-evaluation              AM-PAC PT "6 Clicks" Mobility   Outcome Measure  Help needed turning from your back to your side while in a flat bed without using bedrails?: A Little Help needed moving from lying on your back to sitting on the side of a flat  bed without using bedrails?: A Little Help needed moving to and from a bed to a chair (including a wheelchair)?: A Little Help needed standing up from a chair using your arms (e.g., wheelchair or bedside chair)?: A Little Help needed to walk in hospital room?: A Little Help needed climbing 3-5 steps with a railing? : A Lot 6 Click Score: 17    End of Session Equipment Utilized During Treatment: Gait belt Activity Tolerance: Patient limited by pain Patient left: in chair;with call bell/phone within reach Nurse Communication: Mobility status PT Visit Diagnosis: Pain;Other abnormalities of gait and mobility (R26.89) Pain - Right/Left: Left Pain - part of body: Hip     Time: 7829-5621 PT Time Calculation (min) (ACUTE ONLY): 26 min  Charges:  $Gait Training: 23-37 mins                     Conni Slipper, PT, DPT Acute Rehabilitation Services Pager: [phone] Office: [phone]    Marylynn Pearson 06/04/2019, 10:14 AM

## 2019-06-04 NOTE — TOC Initial Note (Signed)
Transition of Care [facility]) - Initial/Assessment Note    Patient Details  Name: Julia Mora MRN: [id number] Date of Birth: 1950-09-22  Transition of Care [facility] CM/SW Contact:    Epifanio Lesches, RN Phone Number: [phone] 06/04/2019, 6:51 PM  Clinical Narrative:      Presents with closed L  femoral fracture. Hx of htn, CKD, anxiety, osteoporosis, cervical radiculopathy, lumbar DDD. From home with husband. DME: 2 walkers, 2 canes, and a W/C.        - s/p  IM nailing of L femoral fx, 5/30  PT evaluation pending...Marland KitchenMarland KitchenMarland Kitchen pt states preference, d/c home with home health services. TOC team following and monitoring for needs.  Expected Discharge Plan: Home w Home Health Services(vs SNF) Barriers to Discharge: Continued Medical Work up   Patient Goals and CMS Choice        Expected Discharge Plan and Services Expected Discharge Plan: Home w Home Health Services(vs SNF)   Discharge Planning Services: CM Consult                                          Prior Living Arrangements/Services   Lives with:: Spouse Patient language and need for interpreter reviewed:: Yes Do you feel safe going back to the place where you live?: Yes      Need for Family Participation in Patient Care: Yes (Comment) Care giver support system in place?: Yes (comment) Current home services: (Owns 2 walkers, 2 cane, W/C) Criminal Activity/Legal Involvement Pertinent to Current Situation/Hospitalization: No - Comment as needed  Activities of Daily Living Home Assistive Devices/Equipment: None ADL Screening (condition at time of admission) Patient's cognitive ability adequate to safely complete daily activities?: Yes Is the patient deaf or have difficulty hearing?: No(Has ringing in the ears) Does the patient have difficulty seeing, even when wearing glasses/contacts?: Yes Does the patient have difficulty concentrating, remembering, or making decisions?: No Patient able to express need for  assistance with ADLs?: Yes Does the patient have difficulty dressing or bathing?: No Independently performs ADLs?: Yes (appropriate for developmental age) Does the patient have difficulty walking or climbing stairs?: No Weakness of Legs: Left Weakness of Arms/Hands: None  Permission Sought/Granted   Permission granted to share information with : Yes, Verbal Permission Granted              Emotional Assessment Appearance:: Appears stated age Attitude/Demeanor/Rapport: Gracious Affect (typically observed): Accepting Orientation: : Oriented to Self, Oriented to Place, Oriented to  Time, Oriented to Situation Alcohol / Substance Use: Not Applicable Psych Involvement: No (comment)  Admission diagnosis:  Preop examination [Z01.818] Closed left femoral fracture (HCC) [S72.92XA] Closed displaced oblique fracture of shaft of left femur, initial encounter (HCC) [S72.332A] Fall, initial encounter [W19.XXXA] Patient Active Problem List   Diagnosis Date Noted  . Fall   . Closed left femoral fracture (HCC) 06/01/2019  . Essential hypertension 06/01/2019  . CKD (chronic kidney disease) stage 3, GFR 30-59 ml/min 06/01/2019  . Left knee injury 08/21/2012  . Right foot pain 07/24/2012   PCP:  Aida Puffer, MD Pharmacy:   [id number] - [place], [place] - [address] [place] [address] Phone: [phone] Fax: [phone]  [facility] - [address], [place] - [address] [place] [address] Phone: [phone] Fax:  [phone]     Social Determinants of Health (SDOH) Interventions    Readmission Risk Interventions No flowsheet data found.

## 2019-06-04 NOTE — Progress Notes (Signed)
Patients c/o 'popping sound" to lt hip when walking. Notified MD and new order xray noted.

## 2019-06-04 NOTE — Progress Notes (Signed)
PT Cancellation Note  Patient Details Name: Julia Mora MRN: [id number] DOB: 03/16/1950   Cancelled Treatment:     PT attempted second treatment session as discussed with pt during morning session. Pt received in bed with blood transfusion administering. She reports feeling fatigued and sore. Since the plan is not for d/c this afternoon, the pt opted to skip afternoon session and resume tomorrow morning. Operative LE repositioned on pillow in bed for comfort and pt was left with bed alarm set, all needs met, and visitor present. Will continue to follow.    Laura D Kirkman 06/04/2019, 1:30 PM   Laura Kirkman, PT, DPT Acute Rehabilitation Services Pager: [phone] Office: [phone]    

## 2019-06-04 NOTE — Progress Notes (Signed)
!  st unit of PRBCs started- the patient was educated in regard to S/S to report if she has any problems during the administration of blood product.  The patient verbalizes understanding.

## 2019-06-05 DIAGNOSIS — I10 Essential (primary) hypertension: Secondary | ICD-10-CM

## 2019-06-05 LAB — TYPE AND SCREEN
ABO/RH(D): O POS
Antibody Screen: NEGATIVE
Unit division: 0

## 2019-06-05 LAB — BPAM RBC
Blood Product Expiration Date: 202106282359
ISSUE DATE / TIME: 202106011125
Unit Type and Rh: 5100

## 2019-06-05 MED ORDER — LOSARTAN POTASSIUM 50 MG PO TABS
25.0000 mg | ORAL_TABLET | Freq: Every day | ORAL | Status: DC
  Administered 2019-06-05 – 2019-06-06 (×2): 25 mg via ORAL
  Filled 2019-06-05 (×2): qty 1

## 2019-06-05 NOTE — Progress Notes (Signed)
Progress Note    Julia Mora  WYO:[id number] DOB: Jun 19, 1950  DOA: 06/01/2019 PCP: Tamsen Roers, MD    Brief Narrative:     Medical records reviewed and are as summarized below:  Julia Mora is an 69 y.o. female with a past medical history that includes hypertension, chronic kidney disease stage III, osteoporosis, anxiety, cervical radiculopathy, degenerative disc disease who was admitted May 29 for left hip pain after a misstep into a hole.  Work-up revealed left femur displaced and angulated fracture of the proximal femur are all diaphysis.  Underwent intramedullary nailing of the left femur on May 30.  Evaluated by PT who recommended SNF today.  Process for placement in place  Assessment/Plan:   Principal Problem:   Closed left femoral fracture (HCC) Active Problems:   Essential hypertension   CKD (chronic kidney disease) stage 3, GFR 30-59 ml/min   Fall   #1.  Displaced left femur fracture.  As a result of a mechanical fall.  Underwent surgery May 30. -Management per orthopedics -Pain control -Placement  . 2 Acute kidney injury superimposed on chronic kidney disease stage III.  Baseline creatinine is 1-1.2.  Slightly increased on admission.  She was provided with IV fluids and creatinine improved.  Taking fluids and food well.  #3.  Hypertension.  Home medications include amlodipine and losartan.  Losartan was on hold due to #2.  The pressure creeping up. -Resume losartan -Continue amlodipine  #4.  Depression/anxiety.  Stable at baseline -Continue home meds  #5.  Acute blood loss anemia secondary to surgery.  On admission her hemoglobin was 13.  It dropped to 7.1 postoperatively.  She was provided with 1 unit of packed red blood cells on June 1.  CBC ordered for this morning but that has not been done yet.  Of note hemoglobin last night after transfusion was 8.7. -CBC as ordered for today    Family Communication/Anticipated D/C date and plan/Code Status     DVT prophylaxis: asa 81mg  per ortho. Code Status: Full Code.  Family Communication: patient Disposition Plan: Status is: Inpatient  Remains inpatient appropriate because:Unsafe d/c plan   Dispo: The patient is from: Home              Anticipated d/c is to: SNF              Anticipated d/c date is: 1 day              Patient currently is medically stable to d/c.          Medical Consultants:   [facility]   Anti-Infectives:    None  Subjective:   Sitting up in chair eating breakfast.  Reports pain well managed.  Concerned that husband will not be able to provide care.  Objective:    Vitals:   06/04/19 1630 06/04/19 1916 06/05/19 0512 06/05/19 0804  BP: 109/78 129/80 (!) 160/99 (!) 154/97  Pulse:  78 86 91  Resp: 14 16 18 16   Temp: 98.9 F (37.2 C) 98.6 F (37 C) 98.5 F (36.9 C) 99.2 F (37.3 C)  TempSrc: Oral Oral Oral Oral  SpO2: 94% 95% 95% 97%  Weight:      Height:        Intake/Output Summary (Last 24 hours) at 06/05/2019 1419 Last data filed at 06/05/2019 1100 Gross per 24 hour  Intake 120 ml  Output --  Net 120 ml   Filed Weights   06/01/19 1826 06/02/19 7741  Weight: 52.2 kg 52.2 kg    Exam: General: Awake alert no acute distress CV: Regular rate and rhythm no murmur gallop or rub no lower extremity edema Respiratory: No increased work of breathing with conversation or eating breath sounds are clear bilaterally I hear no wheeze no rhonchi Abdomen: Nondistended soft positive bowel sounds throughout nontender to palpation no mass organomegaly noted Musculoskeletal: Joints without swelling/erythema moves all extremities spontaneously Neuro: Alert and oriented x3 speech clear facial symmetry  Data Reviewed:   I have personally reviewed following labs and imaging studies:  Labs: Labs show the following:   Basic Metabolic Panel: Recent Labs  Lab 06/01/19 1845 06/01/19 1845 06/02/19 0335 06/02/19 0335 06/03/19 1041 06/04/19 0335   NA 138  --  138  --  134* 137  K 4.5   < > 4.3   < > 3.7 3.9  CL 98  --  107  --  102 104  CO2 25  --  23  --  21* 27  GLUCOSE 118*  --  132*  --  123* 99  BUN 33*  --  24*  --  22 23  CREATININE 1.54*  --  1.16*  --  1.27* 1.07*  CALCIUM 10.6*  --  9.3  --  8.2* 8.5*   < > = values in this interval not displayed.   GFR Estimated Creatinine Clearance: 37.2 mL/min (A) (by C-G formula based on SCr of 1.07 mg/dL (H)). Liver Function Tests: Recent Labs  Lab 06/01/19 1845  AST 31  ALT 15  ALKPHOS 40  BILITOT 0.6  PROT 7.5  ALBUMIN 4.0   No results for input(s): LIPASE, AMYLASE in the last 168 hours. No results for input(s): AMMONIA in the last 168 hours. Coagulation profile No results for input(s): INR, PROTIME in the last 168 hours.  CBC: Recent Labs  Lab 06/01/19 1845 06/02/19 0335 06/03/19 1041 06/04/19 0335 06/04/19 1902  WBC 5.8 7.9 7.4 5.8  --   NEUTROABS 4.5  --  5.8 4.3  --   HGB 13.0 11.1* 8.0* 7.1* 8.7*  HCT 41.6 35.1* 25.3* 22.3* 26.6*  MCV 88.3 88.6 90.0 88.8  --   PLT 394 339 235 214  --    Cardiac Enzymes: No results for input(s): CKTOTAL, CKMB, CKMBINDEX, TROPONINI in the last 168 hours. BNP (last 3 results) No results for input(s): PROBNP in the last 8760 hours. CBG: Recent Labs  Lab 06/01/19 1828  GLUCAP 107*   D-Dimer: No results for input(s): DDIMER in the last 72 hours. Hgb A1c: No results for input(s): HGBA1C in the last 72 hours. Lipid Profile: No results for input(s): CHOL, HDL, LDLCALC, TRIG, CHOLHDL, LDLDIRECT in the last 72 hours. Thyroid function studies: No results for input(s): TSH, T4TOTAL, T3FREE, THYROIDAB in the last 72 hours.  Invalid input(s): FREET3 Anemia work up: No results for input(s): VITAMINB12, FOLATE, FERRITIN, TIBC, IRON, RETICCTPCT in the last 72 hours. Sepsis Labs: Recent Labs  Lab 06/01/19 1845 06/02/19 0335 06/03/19 1041 06/04/19 0335  WBC 5.8 7.9 7.4 5.8    Microbiology Recent Results (from the  past 240 hour(s))  SARS Coronavirus 2 by RT PCR (hospital order, performed in [facility] hospital lab) Nasopharyngeal Nasopharyngeal Swab     Status: None   Collection Time: 06/01/19  6:28 PM   Specimen: Nasopharyngeal Swab  Result Value Ref Range Status   SARS Coronavirus 2 NEGATIVE NEGATIVE Final    Comment: (NOTE) SARS-CoV-2 target nucleic acids are NOT DETECTED. The SARS-CoV-2  RNA is generally detectable in upper and lower respiratory specimens during the acute phase of infection. The lowest concentration of SARS-CoV-2 viral copies this assay can detect is 250 copies / mL. A negative result does not preclude SARS-CoV-2 infection and should not be used as the sole basis for treatment or other patient management decisions.  A negative result may occur with improper specimen collection / handling, submission of specimen other than nasopharyngeal swab, presence of viral mutation(s) within the areas targeted by this assay, and inadequate number of viral copies (<250 copies / mL). A negative result must be combined with clinical observations, patient history, and epidemiological information. Fact Sheet for Patients:   BoilerBrush.com.cy Fact Sheet for Healthcare Providers: https://pope.com/ This test is not yet approved or cleared  by the [place] FDA and has been authorized for detection and/or diagnosis of SARS-CoV-2 by FDA under an Emergency Use Authorization (EUA).  This EUA will remain in effect (meaning this test can be used) for the duration of the COVID-19 declaration under Section 564(b)(1) of the Act, 21 U.S.C. section 360bbb-3(b)(1), unless the authorization is terminated or revoked sooner. Performed at [facility], [address] [place], [place] [address]   Surgical pcr screen     Status: None   Collection Time: 06/01/19  9:24 PM   Specimen: Nasal Mucosa; Nasal Swab  Result Value Ref Range Status   MRSA, PCR  NEGATIVE NEGATIVE Final   Staphylococcus aureus NEGATIVE NEGATIVE Final    Comment: (NOTE) The Xpert SA Assay (FDA approved for NASAL specimens in patients 47 years of age and older), is one component of a comprehensive surveillance program. It is not intended to diagnose infection nor to guide or monitor treatment. Performed at [facility], [address] [place], [place] [address]     Procedures and diagnostic studies:  No results found.  Medications:   . amLODipine  5 mg Oral Daily  . aspirin EC  81 mg Oral Daily  . cholecalciferol  2,000 Units Oral Daily  . docusate sodium  100 mg Oral BID  . fenofibrate  160 mg Oral Daily  . fluticasone  1 spray Each Nare Daily  . gabapentin  200 mg Oral QHS  . magnesium oxide  200 mg Oral QHS  . multivitamin  1 tablet Oral Daily  . sertraline  50 mg Oral Daily  . temazepam  15 mg Oral QHS   Continuous Infusions: . sodium chloride 10 mL/hr at 06/04/19 1143  . methocarbamol (ROBAXIN) IV       LOS: 4 days   Gwenyth Bender NP  [facility]   How to contact the [facility] Attending or Consulting provider 7A - 7P or covering provider during after hours 7P -7A, for this patient?  1. Check the care team in [facility] and look for a) attending/consulting TRH provider listed and b) the [facility] team listed 2. Log into [email or web address] and use [place]'s universal password to access. If you do not have the password, please contact the hospital operator. 3. Locate the [facility] provider you are looking for under [facility] and page to a number that you can be directly reached. 4. If you still have difficulty reaching the provider, please page the [facility] (Director on Call) for the Hospitalists listed on amion for assistance.  06/05/2019, 2:19 PM

## 2019-06-05 NOTE — TOC Progression Note (Addendum)
Transition of Care [facility]) - Progression Note    Patient Details  Name: [name] MRN: [id number] Date of Birth: [birth year]  Transition of Care [facility]) CM/SW Contact  Epifanio Lesches, RN Phone Number: 06/05/2019, 12:59 PM  Clinical Narrative:     NCM received consult for possible SNF placement at time of discharge. NCM spoke with patient regarding PT recommendation of SNF placement at time of discharge. Patient reported that patient's spouse is currently unable to care for patient at their home given patient's current physical needs and fall risk. Patient expressed understanding of PT recommendation and is agreeable to SNF placement at time of discharge. Patient reports preference for [facility]. NCM discussed insurance authorization process and provided Medicare SNF ratings list. Patient expressed being hopeful for rehab and to feel better soon. No further questions reported at this time. NCM to continue to follow and assist with discharge planning needs.  Awaiting bed offers, insurance authorization in process...  Expected Discharge Plan: Skilled Nursing Facility Barriers to Discharge: Continued Medical Work up  Expected Discharge Plan and Services Expected Discharge Plan: Skilled Nursing Facility   Discharge Planning Services: CM Consult                                           Social Determinants of Health (SDOH) Interventions    Readmission Risk Interventions No flowsheet data found.

## 2019-06-05 NOTE — NC FL2 (Addendum)
[place] MEDICAID FL2 LEVEL OF CARE SCREENING TOOL     IDENTIFICATION  Patient Name: Julia Mora Birthdate: [birth year] Sex: female Admission Date (Current Location): 06/01/2019  [facility] and [place] Number:  Producer, television/film/video and Address:  [facility] [place], [address] [place], [place] [address]      Provider Number: [id number]  Attending Physician Name and Address:  Joseph Art, DO  Relative Name and Phone Number:       Current Level of Care: Hospital Recommended Level of Care: Skilled Nursing Facility Prior Approval Number:    Date Approved/Denied:   PASRR Number:  [id number]  Discharge Plan: SNF    Current Diagnoses: Patient Active Problem List   Diagnosis Date Noted   Fall    Closed left femoral fracture (HCC) 06/01/2019   Essential hypertension 06/01/2019   CKD (chronic kidney disease) stage 3, GFR 30-59 ml/min 06/01/2019   Left knee injury 08/21/2012   Right foot pain 07/24/2012    Orientation RESPIRATION BLADDER Height & Weight        Normal Continent Weight: 52.2 kg Height:  4\' 11"  (149.9 cm)  BEHAVIORAL SYMPTOMS/MOOD NEUROLOGICAL BOWEL NUTRITION STATUS      Continent Diet(refer to d/c summary)  AMBULATORY STATUS COMMUNICATION OF NEEDS Skin   Extensive Assist Verbally Surgical wounds(- s/p  IM nailing of L femoral fx, 5/30)                       Personal Care Assistance Level of Assistance  Bathing, Feeding, Dressing Bathing Assistance: Maximum assistance Feeding assistance: Independent Dressing Assistance: Maximum assistance     Functional Limitations Info  Sight, Speech, Hearing Sight Info: Adequate Hearing Info: Adequate Speech Info: Adequate    SPECIAL CARE FACTORS FREQUENCY  PT (By licensed PT), OT (By licensed OT)     PT Frequency: 5x/week, evaluate and treat OT Frequency: 5x/week, evaluate and treat            Contractures Contractures Info: Not present    Additional Factors  Info  Allergies, Code Status Code Status Info: Full code Allergies Info: Codeine, Erythromycin           Current Medications (06/05/2019):  This is the current hospital active medication list Current Facility-Administered Medications  Medication Dose Route Frequency Provider Last Rate Last Admin   0.9 %  sodium chloride infusion   Intravenous Continuous 08/05/2019, MD 10 mL/hr at 06/04/19 1143 New Bag at 06/04/19 1143   acetaminophen (TYLENOL) tablet 325-650 mg  325-650 mg Oral Q6H PRN 08/04/19, MD       amLODipine (NORVASC) tablet 5 mg  5 mg Oral Daily Nadara Mustard, MD   5 mg at 06/05/19 08/05/19   aspirin EC tablet 81 mg  81 mg Oral Daily 5102, MD   81 mg at 06/05/19 08/05/19   bisacodyl (DULCOLAX) suppository 10 mg  10 mg Rectal Daily PRN 5852, MD       cholecalciferol (VITAMIN D3) tablet 2,000 Units  2,000 Units Oral Daily Nadara Mustard, MD   2,000 Units at 06/05/19 0829   docusate sodium (COLACE) capsule 100 mg  100 mg Oral BID 08/05/19, MD   100 mg at 06/05/19 08/05/19   fenofibrate tablet 160 mg  160 mg Oral Daily 7782, MD   160 mg at 06/05/19 0829   fluticasone (FLONASE) 50 MCG/ACT nasal spray 1 spray  1 spray Each  Nare Daily Darliss Cheney, MD   1 spray at 06/05/19 0831   gabapentin (NEURONTIN) capsule 200 mg  200 mg Oral QHS Newt Minion, MD   200 mg at 06/04/19 2320   HYDROcodone-acetaminophen (NORCO) 7.5-325 MG per tablet 1-2 tablet  1-2 tablet Oral Q4H PRN Newt Minion, MD       HYDROcodone-acetaminophen (NORCO/VICODIN) 5-325 MG per tablet 1-2 tablet  1-2 tablet Oral Q4H PRN Newt Minion, MD   2 tablet at 06/05/19 2585   magnesium citrate solution 1 Bottle  1 Bottle Oral Once PRN Newt Minion, MD       magnesium oxide (MAG-OX) tablet 200 mg  200 mg Oral QHS Pahwani, Einar Grad, MD   200 mg at 06/04/19 2321   methocarbamol (ROBAXIN) tablet 500 mg  500 mg Oral Q6H PRN Newt Minion, MD   500 mg at 06/03/19 1206   Or    methocarbamol (ROBAXIN) 500 mg in dextrose 5 % 50 mL IVPB  500 mg Intravenous Q6H PRN Newt Minion, MD       methocarbamol (ROBAXIN) tablet 500 mg  500 mg Oral BID PRN Newt Minion, MD   500 mg at 06/02/19 2208   metoCLOPramide (REGLAN) tablet 5-10 mg  5-10 mg Oral Q8H PRN Newt Minion, MD       Or   metoCLOPramide (REGLAN) injection 5-10 mg  5-10 mg Intravenous Q8H PRN Newt Minion, MD       morphine 2 MG/ML injection 0.5-1 mg  0.5-1 mg Intravenous Q2H PRN Newt Minion, MD       multivitamin (PROSIGHT) tablet 1 tablet  1 tablet Oral Daily Darliss Cheney, MD   1 tablet at 06/05/19 0829   ondansetron (ZOFRAN) tablet 4 mg  4 mg Oral Q6H PRN Newt Minion, MD       Or   ondansetron [facility]) injection 4 mg  4 mg Intravenous Q6H PRN Newt Minion, MD       polyethylene glycol (MIRALAX / GLYCOLAX) packet 17 g  17 g Oral Daily PRN Newt Minion, MD       senna-docusate (Senokot-S) tablet 1 tablet  1 tablet Oral QHS PRN Newt Minion, MD       sertraline (ZOLOFT) tablet 50 mg  50 mg Oral Daily Newt Minion, MD   50 mg at 06/05/19 0829   temazepam (RESTORIL) capsule 15 mg  15 mg Oral QHS Newt Minion, MD   15 mg at 06/04/19 2320     Discharge Medications: Please see discharge summary for a list of discharge medications.  Relevant Imaging Results:  Relevant Lab Results:   Additional Information SS # [id number]  Sharin Mons, RN

## 2019-06-05 NOTE — Plan of Care (Signed)

## 2019-06-05 NOTE — Plan of Care (Signed)

## 2019-06-05 NOTE — Progress Notes (Signed)
Physical Therapy Treatment Patient Details Name: Julia Mora MRN: [id number] DOB: 17-Oct-1950 Today's Date: 06/05/2019    History of Present Illness Pt is a 69 y/o female who [place] a L femoral fracture. Pt is now s/p IM nailing on 5/30 and is now WBAT.     PT Comments    Pt progressing towards physical therapy goals, however slower than originally anticipated. Focus of session was stair training, and pt required min assist and significant time to complete. Pt demonstrating several instances of getting "stuck" and having to concentrate and pause before she could initiate movement with the LE's. I noted this happening more with moving the RLE but it did happen with the LLE as well. Because of this, getting to and from the toilet in the room was a very long process and she had some urgency incontinence. Based on continued performance during PT sessions, feel this pt is still at a high risk for falls and would benefit from continued PT services at the SNF level in preparation for return home with husband. Pt reports that husband is available for 24 hour supervision but not able to physically assist due to prior cervical surgeries. Pt agreeable to SNF level rehab at d/c, and PT updated RN and CSW after session. Will continue to follow.    Follow Up Recommendations  SNF;Supervision for mobility/OOB     Equipment Recommendations  Rolling walker with 5" wheels(Youth size - has RW but not sure if it is standard or youth)    Recommendations for Other Services       Precautions / Restrictions Precautions Precautions: Fall Restrictions Weight Bearing Restrictions: Yes LLE Weight Bearing: Weight bearing as tolerated    Mobility  Bed Mobility               General bed mobility comments: Pt was received sitting up in recliner chair upon PT arrival.   Transfers Overall transfer level: Needs assistance Equipment used: Rolling walker (2  wheeled) Transfers: Sit to/from Stand Sit to Stand: Min guard         General transfer comment: Hands on guarding for safety as pt powered up to full standing position. Pt was able to power-up without physical assist. VC's for proper hand placement on seated surface for safety.   Ambulation/Gait Ambulation/Gait assistance: Min guard Gait Distance (Feet): 75 Feet Assistive device: Rolling walker (2 wheeled) Gait Pattern/deviations: Step-to pattern;Decreased stride length;Trunk flexed;Narrow base of support;Step-through pattern Gait velocity: Decreased Gait velocity interpretation: <1.8 ft/sec, indicate of risk for recurrent falls General Gait Details: Increased time. Noted pt getting "stuck" several times in which pt had to concentrate to initiate LE movement. Some of the pauses were quite long.    Stairs Stairs: Yes Stairs assistance: Min assist Stair Management: Two rails;Step to pattern;Forwards Number of Stairs: 5 General stair comments: VC's for sequencing and safety. Pt required significant increased time to negotiate stairs, and "got stuck" several times, stating "I know what I have to do, I just can't make my legs do it." Ascending appeared more difficult than descending, but able to complete with min assist.    Wheelchair Mobility    Modified Rankin (Stroke Patients Only)       Balance Overall balance assessment: Needs assistance Sitting-balance support: Single extremity supported;Feet supported Sitting balance-Leahy Scale: Poor Sitting balance - Comments: Single extremity support utilized due to pain   Standing balance support: Bilateral upper extremity supported Standing balance-Leahy Scale: Poor Standing balance comment:  Required UE support on counter to wash hands.                            Cognition Arousal/Alertness: Awake/alert Behavior During Therapy: WFL for tasks assessed/performed Overall Cognitive Status: Within Functional Limits for  tasks assessed                                        Exercises      General Comments        Pertinent Vitals/Pain Pain Assessment: 0-10 Pain Score: 5  Pain Location: LLE Pain Descriptors / Indicators: Aching Pain Intervention(s): Limited activity within patient's tolerance;Monitored during session;Repositioned;RN gave pain meds during session    Home Living                      Prior Function            PT Goals (current goals can now be found in the care plan section) Acute Rehab PT Goals Patient Stated Goal: return home with husband PT Goal Formulation: With patient Time For Goal Achievement: 06/10/19 Potential to Achieve Goals: Good Progress towards PT goals: Progressing toward goals    Frequency    Min 5X/week      PT Plan Current plan remains appropriate    Co-evaluation              AM-PAC PT "6 Clicks" Mobility   Outcome Measure  Help needed turning from your back to your side while in a flat bed without using bedrails?: A Little Help needed moving from lying on your back to sitting on the side of a flat bed without using bedrails?: A Little Help needed moving to and from a bed to a chair (including a wheelchair)?: A Little Help needed standing up from a chair using your arms (e.g., wheelchair or bedside chair)?: A Little Help needed to walk in hospital room?: A Little Help needed climbing 3-5 steps with a railing? : A Lot 6 Click Score: 17    End of Session Equipment Utilized During Treatment: Gait belt Activity Tolerance: Patient limited by pain Patient left: in chair;with call bell/phone within reach Nurse Communication: Mobility status PT Visit Diagnosis: Pain;Other abnormalities of gait and mobility (R26.89) Pain - Right/Left: Left Pain - part of body: Hip     Time: 5400-8676 PT Time Calculation (min) (ACUTE ONLY): 51 min  Charges:  $Gait Training: 38-52 mins                     Conni Slipper, PT,  DPT Acute Rehabilitation Services Pager: [phone] Office: [phone]    Marylynn Pearson 06/05/2019, 11:15 AM

## 2019-06-05 NOTE — TOC CAGE-AID Note (Signed)
Transition of Care [facility]) - CAGE-AID Screening   Patient Details  Name: Julia Mora MRN: [id number] Date of Birth: 1950-12-23  [facility]) CM/SW Contact:    Jimmy Picket, [place] Phone Number: 06/05/2019, 2:58 PM   Clinical Narrative: Pt denied alcohol use and substance use.    CAGE-AID Screening:    Have You Ever Felt You Ought to Cut Down on Your Drinking or Drug Use?: No Have People Annoyed You By Critizing Your Drinking Or Drug Use?: No Have You Felt Bad Or Guilty About Your Drinking Or Drug Use?: No Have You Ever Had a Drink or Used Drugs First Thing In The Morning to STeady Your Nerves or to Get Rid of a Hangover?: No CAGE-AID Score: 0  Substance Abuse Education Offered: No    Denita Lung, Bridget Hartshorn Clinical Social Worker [phone]

## 2019-06-06 LAB — CBC
HCT: 25.7 % — ABNORMAL LOW (ref 36.0–46.0)
Hemoglobin: 8.2 g/dL — ABNORMAL LOW (ref 12.0–15.0)
MCH: 28.4 pg (ref 26.0–34.0)
MCHC: 31.9 g/dL (ref 30.0–36.0)
MCV: 88.9 fL (ref 80.0–100.0)
Platelets: 257 10*3/uL (ref 150–400)
RBC: 2.89 MIL/uL — ABNORMAL LOW (ref 3.87–5.11)
RDW: 15 % (ref 11.5–15.5)
WBC: 5 10*3/uL (ref 4.0–10.5)
nRBC: 0.4 % — ABNORMAL HIGH (ref 0.0–0.2)

## 2019-06-06 LAB — SARS CORONAVIRUS 2 (TAT 6-24 HRS): SARS Coronavirus 2: NEGATIVE

## 2019-06-06 MED ORDER — LOSARTAN POTASSIUM 50 MG PO TABS
50.0000 mg | ORAL_TABLET | Freq: Every day | ORAL | Status: DC
  Administered 2019-06-07: 50 mg via ORAL
  Filled 2019-06-06: qty 1

## 2019-06-06 MED ORDER — DOCUSATE SODIUM 100 MG PO CAPS: 100.0000 mg | ORAL_CAPSULE | Freq: Two times a day (BID) | ORAL | 0 refills | Status: DC

## 2019-06-06 NOTE — Progress Notes (Signed)
Physical Therapy Treatment Patient Details Name: Julia Mora MRN: [id number] DOB: May 22, 1950 Today's Date: 06/06/2019    History of Present Illness Pt is a 69 y/o female who [place], sustaining a L femoral fracture. Pt is now s/p IM nailing on 5/30 and is now WBAT.     PT Comments    Pt progressing towards physical therapy goals. Was able to perform transfers and ambulation with gross supervision for safety this session. Pt disclosed that she was having LLE weakness prior to fall due to issues with her back (could not remember imaging results and not mentioned in chart), and had many questions regarding reasonable expectations for recovery. We discussed benefits of short term rehab at the SNF level and what her PT course may look like. Will continue to follow and progress as able per POC.      Follow Up Recommendations  SNF;Supervision for mobility/OOB     Equipment Recommendations  Rolling walker with 5" wheels(Youth size - has RW but not sure if it is standard or youth)    Recommendations for Other Services       Precautions / Restrictions Precautions Precautions: Fall Restrictions Weight Bearing Restrictions: Yes LLE Weight Bearing: Weight bearing as tolerated    Mobility  Bed Mobility               General bed mobility comments: Pt was received sitting up in recliner chair upon PT arrival.   Transfers Overall transfer level: Needs assistance Equipment used: Rolling walker (2 wheeled) Transfers: Sit to/from Stand Sit to Stand: Supervision         General transfer comment: Supervision for safety as pt powered up to full standing position. Pt was able to power-up without physical assist. Pt demonstrated proper hand placement on seated surface for safety.   Ambulation/Gait Ambulation/Gait assistance: Supervision;Min guard Gait Distance (Feet): 125 Feet Assistive device: Rolling walker (2 wheeled) Gait Pattern/deviations:  Step-to pattern;Decreased stride length;Trunk flexed;Narrow base of support;Step-through pattern Gait velocity: Decreased Gait velocity interpretation: <1.8 ft/sec, indicate of risk for recurrent falls General Gait Details: Continues to demonstrate decreased gait speed but did not note any instances when pt appears to freeze and have difficulty initiating movement.    Stairs             Wheelchair Mobility    Modified Rankin (Stroke Patients Only)       Balance Overall balance assessment: Needs assistance Sitting-balance support: Single extremity supported;Feet supported Sitting balance-Leahy Scale: Poor Sitting balance - Comments: Single extremity support utilized due to pain   Standing balance support: Bilateral upper extremity supported Standing balance-Leahy Scale: Poor Standing balance comment: Required UE support on counter to wash hands.                            Cognition Arousal/Alertness: Awake/alert Behavior During Therapy: WFL for tasks assessed/performed Overall Cognitive Status: Within Functional Limits for tasks assessed                                        Exercises      General Comments        Pertinent Vitals/Pain Pain Assessment: 0-10 Pain Score: 4  Pain Location: LLE Pain Descriptors / Indicators: Aching;Operative site guarding Pain Intervention(s): Limited activity within patient's tolerance;Monitored during session;Repositioned    Home Living  Prior Function            PT Goals (current goals can now be found in the care plan section) Acute Rehab PT Goals Patient Stated Goal: return home with husband PT Goal Formulation: With patient Time For Goal Achievement: 06/10/19 Potential to Achieve Goals: Good Progress towards PT goals: Progressing toward goals    Frequency    Min 5X/week      PT Plan Current plan remains appropriate    Co-evaluation               AM-PAC PT "6 Clicks" Mobility   Outcome Measure  Help needed turning from your back to your side while in a flat bed without using bedrails?: A Little Help needed moving from lying on your back to sitting on the side of a flat bed without using bedrails?: A Little Help needed moving to and from a bed to a chair (including a wheelchair)?: A Little Help needed standing up from a chair using your arms (e.g., wheelchair or bedside chair)?: A Little Help needed to walk in hospital room?: A Little Help needed climbing 3-5 steps with a railing? : A Lot 6 Click Score: 17    End of Session Equipment Utilized During Treatment: Gait belt Activity Tolerance: Patient limited by pain Patient left: in chair;with call bell/phone within reach Nurse Communication: Mobility status PT Visit Diagnosis: Pain;Other abnormalities of gait and mobility (R26.89) Pain - Right/Left: Left Pain - part of body: Hip     Time: 1205-1242 PT Time Calculation (min) (ACUTE ONLY): 37 min  Charges:  $Gait Training: 23-37 mins                     Julia Mora, PT, DPT Acute Rehabilitation Services Pager: [phone] Office: [phone]    Marylynn Pearson 06/06/2019, 1:25 PM

## 2019-06-06 NOTE — Discharge Summary (Addendum)
Physician Discharge Summary  Julia Mora UJW:[id number] DOB: February 16, 1950 DOA: 06/01/2019  PCP: Aida PufferLittle, James, MD  Admit date: 06/01/2019 Discharge date: 06/07/2019  Admitted From: home Discharge disposition: facility   Recommendations for Outpatient Follow-Up:   Follow up with ortho 1 week after discharge Recommend cbc 1 week to evaluate Hg.  Take medications as prescribed   Discharge Diagnosis:   Principal Problem:   Closed left femoral fracture (HCC) Active Problems:   Essential hypertension   CKD (chronic kidney disease) stage 3, GFR 30-59 ml/min   Fall    Discharge Condition: Improved.  Diet recommendation: Low sodium, heart healthy.   Wound care: None.  Code status: Full.   History of Present Illness:   Julia Mora is a 69 y.o. female with medical history significant for hypertension, CKD stage III, anxiety, osteoporosis, cervical radiculopathy, lumbar degenerative disc disease who presented to the ED on 5/29 for evaluation of left hip pain. Patient stated she was walking at the park when she misstepped into about a 1 foot deep hole. She got her leg twisted around and that she fell onto the sidewalk on her left hip. She had immediate pain and was unable to bear weight on her own. She reported she was able to otherwise brace her fall with her arms and did not have any significant injury to her head. She did not lose consciousness. She denied any recent chest pain, palpitations, dyspnea, cough, abdominal pain, nausea, vomiting, or dysuria.     Hospital Course by Problem:    #1.  Displaced left femur fracture.  As a result of a mechanical fall.  Underwent surgery May 30. Pain managed well at discharge. Asa 81mg  daily per ortho. Is to follow up with ortho in 1 week. Weight bearing as tolerated. Evaluated by PT who recommend rehab. Patient agreeable.    . 2 Acute kidney injury superimposed on chronic kidney disease stage III.  Baseline creatinine is  1-1.2.  Slightly increased on admission.  She was provided with IV fluids and creatinine improved.  Taking fluids and food well.   #3.  Hypertension.  Home medications include amlodipine and losartan.  Losartan was on hold due to #2.  home meds resumed at discharge   #4.  Depression/anxiety.  Stable at baseline. Continue home meds   #5.  Acute blood loss anemia secondary to surgery.  On admission her hemoglobin was 13.  It dropped to 7.1 postoperatively.  She was provided with 1 unit of packed red blood cells on June 1. Hg 8.7 6/1. Recommend cbc 1 week to evaluate Hg.    Medical Consultants:   Lajoyce Cornersuda ortho   Discharge Exam:   Vitals:   06/06/19 0412 06/06/19 0748  BP: 136/85 (!) 139/97  Pulse: 80 85  Resp: 16 14  Temp: 98.6 F (37 C) 98.4 F (36.9 C)  SpO2: 95% 95%   Vitals:   06/05/19 1501 06/05/19 1950 06/06/19 0412 06/06/19 0748  BP: 116/74 (!) 144/89 136/85 (!) 139/97  Pulse: 87 89 80 85  Resp: 15 16 16 14   Temp: 98.4 F (36.9 C) 99.2 F (37.3 C) 98.6 F (37 C) 98.4 F (36.9 C)  TempSrc: Oral Oral Oral Oral  SpO2: 95% 99% 95% 95%  Weight:      Height:        General exam: Appears calm and comfortable. Sitting in chair visiting with friend no acute distress Respiratory system: Clear to auscultation. Respiratory effort normal. Cardiovascular system: S1 &  S2 heard, RRR. No JVD,  rubs, gallops or clicks. No murmurs. Gastrointestinal system: Abdomen is nondistended, soft and nontender. No organomegaly or masses felt. Normal bowel sounds heard. Central nervous system: Alert and oriented. No focal neurological deficits. Extremities: No clubbing,  or cyanosis. No edema. Skin: No rashes, lesions or ulcers. Psychiatry: Judgement and insight appear normal. Mood & affect appropriate.    The results of significant diagnostics from this hospitalization (including imaging, microbiology, ancillary and laboratory) are listed below for reference.     Procedures and Diagnostic  Studies:   Chest Portable 1 View  Result Date: 06/01/2019 CLINICAL DATA:  Preoperative examination. EXAM: PORTABLE CHEST 1 VIEW COMPARISON:  None. FINDINGS: The heart size and mediastinal contours are within normal limits. Both lungs are clear. The visualized skeletal structures are unremarkable. IMPRESSION: No active disease. Electronically Signed   By: Gerome Sam III M.D   On: 06/01/2019 21:49   DG C-Arm 1-60 Min-No Report  Result Date: 06/02/2019 Fluoroscopy was utilized by the requesting physician.  No radiographic interpretation.   DG Femur Portable Min 2 Views Left  Result Date: 06/01/2019 CLINICAL DATA:  69 year old female with fall and left lower extremity deformity. EXAM: LEFT FEMUR PORTABLE 2 VIEWS COMPARISON:  None. FINDINGS: There is a displaced and angulated fracture of the proximal left femoral diaphysis. There is medial angulation of the distal fracture fragment and approximately 15 mm overlap. There is no dislocation. The bones are well mineralized. The soft tissues are grossly unremarkable. IMPRESSION: Displaced and angulated fracture of the proximal left femoral diaphysis. Electronically Signed   By: Elgie Collard M.D.   On: 06/01/2019 19:41     Labs:   Basic Metabolic Panel: Recent Labs  Lab 06/01/19 1845 06/01/19 1845 06/02/19 0335 06/02/19 0335 06/03/19 1041 06/04/19 0335  NA 138  --  138  --  134* 137  K 4.5   < > 4.3   < > 3.7 3.9  CL 98  --  107  --  102 104  CO2 25  --  23  --  21* 27  GLUCOSE 118*  --  132*  --  123* 99  BUN 33*  --  24*  --  22 23  CREATININE 1.54*  --  1.16*  --  1.27* 1.07*  CALCIUM 10.6*  --  9.3  --  8.2* 8.5*   < > = values in this interval not displayed.   GFR Estimated Creatinine Clearance: 37.2 mL/min (A) (by C-G formula based on SCr of 1.07 mg/dL (H)). Liver Function Tests: Recent Labs  Lab 06/01/19 1845  AST 31  ALT 15  ALKPHOS 40  BILITOT 0.6  PROT 7.5  ALBUMIN 4.0   No results for input(s): LIPASE,  AMYLASE in the last 168 hours. No results for input(s): AMMONIA in the last 168 hours. Coagulation profile No results for input(s): INR, PROTIME in the last 168 hours.  CBC: Recent Labs  Lab 06/01/19 1845 06/01/19 1845 06/02/19 0335 06/03/19 1041 06/04/19 0335 06/04/19 1902 06/06/19 0336  WBC 5.8  --  7.9 7.4 5.8  --  5.0  NEUTROABS 4.5  --   --  5.8 4.3  --   --   HGB 13.0   < > 11.1* 8.0* 7.1* 8.7* 8.2*  HCT 41.6   < > 35.1* 25.3* 22.3* 26.6* 25.7*  MCV 88.3  --  88.6 90.0 88.8  --  88.9  PLT 394  --  339 235 214  --  257   < > =  values in this interval not displayed.   Cardiac Enzymes: No results for input(s): CKTOTAL, CKMB, CKMBINDEX, TROPONINI in the last 168 hours. BNP: Invalid input(s): POCBNP CBG: Recent Labs  Lab 06/01/19 1828  GLUCAP 107*   D-Dimer No results for input(s): DDIMER in the last 72 hours. Hgb A1c No results for input(s): HGBA1C in the last 72 hours. Lipid Profile No results for input(s): CHOL, HDL, LDLCALC, TRIG, CHOLHDL, LDLDIRECT in the last 72 hours. Thyroid function studies No results for input(s): TSH, T4TOTAL, T3FREE, THYROIDAB in the last 72 hours.  Invalid input(s): FREET3 Anemia work up No results for input(s): VITAMINB12, FOLATE, FERRITIN, TIBC, IRON, RETICCTPCT in the last 72 hours. Microbiology Recent Results (from the past 240 hour(s))  SARS Coronavirus 2 by RT PCR (hospital order, performed in [facility] hospital lab) Nasopharyngeal Nasopharyngeal Swab     Status: None   Collection Time: 06/01/19  6:28 PM   Specimen: Nasopharyngeal Swab  Result Value Ref Range Status   SARS Coronavirus 2 NEGATIVE NEGATIVE Final    Comment: (NOTE) SARS-CoV-2 target nucleic acids are NOT DETECTED. The SARS-CoV-2 RNA is generally detectable in upper and lower respiratory specimens during the acute phase of infection. The lowest concentration of SARS-CoV-2 viral copies this assay can detect is 250 copies / mL. A negative result does not  preclude SARS-CoV-2 infection and should not be used as the sole basis for treatment or other patient management decisions.  A negative result may occur with improper specimen collection / handling, submission of specimen other than nasopharyngeal swab, presence of viral mutation(s) within the areas targeted by this assay, and inadequate number of viral copies (<250 copies / mL). A negative result must be combined with clinical observations, patient history, and epidemiological information. Fact Sheet for Patients:   StrictlyIdeas.no Fact Sheet for Healthcare Providers: BankingDealers.co.za This test is not yet approved or cleared  by the [place] FDA and has been authorized for detection and/or diagnosis of SARS-CoV-2 by FDA under an Emergency Use Authorization (EUA).  This EUA will remain in effect (meaning this test can be used) for the duration of the COVID-19 declaration under Section 564(b)(1) of the Act, 21 U.S.C. section 360bbb-3(b)(1), unless the authorization is terminated or revoked sooner. Performed at [facility], [place] [address] [place], [place] [address]   Surgical pcr screen     Status: None   Collection Time: 06/01/19  9:24 PM   Specimen: Nasal Mucosa; Nasal Swab  Result Value Ref Range Status   MRSA, PCR NEGATIVE NEGATIVE Final   Staphylococcus aureus NEGATIVE NEGATIVE Final    Comment: (NOTE) The Xpert SA Assay (FDA approved for NASAL specimens in patients 49 years of age and older), is one component of a comprehensive surveillance program. It is not intended to diagnose infection nor to guide or monitor treatment. Performed at [facility], [place] [address], [place], [place] [address]   SARS CORONAVIRUS 2 (TAT 6-24 HRS) Nasopharyngeal Nasopharyngeal Swab     Status: None   Collection Time: 06/06/19  3:33 AM   Specimen: Nasopharyngeal Swab  Result Value Ref Range Status   SARS Coronavirus 2  NEGATIVE NEGATIVE Final    Comment: (NOTE) SARS-CoV-2 target nucleic acids are NOT DETECTED. The SARS-CoV-2 RNA is generally detectable in upper and lower respiratory specimens during the acute phase of infection. Negative results do not preclude SARS-CoV-2 infection, do not rule out co-infections with other pathogens, and should not be used as the sole basis for treatment  or other patient management decisions. Negative results must be combined with clinical observations, patient history, and epidemiological information. The expected result is Negative. Fact Sheet for Patients: HairSlick.no Fact Sheet for Healthcare Providers: quierodirigir.com This test is not yet approved or cleared by the [place] FDA and  has been authorized for detection and/or diagnosis of SARS-CoV-2 by FDA under an Emergency Use Authorization (EUA). This EUA will remain  in effect (meaning this test can be used) for the duration of the COVID-19 declaration under Section 56 4(b)(1) of the Act, 21 U.S.C. section 360bbb-3(b)(1), unless the authorization is terminated or revoked sooner. Performed at [facility], [address], [place], [place] [address]      Discharge Instructions:   Discharge Instructions     Call MD for:  persistant dizziness or light-headedness   Complete by: As directed    Call MD for:  temperature >100.4   Complete by: As directed    Diet - low sodium heart healthy   Complete by: As directed    Discharge instructions   Complete by: As directed    Weight bearing as tolerated  Follow up with ortho 1 week after discharge   Discharge wound care:   Complete by: As directed    Per ortho.   Increase activity slowly   Complete by: As directed    Weight bearing as tolerated   Complete by: As directed    Laterality: left   Extremity: Lower      Allergies as of 06/06/2019       Reactions   Codeine Other (See  Comments)   Abd pain   Erythromycin Other (See Comments)   Abd pain        Medication List     TAKE these medications    acetaminophen 500 MG tablet Commonly known as: TYLENOL Take 1 tablet (500 mg total) by mouth every 6 (six) hours as needed for mild pain. What changed: reasons to take this   amLODipine 5 MG tablet Commonly known as: NORVASC Take 5 mg by mouth daily.   aspirin EC 325 MG tablet Take 1 tablet (325 mg total) by mouth daily. What changed:  medication strength how much to take   docusate sodium 100 MG capsule Commonly known as: COLACE Take 1 capsule (100 mg total) by mouth 2 (two) times daily.   fenofibrate 145 MG tablet Commonly known as: TRICOR Take 145 mg by mouth daily.   fexofenadine 180 MG tablet Commonly known as: ALLEGRA Take 180 mg by mouth daily.   fluticasone 50 MCG/ACT nasal spray Commonly known as: FLONASE Place 1 spray into both nostrils daily.   gabapentin 100 MG capsule Commonly known as: NEURONTIN Take 200 mg by mouth at bedtime.   guaiFENesin 600 MG 12 hr tablet Commonly known as: MUCINEX Take 600 mg by mouth daily.   losartan 50 MG tablet Commonly known as: COZAAR Take 50 mg by mouth 2 (two) times daily.   Magnesium 250 MG Tabs Take 250 mg by mouth at bedtime.   methocarbamol 500 MG tablet Commonly known as: ROBAXIN Take 500 mg by mouth 2 (two) times daily.   OVER THE COUNTER MEDICATION Take 1 tablet by mouth daily. Citracal plus D3 and zinc  - Calcium 650 mg, Vitamin D3 1000 units, Zinc 5.5 mg   PreserVision AREDS 2 Caps Take 1 capsule by mouth 2 (two) times daily.   sertraline 50 MG tablet Commonly known as: ZOLOFT Take 50 mg by mouth daily.  temazepam 15 MG capsule Commonly known as: RESTORIL Take 15 mg by mouth at bedtime.   Vitamin D3 50 MCG (2000 UT) Tabs Take 2,000 Units by mouth daily.   zoledronic acid 5 MG/100ML Soln injection Commonly known as: RECLAST Inject 5 mg into the vein See admin  instructions. Administered yearly at [facility].               Discharge Care Instructions  (From admission, onward)           Start     Ordered   06/06/19 0000  Discharge wound care:    Comments: Per ortho.   06/06/19 1337   06/03/19 0000  Weight bearing as tolerated    Question Answer Comment  Laterality left   Extremity Lower      06/03/19 8563           Follow-up Information     Nadara Mustard, MD In 1 week.   Specialty: Orthopedic Surgery Contact information: [address] [place] [address] [phone]             Time coordinating discharge: 40 minutes  Signed:  Gwenyth Bender NP  [facility] 06/06/2019, 1:38 PM

## 2019-06-06 NOTE — TOC Progression Note (Addendum)
Transition of Care [facility]) - Progression Note    Patient Details  Name: Julia Mora MRN: [id number] Date of Birth: Dec 23, 1950  [facility] CM/SW Contact  Epifanio Lesches, RN Phone Number: 06/06/2019, 10:12 AM  Clinical Narrative:    Awaiting insurance authorization for SNF placement. COVID pending . Bed offer extended and accepted by pt for [facility].   TOC team monitoring for needs ....  Expected Discharge Plan: Skilled Nursing Facility Barriers to Discharge: English as a second language teacher  Expected Discharge Plan and Services Expected Discharge Plan: Skilled Nursing Facility   Discharge Planning Services: CM Consult                                           Social Determinants of Health (SDOH) Interventions    Readmission Risk Interventions No flowsheet data found.

## 2019-06-06 NOTE — Plan of Care (Signed)

## 2019-06-07 ENCOUNTER — Inpatient Hospital Stay (HOSPITAL_COMMUNITY): Payer: Medicare PPO

## 2019-06-07 NOTE — Plan of Care (Signed)
  Problem: Education: Goal: Knowledge of General Education information will improve Description: Including pain rating scale, medication(s)/side effects and non-pharmacologic comfort measures Outcome: Progressing   Problem: Health Behavior/Discharge Planning: Goal: Ability to manage health-related needs will improve Outcome: Progressing   Problem: Clinical Measurements: Goal: Ability to maintain clinical measurements within normal limits will improve Outcome: Progressing Goal: Will remain free from infection Outcome: Progressing Goal: Diagnostic test results will improve Outcome: Progressing   Problem: Health Behavior/Discharge Planning: Goal: Ability to manage health-related needs will improve Outcome: Progressing   Problem: Health Behavior/Discharge Planning: Goal: Ability to manage health-related needs will improve Outcome: Progressing   Problem: Education: Goal: Knowledge of General Education information will improve Description: Including pain rating scale, medication(s)/side effects and non-pharmacologic comfort measures Outcome: Progressing   Problem: Health Behavior/Discharge Planning: Goal: Ability to manage health-related needs will improve Outcome: Progressing   Problem: Clinical Measurements: Goal: Ability to maintain clinical measurements within normal limits will improve Outcome: Progressing Goal: Will remain free from infection Outcome: Progressing Goal: Diagnostic test results will improve Outcome: Progressing

## 2019-06-07 NOTE — Progress Notes (Addendum)
Please see d/c summary from 6/3.  No changes made overnight. Will need: Whole-body bone scan can be obtained to further Evaluate Sclerotic densities in the left sacrum and left pubis. Although these may represent bone islands, blastic metastatic disease cannot be excluded Marlin Canary, DO

## 2019-06-07 NOTE — Plan of Care (Signed)
Patient discharge to [facility].

## 2019-06-07 NOTE — TOC Transition Note (Addendum)
Transition of Care [facility]) - CM/SW Discharge Note   Patient Details  Name: Julia Mora MRN: [id number] Date of Birth: [birth year]  Transition [facility]) CM/SW Contact:  Epifanio Lesches, RN Phone Number: [phone] 06/07/2019, 9:03 AM   Clinical Narrative:     NCM received SNF authorization from [facility], [id number] x 5 days , next review 06/11/2019. CM Earlene Plater. Reference ID [id number].   Patient will DC to: [facility] Anticipated DC date: 06/07/2019 Family notified: Husband Transport [id number]   Per MD patient ready for DC today . RN, patient, patient's family, and facility notified of DC. Discharge Summary and FL2 sent to facility. RN to call report prior to discharge [phone]). DC packet on chart. Ambulance transport be requested for patient once xray has resulted and ortho MD gives ok.   TOC team will continue to monitor ....  06/07/2019 @ 1400 NCM received call from bedside nurse. Pt ok to d/c to SNF. Arrangements made with  [facility] for transportation to [facility]  Final next level of care: Skilled Nursing Facility([facility]) Barriers to Discharge: No Barriers Identified   Patient Goals and CMS Choice        Discharge Placement                       Discharge Plan and Services   Discharge Planning Services: CM Consult                                 Social Determinants of Health (SDOH) Interventions     Readmission Risk Interventions No flowsheet data found.

## 2019-06-07 NOTE — Progress Notes (Signed)
Report given to Venita Sheffield, Charity fundraiser.  [employer]. Discharge package given to [facility].

## 2019-06-07 NOTE — Progress Notes (Signed)
Physical Therapy Treatment Patient Details Name: Julia Mora MRN: [id number] DOB: September 05, 1950 Today's Date: 06/07/2019    History of Present Illness Pt is a 69 y/o female who [place] a L femoral fracture. Pt is now s/p IM nailing on 5/30 and is now WBAT.     PT Comments    Pt progressing towards physical therapy goals. Was able to perform transfers and ambulation with min guard assist to supervision for safety. Pt reports popping sensation in LLE during mobility, but states it is not painful and does not report increase in pain during weight bearing activity. SNF level rehab remains appropriate at d/c. Reinforced safety awareness and HEP. Will continue to follow and progress as able per POC.    Follow Up Recommendations  SNF;Supervision for mobility/OOB     Equipment Recommendations  Rolling walker with 5" wheels(Youth size - has RW but not sure if it is standard or youth)    Recommendations for Other Services       Precautions / Restrictions Precautions Precautions: Fall Restrictions Weight Bearing Restrictions: Yes LLE Weight Bearing: Weight bearing as tolerated    Mobility  Bed Mobility               General bed mobility comments: Pt was received sitting up in recliner chair upon PT arrival.   Transfers Overall transfer level: Needs assistance Equipment used: Rolling walker (2 wheeled) Transfers: Sit to/from Stand Sit to Stand: Supervision         General transfer comment: Supervision for safety as pt powered up to full standing position. Pt was able to power-up without physical assist. Pt demonstrated proper hand placement on seated surface for safety.   Ambulation/Gait Ambulation/Gait assistance: Supervision;Min guard Gait Distance (Feet): 150 Feet Assistive device: Rolling walker (2 wheeled) Gait Pattern/deviations: Step-to pattern;Decreased stride length;Trunk flexed;Narrow base of support;Step-through  pattern Gait velocity: Decreased Gait velocity interpretation: <1.8 ft/sec, indicate of risk for recurrent falls General Gait Details: Pt reporting a popping sensation during gait - noted x-rays taken this morning but no changes to POC noted in chart. Gait was to tolerance and pt did not report any increase in pain with weightbearing.    Stairs             Wheelchair Mobility    Modified Rankin (Stroke Patients Only)       Balance Overall balance assessment: Needs assistance Sitting-balance support: Single extremity supported;Feet supported Sitting balance-Leahy Scale: Poor Sitting balance - Comments: Single extremity support utilized due to pain   Standing balance support: Bilateral upper extremity supported Standing balance-Leahy Scale: Poor Standing balance comment: Required UE support on counter to wash hands.                            Cognition Arousal/Alertness: Awake/alert Behavior During Therapy: WFL for tasks assessed/performed Overall Cognitive Status: Within Functional Limits for tasks assessed                                        Exercises General Exercises - Lower Extremity Quad Sets: 10 reps Short Arc Quad: 10 reps Long Arc Quad: 10 reps Hip ABduction/ADduction: 10 reps    General Comments        Pertinent Vitals/Pain Pain Assessment: Faces Faces Pain Scale: Hurts little more Pain Location: LLE Pain Descriptors / Indicators:  Aching;Operative site guarding Pain Intervention(s): Limited activity within patient's tolerance;Monitored during session;Repositioned    Home Living                      Prior Function            PT Goals (current goals can now be found in the care plan section) Acute Rehab PT Goals Patient Stated Goal: return home with husband PT Goal Formulation: With patient Time For Goal Achievement: 06/10/19 Potential to Achieve Goals: Good Progress towards PT goals: Progressing toward  goals    Frequency    Min 5X/week      PT Plan Current plan remains appropriate    Co-evaluation              AM-PAC PT "6 Clicks" Mobility   Outcome Measure  Help needed turning from your back to your side while in a flat bed without using bedrails?: A Little Help needed moving from lying on your back to sitting on the side of a flat bed without using bedrails?: A Little Help needed moving to and from a bed to a chair (including a wheelchair)?: A Little Help needed standing up from a chair using your arms (e.g., wheelchair or bedside chair)?: A Little Help needed to walk in hospital room?: A Little Help needed climbing 3-5 steps with a railing? : A Lot 6 Click Score: 17    End of Session Equipment Utilized During Treatment: Gait belt Activity Tolerance: Patient limited by pain Patient left: in chair;with call bell/phone within reach Nurse Communication: Mobility status PT Visit Diagnosis: Pain;Other abnormalities of gait and mobility (R26.89) Pain - Right/Left: Left Pain - part of body: Hip     Time: 1135-1205 PT Time Calculation (min) (ACUTE ONLY): 30 min  Charges:  $Gait Training: 8-22 mins $Therapeutic Exercise: 8-22 mins                     Conni Slipper, PT, DPT Acute Rehabilitation Services Pager: [phone] Office: [phone]    Julia Mora 06/07/2019, 1:43 PM

## 2019-06-18 ENCOUNTER — Ambulatory Visit (INDEPENDENT_AMBULATORY_CARE_PROVIDER_SITE_OTHER): Payer: Medicare PPO | Admitting: Physician Assistant

## 2019-06-18 ENCOUNTER — Encounter: Payer: Self-pay | Admitting: Orthopedic Surgery

## 2019-06-18 ENCOUNTER — Ambulatory Visit: Payer: Self-pay

## 2019-06-18 VITALS — Ht 59.0 in | Wt 115.0 lb

## 2019-06-18 DIAGNOSIS — M25559 Pain in unspecified hip: Secondary | ICD-10-CM | POA: Diagnosis not present

## 2019-06-18 DIAGNOSIS — M25552 Pain in left hip: Secondary | ICD-10-CM | POA: Diagnosis not present

## 2019-06-18 NOTE — Progress Notes (Signed)
Office Visit Note   Patient: Julia Mora           Date of Birth: 26-Feb-1950           MRN: [id number] Visit Date: 06/18/2019              Requested by: Tamsen Roers, [place],  [place] [address] PCP: Tamsen Roers, MD  Chief Complaint  Patient presents with  . Left Leg - Routine Post Op    06/02/19 Left Femoral IM      HPI: Patient is 2 weeks status post left intramedullary rodding of a femur fracture.  She is doing well.  She has been at a nursing facility and progressing with rehab.  She has no complaints and expects to be discharged this week.  She is ambulating with a walker  Assessment & Plan: Visit Diagnoses:  1. Hip pain     Plan: Continue mobilization.  Hopefully she will discharge home with home PT.  She will follow-up up in 3 weeks for new x-rays.  Staples will be removed today I have advised her to wear a compression stocking which she has at home.  This will help with some of the swelling.  In her leg.  She also should continue to take baby aspirin daily  Follow-Up Instructions: No follow-ups on file.   Ortho Exam  Patient is alert, oriented, no adenopathy, well-dressed, normal affect, normal respiratory effort. Focused examination of her left leg demonstrates some bruising in her calf but compartments are soft and nontender well-healed surgical incisions.  No cellulitis no erythema  Imaging: No results found. No images are attached to the encounter.  Labs: No results found for: HGBA1C, ESRSEDRATE, CRP, LABURIC, REPTSTATUS, GRAMSTAIN, CULT, LABORGA   Lab Results  Component Value Date   ALBUMIN 4.0 06/01/2019   ALBUMIN 3.5 07/18/2007    Lab Results  Component Value Date   MG 3.4 (H) 07/19/2007   MG 3.7 (H) 07/18/2007   MG 3.9 (H) 07/18/2007   Lab Results  Component Value Date   VD25OH 44.71 06/01/2019    No results found for: PREALBUMIN CBC EXTENDED Latest Ref Rng & Units 06/06/2019 06/04/2019 06/04/2019  WBC 4.0 - 10.5 K/uL 5.0 -  5.8  RBC 3.87 - 5.11 MIL/uL 2.89(L) - 2.51(L)  HGB 12.0 - 15.0 g/dL 8.2(L) 8.7(L) 7.1(L)  HCT 36 - 46 % 25.7(L) 26.6(L) 22.3(L)  PLT 150 - 400 K/uL 257 - 214  NEUTROABS 1.7 - 7.7 K/uL - - 4.3  LYMPHSABS 0.7 - 4.0 K/uL - - 0.8     Body mass index is 23.23 kg/m.  Orders:  Orders Placed This Encounter  Procedures  . XR HIP UNILAT W OR W/O PELVIS 2-3 VIEWS LEFT   No orders of the defined types were placed in this encounter.    Procedures: No procedures performed  Clinical Data: No additional findings.  ROS:  All other systems negative, except as noted in the HPI. Review of Systems  Objective: Vital Signs: Ht 4\' 11"  (1.499 m)   Wt 115 lb (52.2 kg)   BMI 23.23 kg/m   Specialty Comments:  No specialty comments available.  PMFS History: Patient Active Problem List   Diagnosis Date Noted  . Fall   . Closed left femoral fracture ([facility]) 06/01/2019  . Essential hypertension 06/01/2019  . CKD (chronic kidney disease) stage 3, GFR 30-59 ml/min 06/01/2019  . Left knee injury 08/21/2012  . Right foot pain 07/24/2012  Past Medical History:  Diagnosis Date  . CKD (chronic kidney disease) stage 3, GFR 30-59 ml/min   . Hypertension   . Osteoporosis     Family History  Problem Relation Age of Onset  . Heart attack Father   . Diabetes Neg Hx   . Hyperlipidemia Neg Hx   . Hypertension Neg Hx     Past Surgical History:  Procedure Laterality Date  . ABDOMINAL HYSTERECTOMY    . ANUS SURGERY    . BLADDER SURGERY    . FEMUR IM NAIL Left 06/02/2019   Procedure: INTRAMEDULLARY (IM) NAIL FEMORAL;  Surgeon: Nadara Mustard, MD;  Location: [facility] OR;  Service: Orthopedics;  Laterality: Left;   Social History   Occupational History  . Not on file  Tobacco Use  . Smoking status: Never Smoker  . Smokeless tobacco: Never Used  Vaping Use  . Vaping Use: Never used  Substance and Sexual Activity  . Alcohol use: No  . Drug use: No  . Sexual activity: Not on file

## 2019-06-24 ENCOUNTER — Telehealth: Payer: Self-pay | Admitting: Orthopedic Surgery

## 2019-06-24 NOTE — Telephone Encounter (Signed)
I called and sw Crystal and advised verbal ok for PT orders as requested below. To call with any other questions.

## 2019-06-24 NOTE — Telephone Encounter (Signed)
Received call from Crystal (PT) with [facility] needing verbal orders for HHPT 3 Wk 4. The number to contact Crystal is [phone]

## 2019-07-09 ENCOUNTER — Ambulatory Visit (INDEPENDENT_AMBULATORY_CARE_PROVIDER_SITE_OTHER): Payer: Medicare PPO

## 2019-07-09 ENCOUNTER — Ambulatory Visit (INDEPENDENT_AMBULATORY_CARE_PROVIDER_SITE_OTHER): Payer: Medicare PPO | Admitting: Orthopedic Surgery

## 2019-07-09 ENCOUNTER — Other Ambulatory Visit: Payer: Self-pay

## 2019-07-09 ENCOUNTER — Encounter: Payer: Self-pay | Admitting: Physician Assistant

## 2019-07-09 VITALS — Ht 59.0 in | Wt 115.0 lb

## 2019-07-09 DIAGNOSIS — S72335D Nondisplaced oblique fracture of shaft of left femur, subsequent encounter for closed fracture with routine healing: Secondary | ICD-10-CM

## 2019-07-09 DIAGNOSIS — M25552 Pain in left hip: Secondary | ICD-10-CM

## 2019-07-09 DIAGNOSIS — M25559 Pain in unspecified hip: Secondary | ICD-10-CM

## 2019-07-12 ENCOUNTER — Encounter: Payer: Self-pay | Admitting: Orthopedic Surgery

## 2019-07-12 NOTE — Progress Notes (Signed)
Office Visit Note   Patient: Julia Mora           Date of Birth: Nov 08, 1950           MRN: [id number] Visit Date: 07/09/2019              Requested by: Aida Puffer, MD [address]  [place] [address] PCP: Aida Puffer, MD  Chief Complaint  Patient presents with  . Left Hip - Routine Post Op    06/02/19 IM nail left femur fx       HPI: Patient is a 69 year old woman who presents approximately 5 weeks status post intramedullary nailing left femoral shaft fracture she is currently ambulating with a quad cane.  Patient has been on bisphosphonates in the past and is considering a new bone building medication.  Assessment & Plan: Visit Diagnoses:  1. Hip pain   2. Closed nondisplaced oblique fracture of shaft of left femur with routine healing, subsequent encounter     Plan: Reviewed patient's options regarding medication or weightbearing exercises such as hopping to improve her bone mineral density.  Discussed that the most important part of improving bone density is resistive weightbearing exercises such as hopping.  Follow-Up Instructions: Return if symptoms worsen or fail to improve.   Ortho Exam  Patient is alert, oriented, no adenopathy, well-dressed, normal affect, normal respiratory effort. Examination patient ambulates well with a quad cane without pain.  Radiograph shows good callus formation no complicating features.  Imaging: No results found. No images are attached to the encounter.  Labs: No results found for: HGBA1C, ESRSEDRATE, CRP, LABURIC, REPTSTATUS, GRAMSTAIN, CULT, LABORGA   Lab Results  Component Value Date   ALBUMIN 4.0 06/01/2019   ALBUMIN 3.5 07/18/2007    Lab Results  Component Value Date   MG 3.4 (H) 07/19/2007   MG 3.7 (H) 07/18/2007   MG 3.9 (H) 07/18/2007   Lab Results  Component Value Date   VD25OH 44.71 06/01/2019    No results found for: PREALBUMIN CBC EXTENDED Latest Ref Rng & Units 06/06/2019 06/04/2019 06/04/2019    WBC 4.0 - 10.5 K/uL 5.0 - 5.8  RBC 3.87 - 5.11 MIL/uL 2.89(L) - 2.51(L)  HGB 12.0 - 15.0 g/dL 8.2(L) 8.7(L) 7.1(L)  HCT 36 - 46 % 25.7(L) 26.6(L) 22.3(L)  PLT 150 - 400 K/uL 257 - 214  NEUTROABS 1.7 - 7.7 K/uL - - 4.3  LYMPHSABS 0.7 - 4.0 K/uL - - 0.8     Body mass index is 23.23 kg/m.  Orders:  Orders Placed This Encounter  Procedures  . XR FEMUR MIN 2 VIEWS LEFT   No orders of the defined types were placed in this encounter.    Procedures: No procedures performed  Clinical Data: No additional findings.  ROS:  All other systems negative, except as noted in the HPI. Review of Systems  Objective: Vital Signs: Ht 4\' 11"  (1.499 m)   Wt 115 lb (52.2 kg)   BMI 23.23 kg/m   Specialty Comments:  No specialty comments available.  PMFS History: Patient Active Problem List   Diagnosis Date Noted  . Fall   . Closed left femoral fracture ([facility]) 06/01/2019  . Essential hypertension 06/01/2019  . CKD (chronic kidney disease) stage 3, GFR 30-59 ml/min 06/01/2019  . Left knee injury 08/21/2012  . Right foot pain 07/24/2012   Past Medical History:  Diagnosis Date  . CKD (chronic kidney disease) stage 3, GFR 30-59 ml/min   . Hypertension   .  Osteoporosis     Family History  Problem Relation Age of Onset  . Heart attack Father   . Diabetes Neg Hx   . Hyperlipidemia Neg Hx   . Hypertension Neg Hx     Past Surgical History:  Procedure Laterality Date  . ABDOMINAL HYSTERECTOMY    . ANUS SURGERY    . BLADDER SURGERY    . FEMUR IM NAIL Left 06/02/2019   Procedure: INTRAMEDULLARY (IM) NAIL FEMORAL;  Surgeon: Nadara Mustard, MD;  Location: [facility] OR;  Service: Orthopedics;  Laterality: Left;   Social History   Occupational History  . Not on file  Tobacco Use  . Smoking status: Never Smoker  . Smokeless tobacco: Never Used  Vaping Use  . Vaping Use: Never used  Substance and Sexual Activity  . Alcohol use: No  . Drug use: No  . Sexual activity: Not on file

## 2019-07-19 ENCOUNTER — Telehealth: Payer: Self-pay

## 2019-07-19 NOTE — Telephone Encounter (Signed)
Julia Mora PT with [facility] called to advise you they are discharging patient from HHPT due to patient meeting all of her goals.

## 2019-10-17 ENCOUNTER — Telehealth: Payer: Self-pay

## 2019-10-17 NOTE — Telephone Encounter (Signed)
brianna from [facility] called she stated the patient had surgery in the last 6 months and is making sure she doesn't need a pre med. Call back:[phone]

## 2019-10-29 ENCOUNTER — Telehealth: Payer: Self-pay

## 2019-10-29 NOTE — Telephone Encounter (Signed)
Do you do pre-med?

## 2019-10-29 NOTE — Telephone Encounter (Signed)
LMOM for patient of the below message  

## 2019-10-29 NOTE — Telephone Encounter (Signed)
She does not need premedication for routine procedures, such as teeth cleaning or colonoscopy

## 2019-10-29 NOTE — Telephone Encounter (Signed)
Patient called in wanting to ask questions about doing pre med before she gets her teeth cleaned

## 2019-11-19 ENCOUNTER — Other Ambulatory Visit: Payer: Self-pay

## 2019-11-19 ENCOUNTER — Telehealth: Payer: Self-pay | Admitting: Orthopedic Surgery

## 2019-11-19 NOTE — Telephone Encounter (Signed)
Patient called requesting to pick up a letter for her dentist appt stating she does need pre meds before her dental cleaning. Patient is asking if she can pick up the letter today after lunch. Please call her as soon as possible for this request. Her appt is Monday 11/25/19. Patient phone number is [phone].

## 2019-11-19 NOTE — Telephone Encounter (Signed)
I called and lm on vm to advise that dr. Lajoyce Corners does not require any premed abx converge prior to dental cleaning. She had an IM nail not a joint replacement even if premed was required for Dr. Lajoyce Corners it would not be for the surgery that she had. To call with any questions.

## 2019-11-21 ENCOUNTER — Other Ambulatory Visit: Payer: Self-pay

## 2020-08-06 ENCOUNTER — Ambulatory Visit: Payer: Medicare PPO | Admitting: Orthopedic Surgery

## 2020-08-06 ENCOUNTER — Encounter: Payer: Self-pay | Admitting: Orthopedic Surgery

## 2020-08-06 ENCOUNTER — Other Ambulatory Visit: Payer: Self-pay

## 2020-08-06 DIAGNOSIS — M545 Low back pain, unspecified: Secondary | ICD-10-CM

## 2020-08-06 DIAGNOSIS — M79605 Pain in left leg: Secondary | ICD-10-CM

## 2020-08-06 MED ORDER — PREDNISONE 10 MG PO TABS: 10.0000 mg | ORAL_TABLET | Freq: Every day | ORAL | 0 refills | Status: DC

## 2020-08-06 NOTE — Progress Notes (Signed)
Office Visit Note   Patient: Julia Mora           Date of Birth: 1950/06/30           MRN: [id number] Visit Date: 08/06/2020              Requested by: Aida Puffer, MD [address]  [place] [address] PCP: Aida Puffer, MD  Chief Complaint  Patient presents with   Left Knee - Pain      HPI: Patient is a 70 year old woman who presents with complaints of lateral left thigh pain.  This radiates down to her knee.  She states she did have a steroid injection in her knee that helped for about 10 days.  She states she does have start up stiffness.  She states the lateral thigh pain is worse with sitting and driving.  She is status post intramedullary nailing for midshaft femur fracture.  Assessment & Plan: Visit Diagnoses:  1. Low back pain radiating to left leg     Plan: We will start her on a low-dose of prednisone and have her follow-up before she leaves on a cross-country trip.  Patient's primary symptoms at this time seem to be coming from the sciatic nerve her intra-articular symptoms seem to be less of a concern.  Patient may require an MRI scan of her lumbar spine.  Follow-Up Instructions: Return in about 3 weeks (around 08/27/2020).   Ortho Exam  Patient is alert, oriented, no adenopathy, well-dressed, normal affect, normal respiratory effort. Examination the patient has a normal gait she has a negative straight leg raise on the left.  She has no focal motor weakness her pain has distally over the hamstrings and laterally over the iliotibial band.  The medial and lateral joint lines are nontender to palpation collaterals officiates are stable.  Review of the MRI scan shows arthritic changes of the left knee as well as a degenerative tearing of the medial and lateral meniscus.  Imaging: No results found. No images are attached to the encounter.  Labs: No results found for: HGBA1C, ESRSEDRATE, CRP, LABURIC, REPTSTATUS, GRAMSTAIN, CULT, LABORGA   Lab Results   Component Value Date   ALBUMIN 4.0 06/01/2019   ALBUMIN 3.5 07/18/2007    Lab Results  Component Value Date   MG 3.4 (H) 07/19/2007   MG 3.7 (H) 07/18/2007   MG 3.9 (H) 07/18/2007   Lab Results  Component Value Date   VD25OH 44.71 06/01/2019    No results found for: PREALBUMIN CBC EXTENDED Latest Ref Rng & Units 06/06/2019 06/04/2019 06/04/2019  WBC 4.0 - 10.5 K/uL 5.0 - 5.8  RBC 3.87 - 5.11 MIL/uL 2.89(L) - 2.51(L)  HGB 12.0 - 15.0 g/dL 8.2(L) 8.7(L) 7.1(L)  HCT 36.0 - 46.0 % 25.7(L) 26.6(L) 22.3(L)  PLT 150 - 400 K/uL 257 - 214  NEUTROABS 1.7 - 7.7 K/uL - - 4.3  LYMPHSABS 0.7 - 4.0 K/uL - - 0.8     There is no height or weight on file to calculate BMI.  Orders:  No orders of the defined types were placed in this encounter.  No orders of the defined types were placed in this encounter.    Procedures: No procedures performed  Clinical Data: No additional findings.  ROS:  All other systems negative, except as noted in the HPI. Review of Systems  Objective: Vital Signs: There were no vitals taken for this visit.  Specialty Comments:  No specialty comments available.  PMFS History:  Patient Active Problem List   Diagnosis Date Noted   Fall    Closed left femoral fracture (HCC) 06/01/2019   Essential hypertension 06/01/2019   CKD (chronic kidney disease) stage 3, GFR 30-59 ml/min (HCC) 06/01/2019   Left knee injury 08/21/2012   Right foot pain 07/24/2012   Past Medical History:  Diagnosis Date   CKD (chronic kidney disease) stage 3, GFR 30-59 ml/min (HCC)    Hypertension    Osteoporosis     Family History  Problem Relation Age of Onset   Heart attack Father    Diabetes Neg Hx    Hyperlipidemia Neg Hx    Hypertension Neg Hx     Past Surgical History:  Procedure Laterality Date   ABDOMINAL HYSTERECTOMY     ANUS SURGERY     BLADDER SURGERY     FEMUR IM NAIL Left 06/02/2019   Procedure: INTRAMEDULLARY (IM) NAIL FEMORAL;  Surgeon: Nadara Mustard, MD;   Location: [facility] OR;  Service: Orthopedics;  Laterality: Left;   Social History   Occupational History   Not on file  Tobacco Use   Smoking status: Never   Smokeless tobacco: Never  Vaping Use   Vaping Use: Never used  Substance and Sexual Activity   Alcohol use: No   Drug use: No   Sexual activity: Not on file

## 2020-09-03 ENCOUNTER — Ambulatory Visit: Payer: Medicare PPO | Admitting: Orthopedic Surgery

## 2020-09-03 ENCOUNTER — Other Ambulatory Visit: Payer: Self-pay

## 2020-09-03 DIAGNOSIS — M79605 Pain in left leg: Secondary | ICD-10-CM | POA: Diagnosis not present

## 2020-09-03 DIAGNOSIS — M545 Low back pain, unspecified: Secondary | ICD-10-CM | POA: Diagnosis not present

## 2020-09-08 ENCOUNTER — Encounter: Payer: Self-pay | Admitting: Orthopedic Surgery

## 2020-09-08 NOTE — Progress Notes (Signed)
Office Visit Note   Patient: Julia Mora           Date of Birth: 1950/08/29           MRN: [id number] Visit Date: 09/03/2020              Requested by: Aida Puffer, MD [address]  [place] [address] PCP: Aida Puffer, MD  Chief Complaint  Patient presents with   Lower Back - Follow-up      HPI: Patient is a 70 year old woman who presents with persistent lower back pain radiating to the left lower extremity she has taken prednisone 10 mg for a month without relief.  Patient states the pain radiates to the popliteal fossa as well as lateral aspect of her left leg.  She states she has had symptoms for 2 years after a fall.  She has had an MRI scan of the left knee that was normal.  Assessment & Plan: Visit Diagnoses:  1. Low back pain radiating to left leg     Plan: Will request an MRI scan of her lumbar spine with and anticipation of proceeding with an epidural steroid injection.  Follow-Up Instructions: Return in about 2 weeks (around 09/17/2020) for Follow-up after MRI scan lumbar spine.   Ortho Exam  Patient is alert, oriented, no adenopathy, well-dressed, normal affect, normal respiratory effort. Examination patient has a negative straight leg raise bilaterally no pain with range of motion of the hip knee or ankle.  There is no focal motor weakness.  Imaging: No results found. No images are attached to the encounter.  Labs: No results found for: HGBA1C, ESRSEDRATE, CRP, LABURIC, REPTSTATUS, GRAMSTAIN, CULT, LABORGA   Lab Results  Component Value Date   ALBUMIN 4.0 06/01/2019   ALBUMIN 3.5 07/18/2007    Lab Results  Component Value Date   MG 3.4 (H) 07/19/2007   MG 3.7 (H) 07/18/2007   MG 3.9 (H) 07/18/2007   Lab Results  Component Value Date   VD25OH 44.71 06/01/2019    No results found for: PREALBUMIN CBC EXTENDED Latest Ref Rng & Units 06/06/2019 06/04/2019 06/04/2019  WBC 4.0 - 10.5 K/uL 5.0 - 5.8  RBC 3.87 - 5.11 MIL/uL 2.89(L) - 2.51(L)   HGB 12.0 - 15.0 g/dL 8.2(L) 8.7(L) 7.1(L)  HCT 36.0 - 46.0 % 25.7(L) 26.6(L) 22.3(L)  PLT 150 - 400 K/uL 257 - 214  NEUTROABS 1.7 - 7.7 K/uL - - 4.3  LYMPHSABS 0.7 - 4.0 K/uL - - 0.8     There is no height or weight on file to calculate BMI.  Orders:  No orders of the defined types were placed in this encounter.  No orders of the defined types were placed in this encounter.    Procedures: No procedures performed  Clinical Data: No additional findings.  ROS:  All other systems negative, except as noted in the HPI. Review of Systems  Objective: Vital Signs: There were no vitals taken for this visit.  Specialty Comments:  No specialty comments available.  PMFS History: Patient Active Problem List   Diagnosis Date Noted   Fall    Closed left femoral fracture (HCC) 06/01/2019   Essential hypertension 06/01/2019   CKD (chronic kidney disease) stage 3, GFR 30-59 ml/min (HCC) 06/01/2019   Left knee injury 08/21/2012   Right foot pain 07/24/2012   Past Medical History:  Diagnosis Date   CKD (chronic kidney disease) stage 3, GFR 30-59 ml/min (HCC)    Hypertension  Osteoporosis     Family History  Problem Relation Age of Onset   Heart attack Father    Diabetes Neg Hx    Hyperlipidemia Neg Hx    Hypertension Neg Hx     Past Surgical History:  Procedure Laterality Date   ABDOMINAL HYSTERECTOMY     ANUS SURGERY     BLADDER SURGERY     FEMUR IM NAIL Left 06/02/2019   Procedure: INTRAMEDULLARY (IM) NAIL FEMORAL;  Surgeon: Nadara Mustard, MD;  Location: [facility] OR;  Service: Orthopedics;  Laterality: Left;   Social History   Occupational History   Not on file  Tobacco Use   Smoking status: Never   Smokeless tobacco: Never  Vaping Use   Vaping Use: Never used  Substance and Sexual Activity   Alcohol use: No   Drug use: No   Sexual activity: Not on file

## 2020-10-28 ENCOUNTER — Telehealth: Payer: Self-pay

## 2020-10-28 ENCOUNTER — Other Ambulatory Visit: Payer: Self-pay

## 2020-10-28 DIAGNOSIS — M545 Low back pain, unspecified: Secondary | ICD-10-CM

## 2020-10-28 NOTE — Telephone Encounter (Signed)
She is actually not scheduled until 11/14/20, but I think that is due to the patient's schedule.

## 2020-10-28 NOTE — Telephone Encounter (Signed)
This pt was in the office 09/03/20 and Dr.Duda dictated that he wanted to pt to have an MRI but there was not an order for one in her chart. Pt called today to check the status. Is there any way that we can quickly get this pt sch for an MRI and then follow up with FN for possible ESI consideration?

## 2020-10-28 NOTE — Telephone Encounter (Signed)
I really appreciate your help! Thank you!

## 2020-10-28 NOTE — Telephone Encounter (Signed)
I have gotten this authorized and sent order to [facility], which should be able to get her in this weekend.

## 2020-10-28 NOTE — Telephone Encounter (Signed)
Pt called in stating she never received a call to schedule a MRI.  I don't see in her chart where a Mri was ever ordered ?  Please advise

## 2020-11-14 ENCOUNTER — Ambulatory Visit (HOSPITAL_BASED_OUTPATIENT_CLINIC_OR_DEPARTMENT_OTHER)
Admission: RE | Admit: 2020-11-14 | Discharge: 2020-11-14 | Disposition: A | Discharge status: Home / Self Care | Payer: Medicare PPO | Source: Ambulatory Visit | Attending: Orthopedic Surgery | Admitting: Orthopedic Surgery

## 2020-11-14 ENCOUNTER — Other Ambulatory Visit: Payer: Self-pay

## 2020-11-14 DIAGNOSIS — M79605 Pain in left leg: Secondary | ICD-10-CM | POA: Diagnosis present

## 2020-11-14 DIAGNOSIS — M545 Low back pain, unspecified: Secondary | ICD-10-CM | POA: Insufficient documentation

## 2020-11-17 ENCOUNTER — Other Ambulatory Visit: Payer: Self-pay

## 2020-11-17 ENCOUNTER — Telehealth: Payer: Self-pay

## 2020-11-17 DIAGNOSIS — M545 Low back pain, unspecified: Secondary | ICD-10-CM

## 2020-11-17 DIAGNOSIS — M79605 Pain in left leg: Secondary | ICD-10-CM

## 2020-11-17 NOTE — Telephone Encounter (Signed)
Called pt to advise of results and order placed for [facility] for ESI injections. Pt will call with any there questions.

## 2020-11-17 NOTE — Telephone Encounter (Signed)
-----   Message from Nadara Mustard, MD sent at 11/16/2020 12:05 PM EST ----- Call patient.  Lets have her follow-up with Dr. Alvester Morin to evaluate for possible epidural steroid injection.  Her MRI scan does show arthritic changes throughout her back no focal herniated disks or stenosis. ----- Message ----- From: Interface, Rad Results In Sent: 11/15/2020   8:20 AM EST To: Nadara Mustard, MD

## 2020-11-30 ENCOUNTER — Ambulatory Visit: Payer: Medicare PPO | Admitting: Orthopedic Surgery

## 2020-12-07 ENCOUNTER — Other Ambulatory Visit: Payer: Self-pay

## 2020-12-07 ENCOUNTER — Encounter: Payer: Self-pay | Admitting: Physical Medicine and Rehabilitation

## 2020-12-07 ENCOUNTER — Ambulatory Visit: Payer: Medicare PPO | Admitting: Physical Medicine and Rehabilitation

## 2020-12-07 DIAGNOSIS — M5116 Intervertebral disc disorders with radiculopathy, lumbar region: Secondary | ICD-10-CM

## 2020-12-07 DIAGNOSIS — M47816 Spondylosis without myelopathy or radiculopathy, lumbar region: Secondary | ICD-10-CM | POA: Diagnosis not present

## 2020-12-07 DIAGNOSIS — M5416 Radiculopathy, lumbar region: Secondary | ICD-10-CM

## 2020-12-07 NOTE — Progress Notes (Signed)
[name] - 70 y.o. female MRN [id number]  Date of birth: [birth year]  Office Visit Note: Visit Date: 12/07/2020 PCP: Loraine Leriche., MD Referred by: Loraine Leriche.,*  Subjective: Chief Complaint  Patient presents with   Lower Back - Pain   HPI: Julia Mora is a 70 y.o. female who comes in today per the request of Dr. Meridee Score for evaluation of left sided lower back pain to buttock and down left leg. Patient reports pain has been ongoing for several years. Patient reports pain is exacerbated by movement and activity, describes pain as a soreness sensation, currently rates as 8 out of 10. Patient reports some relief of pain with home exercise program, rest and use of medications such as gabapentin and tylenol. Patient's recent lumbar MRI exhibits multi-level facet hypertrophy and mild broad-based disc bulge with a prominent left lateral disc osteophyte complex. No high grade spinal canal stenosis noted. Patient was previously treated by Dr. Lynden Oxford at [facility] where she had facet joint injections and 2 left sacroiliac joint injections with no relief of pain. Patient states her pain has become significantly worse over the last several months and is not having difficulty performing daily tasks. Patient states her leg pain slowly progresses throughout the day and becomes most severe at night when she tries to go to sleep. Patient has attended formal physical therapy in the past for balance issues at [place], however she has not be evaluated for her chronic back issues. Patient sustained displaced left femur fracture from mechanical fall in 2021. Patient denies focal weakness, numbness and tingling. Patient denies recent trauma or falls.    26% Oswestry Disability Index Score: 10 to 20 (40%) moderate disability: The patient experiences more pain and difficulty with sitting, lifting and standing. Travel and social life are more  difficult, and they may be disabled from work. Personal care, sexual activity and sleeping are not grossly affected, and the patient can usually be managed by conservative means.  Review of Systems  Musculoskeletal:  Positive for back pain.  Neurological:  Negative for tingling, sensory change, focal weakness and weakness.  All other systems reviewed and are negative. Otherwise per HPI.  Assessment & Plan: Visit Diagnoses:    ICD-10-CM   1. Lumbar radiculopathy  M54.16 Ambulatory referral to Physical Medicine Rehab    2. Intervertebral disc disorders with radiculopathy, lumbar region  M51.16     3. Facet hypertrophy of lumbar region  M47.816        Plan: Findings:  Chronic, worsening and severe left sided lower back pain radiating to buttock and down leg. Patient voices that the left leg pain is very severe and keeping her from performing daily activities. Patient continues to have excruciating and debilitating pain despite good conservative therapies such as home exercise program, rest and use of medications. Patient's clinical presentation and exam are consistent with L5 nerve pattern. We believe the next step is to perform a diagnostic and hopefully therapeutic left L5-S1 interlaminar epidural steroid injection under fluoroscopic guidance. Patient is not currently on long term anticoagulant therapy. If patient does well with epidural injection we will continue to monitor, however if her pain persists we would consider medication management and possibly re-grouping with physical therapy. No red flag symptoms noted upon exam today.    Meds & Orders: No orders of the defined types were placed in this encounter.   Orders Placed This Encounter  Procedures   Ambulatory  referral to Physical Medicine Rehab    Follow-up: Return for Left L5-S1 interlaminar epidural steroid injection.   Procedures: No procedures performed      Clinical History: MRI LUMBAR SPINE WITHOUT CONTRAST    TECHNIQUE: Multiplanar, multisequence MR imaging of the lumbar spine was performed. No intravenous contrast was administered.   COMPARISON:  None.   FINDINGS: Segmentation:  Standard.   Alignment: Levoscoliosis of the thoracolumbar spine. 2 mm retrolisthesis of L2 on L3. Minimal grade 1 anterolisthesis of L4 on L5 and L5 on S1 secondary to facet disease.   Vertebrae: No acute fracture, evidence of discitis, or aggressive bone lesion.   Conus medullaris and cauda equina: Conus extends to the T12-L1 level. Conus and cauda equina appear normal.   Paraspinal and other soft tissues: No acute paraspinal abnormality.   Disc levels:   Disc spaces: Degenerative disease with disc height loss at L2-3 and L3-4. Mild disc height loss at L5-S1.   T12-L1: No disc protrusion, foraminal stenosis or central canal stenosis.   L1-L2: Mild broad-based disc bulge. No foraminal or central canal stenosis.   L2-L3: Mild broad-based disc bulge eccentric towards the right. Mild bilateral facet arthropathy. Right lateral recess stenosis. No foraminal or central canal stenosis.   L3-L4: Broad-based disc bulge. No foraminal or central canal stenosis.   L4-L5: Broad-based disc bulge. Mild bilateral facet arthropathy with right facet effusion. No foraminal or central canal stenosis.   L5-S1: Mild broad-based disc bulge with a prominent left lateral disc osteophyte complex. Mild left facet arthropathy. No foraminal or central canal stenosis.   IMPRESSION: 1. Lumbar spine spondylosis as described above. 2. No acute osseous injury of the lumbar spine.     Electronically Signed   By: Kathreen Devoid M.D.   On: 11/15/2020 08:18   She reports that she has never smoked. She has never used smokeless tobacco. No results for input(s): HGBA1C, LABURIC in the last 8760 hours.  Objective:  VS:  HT:    WT:   BMI:     BP:   HR: bpm  TEMP: ( )  RESP:  Physical Exam Vitals and nursing note reviewed.   HENT:     Head: Normocephalic and atraumatic.     Right Ear: External ear normal.     Left Ear: External ear normal.     Nose: Nose normal.     Mouth/Throat:     Mouth: Mucous membranes are moist.  Eyes:     Extraocular Movements: Extraocular movements intact.  Cardiovascular:     Rate and Rhythm: Normal rate.     Pulses: Normal pulses.  Pulmonary:     Effort: Pulmonary effort is normal.  Abdominal:     General: Abdomen is flat. There is no distension.  Musculoskeletal:        General: Tenderness present.     Cervical back: Tenderness present.     Comments: Pt rises from seated position to standing without difficulty. Good lumbar range of motion. Strong distal strength without clonus, no pain upon palpation of greater trochanters. Sensation intact bilaterally. Dysesthesias noted to L5 dermatome on the left. Walks independently, gait steady. Positive slump test.    Skin:    General: Skin is warm and dry.     Capillary Refill: Capillary refill takes less than 2 seconds.  Neurological:     General: No focal deficit present.     Mental Status: She is alert and oriented to person, place, and time.  Psychiatric:  Mood and Affect: Mood normal.    Ortho Exam  Imaging: No results found.  Past Medical/Family/Surgical/Social History: Medications & Allergies reviewed per EMR, new medications updated. Patient Active Problem List   Diagnosis Date Noted   Fall    Closed left femoral fracture (HCC) 06/01/2019   Essential hypertension 06/01/2019   CKD (chronic kidney disease) stage 3, GFR 30-59 ml/min (HCC) 06/01/2019   Left knee injury 08/21/2012   Right foot pain 07/24/2012   Past Medical History:  Diagnosis Date   CKD (chronic kidney disease) stage 3, GFR 30-59 ml/min (HCC)    Hypertension    Osteoporosis    Family History  Problem Relation Age of Onset   Heart attack Father    Diabetes Neg Hx    Hyperlipidemia Neg Hx    Hypertension Neg Hx    Past Surgical  History:  Procedure Laterality Date   ABDOMINAL HYSTERECTOMY     ANUS SURGERY     BLADDER SURGERY     FEMUR IM NAIL Left 06/02/2019   Procedure: INTRAMEDULLARY (IM) NAIL FEMORAL;  Surgeon: Nadara Mustard, MD;  Location: [facility] OR;  Service: Orthopedics;  Laterality: Left;   Social History   Occupational History   Not on file  Tobacco Use   Smoking status: Never   Smokeless tobacco: Never  Vaping Use   Vaping Use: Never used  Substance and Sexual Activity   Alcohol use: No   Drug use: No   Sexual activity: Not on file

## 2020-12-07 NOTE — Progress Notes (Signed)
Numeric Pain Rating Scale and Functional Assessment Average Pain 1   In the last MONTH (on 0-10 scale) has pain interfered with the following?  1. General activity like being  able to carry out your everyday physical activities such as walking, climbing stairs, carrying groceries, or moving a chair?  Rating(5)     Patient comes in for LBP. States she's been having pain for 2 years. Previous injections did not help. Having Tingling and numbness. Pain sometimes radiates down left leg. Takes Gabapentin but hasn't really noticed a difference. Takes Tylenol at night. Worse when sitting and at night time.

## 2020-12-24 ENCOUNTER — Other Ambulatory Visit: Payer: Self-pay

## 2020-12-24 ENCOUNTER — Ambulatory Visit: Payer: Medicare PPO | Admitting: Physical Medicine and Rehabilitation

## 2020-12-24 ENCOUNTER — Encounter: Payer: Self-pay | Admitting: Physical Medicine and Rehabilitation

## 2020-12-24 ENCOUNTER — Ambulatory Visit: Payer: Self-pay

## 2020-12-24 VITALS — BP 120/78 | HR 87

## 2020-12-24 DIAGNOSIS — M5416 Radiculopathy, lumbar region: Secondary | ICD-10-CM | POA: Diagnosis not present

## 2020-12-24 MED ORDER — METHYLPREDNISOLONE ACETATE 80 MG/ML IJ SUSP
80.0000 mg | Freq: Once | INTRAMUSCULAR | Status: AC
  Administered 2020-12-24: 80 mg

## 2020-12-24 NOTE — Patient Instructions (Signed)

## 2020-12-24 NOTE — Progress Notes (Signed)
Pt state lower back pain that travels down her left thigh and leg. Pt state sitting makes the pain worse. Pt state she takes over the counter pain meds to help ease her pain.  Numeric Pain Rating Scale and Functional Assessment Average Pain 3   In the last MONTH (on 0-10 scale) has pain interfered with the following?  1. General activity like being  able to carry out your everyday physical activities such as walking, climbing stairs, carrying groceries, or moving a chair?  Rating(10)   +Driver, -BT, -Dye Allergies.

## 2021-01-05 ENCOUNTER — Telehealth: Payer: Self-pay | Admitting: Physical Medicine and Rehabilitation

## 2021-01-05 NOTE — Telephone Encounter (Signed)
Pt called stating she got an epidural inj and was told to call back to let him know how it worked. Pt states she got no relief except for 1 day and then the pain came back; she would like a CB to be advised what Dr.Newton wants her to do next.   [phone]

## 2021-01-06 ENCOUNTER — Ambulatory Visit (INDEPENDENT_AMBULATORY_CARE_PROVIDER_SITE_OTHER): Payer: Medicare Other | Admitting: Physical Medicine and Rehabilitation

## 2021-01-06 ENCOUNTER — Encounter: Payer: Self-pay | Admitting: Physical Medicine and Rehabilitation

## 2021-01-06 ENCOUNTER — Other Ambulatory Visit: Payer: Self-pay

## 2021-01-06 VITALS — BP 150/86 | HR 73

## 2021-01-06 DIAGNOSIS — G8929 Other chronic pain: Secondary | ICD-10-CM | POA: Diagnosis not present

## 2021-01-06 DIAGNOSIS — M47816 Spondylosis without myelopathy or radiculopathy, lumbar region: Secondary | ICD-10-CM

## 2021-01-06 DIAGNOSIS — M5442 Lumbago with sciatica, left side: Secondary | ICD-10-CM | POA: Diagnosis not present

## 2021-01-06 DIAGNOSIS — M5116 Intervertebral disc disorders with radiculopathy, lumbar region: Secondary | ICD-10-CM

## 2021-01-06 DIAGNOSIS — M5416 Radiculopathy, lumbar region: Secondary | ICD-10-CM

## 2021-01-06 NOTE — Progress Notes (Signed)
Pt state lower back pain that travels down the posterior left knee. Pt state sitting makes the pain worse. Pt state she takes pain meds and uses heat to help ease her pain. Pt has hx of inj on 12/24/20 pt state it helped for three days with 70% relief.  Numeric Pain Rating Scale and Functional Assessment Average Pain 10 Pain Right Now 8 My pain is intermittent, sharp, burning, dull, stabbing, tingling, and aching Pain is worse with: sitting and Laying down Pain improves with: heat/ice, medication, and injections   In the last MONTH (on 0-10 scale) has pain interfered with the following?  1. General activity like being  able to carry out your everyday physical activities such as walking, climbing stairs, carrying groceries, or moving a chair?  Rating(7)  2. Relation with others like being able to carry out your usual social activities and roles such as  activities at home, at work and in your community. Rating(8)  3. Enjoyment of life such that you have  been bothered by emotional problems such as feeling anxious, depressed or irritable?  Rating(9)

## 2021-01-06 NOTE — Progress Notes (Signed)
Julia Mora - 71 y.o. female MRN [id number]  Date of birth: 12-22-1950  Office Visit Note: Visit Date: 01/06/2021 PCP: Loraine Leriche., MD Referred by: Loraine Leriche.,*  Subjective: Chief Complaint  Patient presents with   Lower Back - Pain   Left Knee - Pain   HPI: Julia Mora is a 71 y.o. female who comes in today for evaluation of chronic, worsening and severe left sided lower back pain radiating to buttock and lateral left leg. Patient reports greater than 70% relief with recent left L5-S1 interlaminar epidural steroid injection on 12/24/2020, however pain relief only lasted approximately 3 days. Patient reports ongoing pain for several years. Patient reports pain is exacerbated by movement and activity, describes as a soreness sensation, currently rates pain as 7 out of 10. Patient reports some pain relief with home exercise program, rest and use of medications. Patient continues to take Gabapentin and Tylenol. Patient's recent lumbar MRI exhibits multi-level facet hypertrophy and mild broad-based disc bulge with a prominent left lateral disc osteophyte complex at L5-S1. No high grade spinal canal stenosis noted. Patient has attended formal physical therapy in the past for balance issues at [place], however she has not be evaluated for her chronic back issues. Patient sustained displaced left femur fracture from mechanical fall in 2021. Patient states she feels that recent lumbar epidural steroid injection helped to increase her functionality, however she wishes pain relief was more sustained. Patient states her pain is most severe a night when she lays down to sleep, she is now taking Restoril to help with sleep. Patient denies focal weakness, numbness and tingling. Patient denies recent trauma or falls.    Review of Systems  Musculoskeletal:  Positive for back pain.  Neurological:  Negative for tingling, sensory change, focal weakness and weakness.   All other systems reviewed and are negative. Otherwise per HPI.  Assessment & Plan: Visit Diagnoses:    ICD-10-CM   1. Lumbar radiculopathy  M54.16 Ambulatory referral to Physical Medicine Rehab    2. Chronic left-sided low back pain with left-sided sciatica  M54.42    G89.29     3. Intervertebral disc disorders with radiculopathy, lumbar region  M51.16     4. Facet hypertrophy of lumbar region  M47.816        Plan: Findings:  Chronic, worsening and severe left sided lower back pain radiating to buttock and down left lateral leg. Patient continues to have excruciating pain despite good conservative therapies such as home exercise program, rest and use of medications. Patient did get good pain relief from recent left L5-S1 interlaminar epidural injection, however pain relief was short lived. We discussed patient's recent lumbar MRI with her today using images and spine model, specifically left lateral disc at the level of L5-S1 which we believe could be causing L5 nerve irritation. We believe the next step is to perform diagnostic and hopefully therapeutic left L5 transforaminal epidural steroid injection under fluoroscopic guidance. If patient does well with epidural injection we will continue to monitor, if her pain persists we would consider repeat if good pain relief but short lived. Patient encouraged to remain active and to continue home exercise regimen as tolerated. No red flag symptoms noted upon exam today.    Meds & Orders: No orders of the defined types were placed in this encounter.   Orders Placed This Encounter  Procedures   Ambulatory referral to Physical Medicine Rehab    Follow-up: Return for Left L5  transforaminal epidural steroid injection.   Procedures: No procedures performed      Clinical History: MRI LUMBAR SPINE WITHOUT CONTRAST   TECHNIQUE: Multiplanar, multisequence MR imaging of the lumbar spine was performed. No intravenous contrast was administered.    COMPARISON:  None.   FINDINGS: Segmentation:  Standard.   Alignment: Levoscoliosis of the thoracolumbar spine. 2 mm retrolisthesis of L2 on L3. Minimal grade 1 anterolisthesis of L4 on L5 and L5 on S1 secondary to facet disease.   Vertebrae: No acute fracture, evidence of discitis, or aggressive bone lesion.   Conus medullaris and cauda equina: Conus extends to the T12-L1 level. Conus and cauda equina appear normal.   Paraspinal and other soft tissues: No acute paraspinal abnormality.   Disc levels:   Disc spaces: Degenerative disease with disc height loss at L2-3 and L3-4. Mild disc height loss at L5-S1.   T12-L1: No disc protrusion, foraminal stenosis or central canal stenosis.   L1-L2: Mild broad-based disc bulge. No foraminal or central canal stenosis.   L2-L3: Mild broad-based disc bulge eccentric towards the right. Mild bilateral facet arthropathy. Right lateral recess stenosis. No foraminal or central canal stenosis.   L3-L4: Broad-based disc bulge. No foraminal or central canal stenosis.   L4-L5: Broad-based disc bulge. Mild bilateral facet arthropathy with right facet effusion. No foraminal or central canal stenosis.   L5-S1: Mild broad-based disc bulge with a prominent left lateral disc osteophyte complex. Mild left facet arthropathy. No foraminal or central canal stenosis.   IMPRESSION: 1. Lumbar spine spondylosis as described above. 2. No acute osseous injury of the lumbar spine.     Electronically Signed   By: Kathreen Devoid M.D.   On: 11/15/2020 08:18   She reports that she has never smoked. She has never used smokeless tobacco. No results for input(s): HGBA1C, LABURIC in the last 8760 hours.  Objective:  VS:  HT:     WT:    BMI:      BP:(!) 150/86   HR:73bpm   TEMP: ( )   RESP:  Physical Exam Vitals and nursing note reviewed.  HENT:     Head: Normocephalic and atraumatic.     Right Ear: External ear normal.     Left Ear: External ear normal.      Nose: Nose normal.     Mouth/Throat:     Mouth: Mucous membranes are moist.  Eyes:     Extraocular Movements: Extraocular movements intact.  Cardiovascular:     Rate and Rhythm: Normal rate.     Pulses: Normal pulses.  Pulmonary:     Effort: Pulmonary effort is normal.  Abdominal:     General: Abdomen is flat. There is no distension.  Musculoskeletal:        General: Tenderness present.     Cervical back: Normal range of motion.     Comments: Pt rises from seated position to standing without difficulty. Good lumbar range of motion. Strong distal strength without clonus, no pain upon palpation of greater trochanters. Sensation intact bilaterally. Dysesthesias noted to left L5 dermatome. Walks independently, gait steady.  Skin:    General: Skin is warm and dry.     Capillary Refill: Capillary refill takes less than 2 seconds.  Neurological:     General: No focal deficit present.     Mental Status: She is alert and oriented to person, place, and time.  Psychiatric:        Mood and Affect: Mood normal.    Ortho  Exam  Imaging: No results found.  Past Medical/Family/Surgical/Social History: Medications & Allergies reviewed per EMR, new medications updated. Patient Active Problem List   Diagnosis Date Noted   Fall    Closed left femoral fracture ([place]) 06/01/2019   Essential hypertension 06/01/2019   CKD (chronic kidney disease) stage 3, GFR 30-59 ml/min (HCC) 06/01/2019   Left knee injury 08/21/2012   Right foot pain 07/24/2012   Past Medical History:  Diagnosis Date   CKD (chronic kidney disease) stage 3, GFR 30-59 ml/min (HCC)    Hypertension    Osteoporosis    Family History  Problem Relation Age of Onset   Heart attack Father    Diabetes Neg Hx    Hyperlipidemia Neg Hx    Hypertension Neg Hx    Past Surgical History:  Procedure Laterality Date   ABDOMINAL HYSTERECTOMY     ANUS SURGERY     BLADDER SURGERY     FEMUR IM NAIL Left 06/02/2019   Procedure:  INTRAMEDULLARY (IM) NAIL FEMORAL;  Surgeon: Newt Minion, MD;  Location: [place];  Service: Orthopedics;  Laterality: Left;   Social History   Occupational History   Not on file  Tobacco Use   Smoking status: Never   Smokeless tobacco: Never  Vaping Use   Vaping Use: Never used  Substance and Sexual Activity   Alcohol use: No   Drug use: No   Sexual activity: Not on file

## 2021-01-07 NOTE — Progress Notes (Signed)
Julia Mora - 71 y.o. female MRN [id number]  Date of birth: 1950/06/07  Office Visit Note: Visit Date: 12/24/2020 PCP: Loraine Leriche., MD Referred by: Lorine Bears, NP  Subjective: Chief Complaint  Patient presents with   Lower Back - Pain   Left Thigh - Pain   Left Leg - Pain   HPI:  Julia Mora is a 71 y.o. female who comes in today at the request of Barnet Pall, FNP for planned Left L5-S1 Lumbar Interlaminar epidural steroid injection with fluoroscopic guidance.  The patient has failed conservative care including home exercise, medications, time and activity modification.  This injection will be diagnostic and hopefully therapeutic.  Please see requesting physician notes for further details and justification.   ROS Otherwise per HPI.  Assessment & Plan: Visit Diagnoses:    ICD-10-CM   1. Lumbar radiculopathy  M54.16 XR C-ARM NO REPORT    Epidural Steroid injection    methylPREDNISolone acetate (DEPO-MEDROL) injection 80 mg      Plan: No additional findings.   Meds & Orders:  Meds ordered this encounter  Medications   methylPREDNISolone acetate (DEPO-MEDROL) injection 80 mg    Orders Placed This Encounter  Procedures   XR C-ARM NO REPORT   Epidural Steroid injection    Follow-up: Return for visit to requesting provider as needed.   Procedures: No procedures performed  Lumbar Epidural Steroid Injection - Interlaminar Approach with Fluoroscopic Guidance  Patient: Julia Mora      Date of Birth: Jan 04, 1951 MRN: [id number] PCP: Loraine Leriche., MD      Visit Date: 12/24/2020   Universal Protocol:     Consent Given By: the patient  Position: PRONE  Additional Comments: Vital signs were monitored before and after the procedure. Patient was prepped and draped in the usual sterile fashion. The correct patient, procedure, and site was verified.   Injection Procedure Details:   Procedure diagnoses: Lumbar radiculopathy  [M54.16]   Meds Administered:  Meds ordered this encounter  Medications   methylPREDNISolone acetate (DEPO-MEDROL) injection 80 mg     Laterality: Left  Location/Site:  L5-S1  Needle: 3.5 in., 20 ga. Tuohy  Needle Placement: Paramedian epidural  Findings:   -Comments: Excellent flow of contrast into the epidural space.  Procedure Details: Using a paramedian approach from the side mentioned above, the region overlying the inferior lamina was localized under fluoroscopic visualization and the soft tissues overlying this structure were infiltrated with 4 ml. of 1% Lidocaine without Epinephrine. The Tuohy needle was inserted into the epidural space using a paramedian approach.   The epidural space was localized using loss of resistance along with counter oblique bi-planar fluoroscopic views.  After negative aspirate for air, blood, and CSF, a 2 ml. volume of Isovue-250 was injected into the epidural space and the flow of contrast was observed. Radiographs were obtained for documentation purposes.    The injectate was administered into the level noted above.   Additional Comments:  The patient tolerated the procedure well Dressing: 2 x 2 sterile gauze and Band-Aid    Post-procedure details: Patient was observed during the procedure. Post-procedure instructions were reviewed.  Patient left the clinic in stable condition.   Clinical History: MRI LUMBAR SPINE WITHOUT CONTRAST   TECHNIQUE: Multiplanar, multisequence MR imaging of the lumbar spine was performed. No intravenous contrast was administered.   COMPARISON:  None.   FINDINGS: Segmentation:  Standard.   Alignment: Levoscoliosis of the thoracolumbar spine. 2 mm retrolisthesis of  L2 on L3. Minimal grade 1 anterolisthesis of L4 on L5 and L5 on S1 secondary to facet disease.   Vertebrae: No acute fracture, evidence of discitis, or aggressive bone lesion.   Conus medullaris and cauda equina: Conus extends to the  T12-L1 level. Conus and cauda equina appear normal.   Paraspinal and other soft tissues: No acute paraspinal abnormality.   Disc levels:   Disc spaces: Degenerative disease with disc height loss at L2-3 and L3-4. Mild disc height loss at L5-S1.   T12-L1: No disc protrusion, foraminal stenosis or central canal stenosis.   L1-L2: Mild broad-based disc bulge. No foraminal or central canal stenosis.   L2-L3: Mild broad-based disc bulge eccentric towards the right. Mild bilateral facet arthropathy. Right lateral recess stenosis. No foraminal or central canal stenosis.   L3-L4: Broad-based disc bulge. No foraminal or central canal stenosis.   L4-L5: Broad-based disc bulge. Mild bilateral facet arthropathy with right facet effusion. No foraminal or central canal stenosis.   L5-S1: Mild broad-based disc bulge with a prominent left lateral disc osteophyte complex. Mild left facet arthropathy. No foraminal or central canal stenosis.   IMPRESSION: 1. Lumbar spine spondylosis as described above. 2. No acute osseous injury of the lumbar spine.     Electronically Signed   By: Kathreen Devoid M.D.   On: 11/15/2020 08:18     Objective:  VS:  HT:     WT:    BMI:      BP:120/78   HR:87bpm   TEMP: ( )   RESP:  Physical Exam Vitals and nursing note reviewed.  Constitutional:      General: She is not in acute distress.    Appearance: Normal appearance. She is not ill-appearing.  HENT:     Head: Normocephalic and atraumatic.     Right Ear: External ear normal.     Left Ear: External ear normal.  Eyes:     Extraocular Movements: Extraocular movements intact.  Cardiovascular:     Rate and Rhythm: Normal rate.     Pulses: Normal pulses.  Pulmonary:     Effort: Pulmonary effort is normal. No respiratory distress.  Abdominal:     General: There is no distension.     Palpations: Abdomen is soft.  Musculoskeletal:        General: Tenderness present.     Cervical back: Neck supple.      Right lower leg: No edema.     Left lower leg: No edema.     Comments: Patient has good distal strength with no pain over the greater trochanters.  No clonus or focal weakness.  Skin:    Findings: No erythema, lesion or rash.  Neurological:     General: No focal deficit present.     Mental Status: She is alert and oriented to person, place, and time.     Sensory: No sensory deficit.     Motor: No weakness or abnormal muscle tone.     Coordination: Coordination normal.  Psychiatric:        Mood and Affect: Mood normal.        Behavior: Behavior normal.     Imaging: No results found.

## 2021-01-07 NOTE — Procedures (Signed)
Lumbar Epidural Steroid Injection - Interlaminar Approach with Fluoroscopic Guidance  Patient: Julia Mora      Date of Birth: 04-14-1950 MRN: [id number] PCP: Cheron Schaumann., MD      Visit Date: 12/24/2020   Universal Protocol:     Consent Given By: the patient  Position: PRONE  Additional Comments: Vital signs were monitored before and after the procedure. Patient was prepped and draped in the usual sterile fashion. The correct patient, procedure, and site was verified.   Injection Procedure Details:   Procedure diagnoses: Lumbar radiculopathy [M54.16]   Meds Administered:  Meds ordered this encounter  Medications   methylPREDNISolone acetate (DEPO-MEDROL) injection 80 mg     Laterality: Left  Location/Site:  L5-S1  Needle: 3.5 in., 20 ga. Tuohy  Needle Placement: Paramedian epidural  Findings:   -Comments: Excellent flow of contrast into the epidural space.  Procedure Details: Using a paramedian approach from the side mentioned above, the region overlying the inferior lamina was localized under fluoroscopic visualization and the soft tissues overlying this structure were infiltrated with 4 ml. of 1% Lidocaine without Epinephrine. The Tuohy needle was inserted into the epidural space using a paramedian approach.   The epidural space was localized using loss of resistance along with counter oblique bi-planar fluoroscopic views.  After negative aspirate for air, blood, and CSF, a 2 ml. volume of Isovue-250 was injected into the epidural space and the flow of contrast was observed. Radiographs were obtained for documentation purposes.    The injectate was administered into the level noted above.   Additional Comments:  The patient tolerated the procedure well Dressing: 2 x 2 sterile gauze and Band-Aid    Post-procedure details: Patient was observed during the procedure. Post-procedure instructions were reviewed.  Patient left the clinic in stable  condition.

## 2021-01-13 ENCOUNTER — Encounter: Payer: Self-pay | Admitting: Physical Medicine and Rehabilitation

## 2021-01-13 ENCOUNTER — Ambulatory Visit: Payer: Self-pay

## 2021-01-13 ENCOUNTER — Ambulatory Visit (INDEPENDENT_AMBULATORY_CARE_PROVIDER_SITE_OTHER): Payer: Medicare Other | Admitting: Physical Medicine and Rehabilitation

## 2021-01-13 ENCOUNTER — Other Ambulatory Visit: Payer: Self-pay

## 2021-01-13 DIAGNOSIS — M5416 Radiculopathy, lumbar region: Secondary | ICD-10-CM

## 2021-01-13 MED ORDER — METHYLPREDNISOLONE ACETATE 80 MG/ML IJ SUSP
80.0000 mg | Freq: Once | INTRAMUSCULAR | Status: AC
  Administered 2021-01-13: 80 mg

## 2021-01-13 NOTE — Patient Instructions (Signed)

## 2021-01-13 NOTE — Progress Notes (Signed)
Pt state lower back pain that travels down the posterior left knee. Pt state sitting makes the pain worse. Pt state she takes pain meds and uses heat to help ease her pain.  Numeric Pain Rating Scale and Functional Assessment Average Pain 4   In the last MONTH (on 0-10 scale) has pain interfered with the following?  1. General activity like being  able to carry out your everyday physical activities such as walking, climbing stairs, carrying groceries, or moving a chair?  Rating(10)   +Driver, -BT, -Dye Allergies.

## 2021-02-06 NOTE — Procedures (Signed)
Lumbosacral Transforaminal Epidural Steroid Injection - Sub-Pedicular Approach with Fluoroscopic Guidance  Patient: Julia Mora      Date of Birth: 1950/01/19 MRN: [id number] PCP: Cheron Schaumann., MD      Visit Date: 01/13/2021   Universal Protocol:    Date/Time: 01/13/2021  Consent Given By: the patient  Position: PRONE  Additional Comments: Vital signs were monitored before and after the procedure. Patient was prepped and draped in the usual sterile fashion. The correct patient, procedure, and site was verified.   Injection Procedure Details:   Procedure diagnoses: Lumbar radiculopathy [M54.16]    Meds Administered:  Meds ordered this encounter  Medications   methylPREDNISolone acetate (DEPO-MEDROL) injection 80 mg    Laterality: Left  Location/Site: L5  Needle:5.0 in., 22 ga.  Short bevel or Quincke spinal needle  Needle Placement: Transforaminal  Findings:    -Comments: Excellent flow of contrast along the nerve, nerve root and into the epidural space.  Procedure Details: After squaring off the end-plates to get a true AP view, the C-arm was positioned so that an oblique view of the foramen as noted above was visualized. The target area is just inferior to the "nose of the scotty dog" or sub pedicular. The soft tissues overlying this structure were infiltrated with 2-3 ml. of 1% Lidocaine without Epinephrine.  The spinal needle was inserted toward the target using a "trajectory" view along the fluoroscope beam.  Under AP and lateral visualization, the needle was advanced so it did not puncture dura and was located close the 6 O'Clock position of the pedical in AP tracterory. Biplanar projections were used to confirm position. Aspiration was confirmed to be negative for CSF and/or blood. A 1-2 ml. volume of Isovue-250 was injected and flow of contrast was noted at each level. Radiographs were obtained for documentation purposes.   After attaining the  desired flow of contrast documented above, a 0.5 to 1.0 ml test dose of 0.25% Marcaine was injected into each respective transforaminal space.  The patient was observed for 90 seconds post injection.  After no sensory deficits were reported, and normal lower extremity motor function was noted,   the above injectate was administered so that equal amounts of the injectate were placed at each foramen (level) into the transforaminal epidural space.   Additional Comments:  No complications occurred Dressing: 2 x 2 sterile gauze and Band-Aid    Post-procedure details: Patient was observed during the procedure. Post-procedure instructions were reviewed.  Patient left the clinic in stable condition.

## 2021-02-06 NOTE — Progress Notes (Signed)
Julia Mora - 71 y.o. female MRN [id number]  Date of birth: February 19, 1950  Office Visit Note: Visit Date: 01/13/2021 PCP: Cheron SchaumannVelazquez, Gretchen Y., MD Referred by: Cheron SchaumannVelazquez, Gretchen Y.,*  Subjective: Chief Complaint  Patient presents with   Lower Back - Pain   Left Knee - Pain   HPI:  Julia Mora is a 71 y.o. female who comes in today at the request of Ellin GoodieMegan Williams, FNP for planned Left L5-S1 Lumbar Transforaminal epidural steroid injection with fluoroscopic guidance.  The patient has failed conservative care including home exercise, medications, time and activity modification.  This injection will be diagnostic and hopefully therapeutic.  Please see requesting physician notes for further details and justification.  ROS Otherwise per HPI.  Assessment & Plan: Visit Diagnoses:    ICD-10-CM   1. Lumbar radiculopathy  M54.16 XR C-ARM NO REPORT    Epidural Steroid injection    methylPREDNISolone acetate (DEPO-MEDROL) injection 80 mg      Plan: No additional findings.   Meds & Orders:  Meds ordered this encounter  Medications   methylPREDNISolone acetate (DEPO-MEDROL) injection 80 mg    Orders Placed This Encounter  Procedures   XR C-ARM NO REPORT   Epidural Steroid injection    Follow-up: Return if symptoms worsen or fail to improve.   Procedures: No procedures performed  Lumbosacral Transforaminal Epidural Steroid Injection - Sub-Pedicular Approach with Fluoroscopic Guidance  Patient: Julia Mora      Date of Birth: February 19, 1950 MRN: [id number] PCP: Cheron SchaumannVelazquez, Gretchen Y., MD      Visit Date: 01/13/2021   Universal Protocol:    Date/Time: 01/13/2021  Consent Given By: the patient  Position: PRONE  Additional Comments: Vital signs were monitored before and after the procedure. Patient was prepped and draped in the usual sterile fashion. The correct patient, procedure, and site was verified.   Injection Procedure Details:   Procedure diagnoses:  Lumbar radiculopathy [M54.16]    Meds Administered:  Meds ordered this encounter  Medications   methylPREDNISolone acetate (DEPO-MEDROL) injection 80 mg    Laterality: Left  Location/Site: L5  Needle:5.0 in., 22 ga.  Short bevel or Quincke spinal needle  Needle Placement: Transforaminal  Findings:    -Comments: Excellent flow of contrast along the nerve, nerve root and into the epidural space.  Procedure Details: After squaring off the end-plates to get a true AP view, the C-arm was positioned so that an oblique view of the foramen as noted above was visualized. The target area is just inferior to the "nose of the scotty dog" or sub pedicular. The soft tissues overlying this structure were infiltrated with 2-3 ml. of 1% Lidocaine without Epinephrine.  The spinal needle was inserted toward the target using a "trajectory" view along the fluoroscope beam.  Under AP and lateral visualization, the needle was advanced so it did not puncture dura and was located close the 6 O'Clock position of the pedical in AP tracterory. Biplanar projections were used to confirm position. Aspiration was confirmed to be negative for CSF and/or blood. A 1-2 ml. volume of Isovue-250 was injected and flow of contrast was noted at each level. Radiographs were obtained for documentation purposes.   After attaining the desired flow of contrast documented above, a 0.5 to 1.0 ml test dose of 0.25% Marcaine was injected into each respective transforaminal space.  The patient was observed for 90 seconds post injection.  After no sensory deficits were reported, and normal lower extremity motor function was noted,  the above injectate was administered so that equal amounts of the injectate were placed at each foramen (level) into the transforaminal epidural space.   Additional Comments:  No complications occurred Dressing: 2 x 2 sterile gauze and Band-Aid    Post-procedure details: Patient was observed during the  procedure. Post-procedure instructions were reviewed.  Patient left the clinic in stable condition.    Clinical History: MRI LUMBAR SPINE WITHOUT CONTRAST   TECHNIQUE: Multiplanar, multisequence MR imaging of the lumbar spine was performed. No intravenous contrast was administered.   COMPARISON:  None.   FINDINGS: Segmentation:  Standard.   Alignment: Levoscoliosis of the thoracolumbar spine. 2 mm retrolisthesis of L2 on L3. Minimal grade 1 anterolisthesis of L4 on L5 and L5 on S1 secondary to facet disease.   Vertebrae: No acute fracture, evidence of discitis, or aggressive bone lesion.   Conus medullaris and cauda equina: Conus extends to the T12-L1 level. Conus and cauda equina appear normal.   Paraspinal and other soft tissues: No acute paraspinal abnormality.   Disc levels:   Disc spaces: Degenerative disease with disc height loss at L2-3 and L3-4. Mild disc height loss at L5-S1.   T12-L1: No disc protrusion, foraminal stenosis or central canal stenosis.   L1-L2: Mild broad-based disc bulge. No foraminal or central canal stenosis.   L2-L3: Mild broad-based disc bulge eccentric towards the right. Mild bilateral facet arthropathy. Right lateral recess stenosis. No foraminal or central canal stenosis.   L3-L4: Broad-based disc bulge. No foraminal or central canal stenosis.   L4-L5: Broad-based disc bulge. Mild bilateral facet arthropathy with right facet effusion. No foraminal or central canal stenosis.   L5-S1: Mild broad-based disc bulge with a prominent left lateral disc osteophyte complex. Mild left facet arthropathy. No foraminal or central canal stenosis.   IMPRESSION: 1. Lumbar spine spondylosis as described above. 2. No acute osseous injury of the lumbar spine.     Electronically Signed   By: Kathreen Devoid M.D.   On: 11/15/2020 08:18     Objective:  VS:  HT:     WT:    BMI:      BP:    HR: bpm   TEMP: ( )   RESP:  Physical Exam Vitals and  nursing note reviewed.  Constitutional:      General: She is not in acute distress.    Appearance: Normal appearance. She is not ill-appearing.  HENT:     Head: Normocephalic and atraumatic.     Right Ear: External ear normal.     Left Ear: External ear normal.  Eyes:     Extraocular Movements: Extraocular movements intact.  Cardiovascular:     Rate and Rhythm: Normal rate.     Pulses: Normal pulses.  Pulmonary:     Effort: Pulmonary effort is normal. No respiratory distress.  Abdominal:     General: There is no distension.     Palpations: Abdomen is soft.  Musculoskeletal:        General: Tenderness present.     Cervical back: Neck supple.     Right lower leg: No edema.     Left lower leg: No edema.     Comments: Patient has good distal strength with no pain over the greater trochanters.  No clonus or focal weakness.  Skin:    Findings: No erythema, lesion or rash.  Neurological:     General: No focal deficit present.     Mental Status: She is alert and oriented to person,  place, and time.     Sensory: No sensory deficit.     Motor: No weakness or abnormal muscle tone.     Coordination: Coordination normal.  Psychiatric:        Mood and Affect: Mood normal.        Behavior: Behavior normal.     Imaging: No results found.

## 2021-03-02 ENCOUNTER — Telehealth: Payer: Self-pay | Admitting: Physical Medicine and Rehabilitation

## 2021-03-02 NOTE — Telephone Encounter (Signed)
Pt called. She states that she is looking to get some relief for her back. She said the epidural worked some but now the pain is coming back.   CB [phone]

## 2021-03-08 ENCOUNTER — Ambulatory Visit (INDEPENDENT_AMBULATORY_CARE_PROVIDER_SITE_OTHER): Payer: Medicare Other | Admitting: Physical Medicine and Rehabilitation

## 2021-03-08 ENCOUNTER — Other Ambulatory Visit: Payer: Self-pay

## 2021-03-08 ENCOUNTER — Encounter: Payer: Self-pay | Admitting: Physical Medicine and Rehabilitation

## 2021-03-08 VITALS — BP 143/87 | HR 76

## 2021-03-08 DIAGNOSIS — M5442 Lumbago with sciatica, left side: Secondary | ICD-10-CM | POA: Diagnosis not present

## 2021-03-08 DIAGNOSIS — M47816 Spondylosis without myelopathy or radiculopathy, lumbar region: Secondary | ICD-10-CM

## 2021-03-08 DIAGNOSIS — G8929 Other chronic pain: Secondary | ICD-10-CM

## 2021-03-08 DIAGNOSIS — M79605 Pain in left leg: Secondary | ICD-10-CM

## 2021-03-08 DIAGNOSIS — M5116 Intervertebral disc disorders with radiculopathy, lumbar region: Secondary | ICD-10-CM | POA: Diagnosis not present

## 2021-03-08 DIAGNOSIS — M5416 Radiculopathy, lumbar region: Secondary | ICD-10-CM | POA: Diagnosis not present

## 2021-03-08 NOTE — Progress Notes (Signed)
Pt has hx of inj on 01/13/21 pt state it helped. Pt state she still has pain from her lower back down the posterior of her left knee. Pt state she take over the counter pain meds to help ease her pain. ? ?Numeric Pain Rating Scale and Functional Assessment ?Average Pain 10 ?Pain Right Now 1 ?My pain is intermittent, burning, tingling, and aching ?Pain is worse with: some activites ?Pain improves with: medication and injections ? ? ?In the last MONTH (on 0-10 scale) has pain interfered with the following? ? ?1. General activity like being  able to carry out your everyday physical activities such as walking, climbing stairs, carrying groceries, or moving a chair?  ?Rating(6) ? ?2. Relation with others like being able to carry out your usual social activities and roles such as  activities at home, at work and in your community. ?Rating(7) ? ?3. Enjoyment of life such that you have  been bothered by emotional problems such as feeling anxious, depressed or irritable?  ?Rating(8) ? ?

## 2021-03-08 NOTE — Progress Notes (Signed)
Julia Mora - 71 y.o. female MRN [id number]  Date of birth: 1950/03/07  Office Visit Note: Visit Date: 03/08/2021 PCP: Loraine Leriche., MD Referred by: Loraine Leriche.,*  Subjective: Chief Complaint  Patient presents with   Lower Back - Pain   Left Knee - Pain   HPI: Julia Mora is a 71 y.o. female who comes in today for evaluation of chronic, worsening and severe left sided lower back pain radiating down posterolateral thigh to knee. Patient states she does have intermittent left sided lower back pain, however the most severe pain is to left posterolateral thigh radiating down to knee. Patient reports pain is exacerbated by prolonged sitting, describes as a burning and sore sensation, currently rates as 7 out of 10. Patient reports some relief of pain with home exercise regimen, rest and use of OTC medications. Patient also continues to have Gabapentin 100 mg TID prescribed by her primary care provider Dr. Idamae Schuller. Patient's lumbar MRI from 2022 exhibits multi-level facet hypertrophy and mild broad-based disc bulge with a prominent left lateral disc osteophyte complex at L5-S1. No high grade spinal canal stenosis noted. Patient had left L5 transforaminal epidural steroid injection on 01/13/2021 performed by Dr. Magnus Sinning and reports greater than 50% pain relief for 1 month. Patient states her pain has slowly started to return and is now severe. Patient had mechanical fall in 2021 where she sustained displaced, midshaft, left femur fracture that required ORIF with a left femoral IM rod by Dr. Meridee Score on 06/04/2019. Patient denies focal weakness, numbness and tingling. Patient denies recent trauma or falls.   Review of Systems  Musculoskeletal:  Positive for back pain.  Neurological:  Negative for tingling, sensory change, focal weakness and weakness.  All other systems reviewed and are negative. Otherwise per HPI.  Assessment & Plan: Visit Diagnoses:     ICD-10-CM   1. Lumbar radiculopathy  M54.16 Ambulatory referral to Physical Medicine Rehab    2. Chronic left-sided low back pain with left-sided sciatica  M54.42    G89.29     3. Pain in left leg  M79.605     4. Intervertebral disc disorders with radiculopathy, lumbar region  M51.16     5. Facet hypertrophy of lumbar region  M47.816        Plan: Findings:  Chronic, worsening and severe left sided lower back pain radiating down posterolateral thigh to knee. Most severe pain is to posterolateral thigh region down to knee. Patient continues to have excruciating and debilitating pain despite good conservative therapies such as home exercise regimen, rest and use of OTC medications. Patients clinical presentation and exam are consistent with both L5 and S1 nerve pattern, patient did get greater than 50% pain relief with recent left L5 transforaminal epidural steroid injection, however we can't exclude possible left knee etiology at this time. We believe the next step is to perform diagnostic and hopefully therapeutic left L5 and S1 transforaminal epidural steroid injection under fluoroscopic guidance. We will have patient follow up with [place] in approximately 3 weeks for re-evaluation. If patient does well with epidural steroid injection we will continue to monitor, however if her pain persists we would recommend follow-up with Dr. Meridee Score for evaluation of left knee pain. No red flag symptoms noted upon exam today.    Meds & Orders: No orders of the defined types were placed in this encounter.   Orders Placed This Encounter  Procedures   Ambulatory referral to Physical Medicine  Rehab    Follow-up: Return for Left L5 and S1 transforaminal epidural steroid injection.   Procedures: No procedures performed      Clinical History: MRI LUMBAR SPINE WITHOUT CONTRAST   TECHNIQUE: Multiplanar, multisequence MR imaging of the lumbar spine was performed. No intravenous contrast was  administered.   COMPARISON:  None.   FINDINGS: Segmentation:  Standard.   Alignment: Levoscoliosis of the thoracolumbar spine. 2 mm retrolisthesis of L2 on L3. Minimal grade 1 anterolisthesis of L4 on L5 and L5 on S1 secondary to facet disease.   Vertebrae: No acute fracture, evidence of discitis, or aggressive bone lesion.   Conus medullaris and cauda equina: Conus extends to the T12-L1 level. Conus and cauda equina appear normal.   Paraspinal and other soft tissues: No acute paraspinal abnormality.   Disc levels:   Disc spaces: Degenerative disease with disc height loss at L2-3 and L3-4. Mild disc height loss at L5-S1.   T12-L1: No disc protrusion, foraminal stenosis or central canal stenosis.   L1-L2: Mild broad-based disc bulge. No foraminal or central canal stenosis.   L2-L3: Mild broad-based disc bulge eccentric towards the right. Mild bilateral facet arthropathy. Right lateral recess stenosis. No foraminal or central canal stenosis.   L3-L4: Broad-based disc bulge. No foraminal or central canal stenosis.   L4-L5: Broad-based disc bulge. Mild bilateral facet arthropathy with right facet effusion. No foraminal or central canal stenosis.   L5-S1: Mild broad-based disc bulge with a prominent left lateral disc osteophyte complex. Mild left facet arthropathy. No foraminal or central canal stenosis.   IMPRESSION: 1. Lumbar spine spondylosis as described above. 2. No acute osseous injury of the lumbar spine.     Electronically Signed   By: Kathreen Devoid M.D.   On: 11/15/2020 08:18   She reports that she has never smoked. She has never used smokeless tobacco. No results for input(s): HGBA1C, LABURIC in the last 8760 hours.  Objective:  VS:  HT:     WT:    BMI:      BP:(!) 143/87   HR:76bpm   TEMP: ( )   RESP:  Physical Exam Vitals and nursing note reviewed.  HENT:     Head: Normocephalic and atraumatic.     Right Ear: External ear normal.     Left Ear:  External ear normal.     Nose: Nose normal.     Mouth/Throat:     Mouth: Mucous membranes are moist.  Eyes:     Pupils: Pupils are equal, round, and reactive to light.  Cardiovascular:     Rate and Rhythm: Normal rate.     Pulses: Normal pulses.  Pulmonary:     Effort: Pulmonary effort is normal.  Abdominal:     General: Abdomen is flat. There is no distension.  Musculoskeletal:        General: Tenderness present.     Cervical back: Normal range of motion.     Comments: Pt rises from seated position to standing without difficulty. Good lumbar range of motion. Strong distal strength without clonus, no pain upon palpation of greater trochanters. Dysesthesias noted left L5 and S1 dermatomes. Sensation intact bilaterally. Walks independently, gait steady.   Skin:    General: Skin is warm and dry.     Capillary Refill: Capillary refill takes less than 2 seconds.  Neurological:     General: No focal deficit present.     Mental Status: She is alert and oriented to person, place, and time.  Psychiatric:        Mood and Affect: Mood normal.        Behavior: Behavior normal.    Ortho Exam  Imaging: No results found.  Past Medical/Family/Surgical/Social History: Medications & Allergies reviewed per EMR, new medications updated. Patient Active Problem List   Diagnosis Date Noted   Fall    Closed left femoral fracture ([facility]) 06/01/2019   Essential hypertension 06/01/2019   CKD (chronic kidney disease) stage 3, GFR 30-59 ml/min ([facility]) 06/01/2019   Left knee injury 08/21/2012   Right foot pain 07/24/2012   Past Medical History:  Diagnosis Date   CKD (chronic kidney disease) stage 3, GFR 30-59 ml/min (HCC)    Hypertension    Osteoporosis    Family History  Problem Relation Age of Onset   Heart attack Father    Diabetes Neg Hx    Hyperlipidemia Neg Hx    Hypertension Neg Hx    Past Surgical History:  Procedure Laterality Date   ABDOMINAL HYSTERECTOMY     ANUS SURGERY      BLADDER SURGERY     FEMUR IM NAIL Left 06/02/2019   Procedure: INTRAMEDULLARY (IM) NAIL FEMORAL;  Surgeon: Newt Minion, MD;  Location: [facility];  Service: Orthopedics;  Laterality: Left;   Social History   Occupational History   Not on file  Tobacco Use   Smoking status: Never   Smokeless tobacco: Never  Vaping Use   Vaping Use: Never used  Substance and Sexual Activity   Alcohol use: No   Drug use: No   Sexual activity: Not on file

## 2021-03-17 ENCOUNTER — Telehealth: Payer: Self-pay | Admitting: Physical Medicine and Rehabilitation

## 2021-03-17 NOTE — Telephone Encounter (Signed)
Please call the pt regarding tomorrows procedure  ? ? ?[phone] ?

## 2021-03-18 ENCOUNTER — Ambulatory Visit (INDEPENDENT_AMBULATORY_CARE_PROVIDER_SITE_OTHER): Payer: Medicare Other | Admitting: Physical Medicine and Rehabilitation

## 2021-03-18 ENCOUNTER — Ambulatory Visit: Payer: Self-pay

## 2021-03-18 ENCOUNTER — Other Ambulatory Visit: Payer: Self-pay

## 2021-03-18 ENCOUNTER — Encounter: Payer: Self-pay | Admitting: Physical Medicine and Rehabilitation

## 2021-03-18 VITALS — BP 146/84 | HR 88

## 2021-03-18 DIAGNOSIS — M5416 Radiculopathy, lumbar region: Secondary | ICD-10-CM

## 2021-03-18 MED ORDER — METHYLPREDNISOLONE ACETATE 80 MG/ML IJ SUSP
80.0000 mg | Freq: Once | INTRAMUSCULAR | Status: AC
  Administered 2021-03-18: 80 mg

## 2021-03-18 NOTE — Patient Instructions (Signed)

## 2021-03-18 NOTE — Progress Notes (Signed)
Pt state lower back pain that travels down the posterior left knee. Pt state sitting makes the pain worse. Pt state she takes pain meds and uses heat to help ease her pain. ? ?Numeric Pain Rating Scale and Functional Assessment ?Average Pain 3 ? ? ?In the last MONTH (on 0-10 scale) has pain interfered with the following? ? ?1. General activity like being  able to carry out your everyday physical activities such as walking, climbing stairs, carrying groceries, or moving a chair?  ?Rating(10) ? ? ?+Driver, -BT, -Dye Allergies. ? ?

## 2021-03-30 NOTE — Procedures (Signed)
Lumbosacral Transforaminal Epidural Steroid Injection - Sub-Pedicular Approach with Fluoroscopic Guidance ? ?Patient: Julia Mora      ?Date of Birth: Apr 22, 1950 ?MRN: [id number] ?PCP: Loraine Leriche., MD      ?Visit Date: 03/18/2021 ?  ?Universal Protocol:    ?Date/Time: 03/18/2021 ? ?Consent Given By: the patient ? ?Position: PRONE ? ?Additional Comments: ?Vital signs were monitored before and after the procedure. ?Patient was prepped and draped in the usual sterile fashion. ?The correct patient, procedure, and site was verified. ? ? ?Injection Procedure Details:  ? ?Procedure diagnoses: Lumbar radiculopathy [M54.16]   ? ?Meds Administered:  ?Meds ordered this encounter  ?Medications  ? methylPREDNISolone acetate (DEPO-MEDROL) injection 80 mg  ? ? ?Laterality: Left ? ?Location/Site: L5 and S1 ? ?Needle:5.0 in., 22 ga.  Short bevel or Quincke spinal needle ? ?Needle Placement: Transforaminal ? ?Findings: ?  ? -Comments: Excellent flow of contrast along the nerve, nerve root and into the epidural space. ? ?Procedure Details: ?After squaring off the end-plates to get a true AP view, the C-arm was positioned so that an oblique view of the foramen as noted above was visualized. The target area is just inferior to the "nose of the scotty dog" or sub pedicular. The soft tissues overlying this structure were infiltrated with 2-3 ml. of 1% Lidocaine without Epinephrine. ? ?The spinal needle was inserted toward the target using a "trajectory" view along the fluoroscope beam.  Under AP and lateral visualization, the needle was advanced so it did not puncture dura and was located close the 6 O'Clock position of the pedical in AP tracterory. Biplanar projections were used to confirm position. Aspiration was confirmed to be negative for CSF and/or blood. A 1-2 ml. volume of Isovue-250 was injected and flow of contrast was noted at each level. Radiographs were obtained for documentation purposes.  ? ?After attaining  the desired flow of contrast documented above, a 0.5 to 1.0 ml test dose of 0.25% Marcaine was injected into each respective transforaminal space.  The patient was observed for 90 seconds post injection.  After no sensory deficits were reported, and normal lower extremity motor function was noted,   the above injectate was administered so that equal amounts of the injectate were placed at each foramen (level) into the transforaminal epidural space. ? ? ?Additional Comments:  ?The patient tolerated the procedure well ?Dressing: 2 x 2 sterile gauze and Band-Aid ?  ? ?Post-procedure details: ?Patient was observed during the procedure. ?Post-procedure instructions were reviewed. ? ?Patient left the clinic in stable condition. ? ?

## 2021-03-30 NOTE — Progress Notes (Signed)
? ?Julia Mora - 71 y.o. female MRN [id number]  Date of birth: 06/04/1950 ? ?Office Visit Note: ?Visit Date: 03/18/2021 ?PCP: Cheron Schaumann., MD ?Referred by: Cheron Schaumann.,* ? ?Subjective: ?Chief Complaint  ?Patient presents with  ? Lower Back - Pain  ? Left Knee - Pain  ? ?HPI:  Julia Mora is a 71 y.o. female who comes in today at the request of Ellin Goodie, FNP for planned Left L5-S1 and S1-2 Lumbar Transforaminal epidural steroid injection with fluoroscopic guidance.  The patient has failed conservative care including home exercise, medications, time and activity modification.  This injection will be diagnostic and hopefully therapeutic.  Please see requesting physician notes for further details and justification. ? ?ROS Otherwise per HPI. ? ?Assessment & Plan: ?Visit Diagnoses:  ?  ICD-10-CM   ?1. Lumbar radiculopathy  M54.16 XR C-ARM NO REPORT  ?  Epidural Steroid injection  ?  methylPREDNISolone acetate (DEPO-MEDROL) injection 80 mg  ?  ?  ?Plan: No additional findings.  ? ?Meds & Orders:  ?Meds ordered this encounter  ?Medications  ? methylPREDNISolone acetate (DEPO-MEDROL) injection 80 mg  ?  ?Orders Placed This Encounter  ?Procedures  ? XR C-ARM NO REPORT  ? Epidural Steroid injection  ?  ?Follow-up: Return if symptoms worsen or fail to improve.  ? ?Procedures: ?No procedures performed  ?Lumbosacral Transforaminal Epidural Steroid Injection - Sub-Pedicular Approach with Fluoroscopic Guidance ? ?Patient: Julia Mora      ?Date of Birth: 02-Jul-1950 ?MRN: [id number] ?PCP: Cheron Schaumann., MD      ?Visit Date: 03/18/2021 ?  ?Universal Protocol:    ?Date/Time: 03/18/2021 ? ?Consent Given By: the patient ? ?Position: PRONE ? ?Additional Comments: ?Vital signs were monitored before and after the procedure. ?Patient was prepped and draped in the usual sterile fashion. ?The correct patient, procedure, and site was verified. ? ? ?Injection Procedure Details:  ? ?Procedure  diagnoses: Lumbar radiculopathy [M54.16]   ? ?Meds Administered:  ?Meds ordered this encounter  ?Medications  ? methylPREDNISolone acetate (DEPO-MEDROL) injection 80 mg  ? ? ?Laterality: Left ? ?Location/Site: L5 and S1 ? ?Needle:5.0 in., 22 ga.  Short bevel or Quincke spinal needle ? ?Needle Placement: Transforaminal ? ?Findings: ?  ? -Comments: Excellent flow of contrast along the nerve, nerve root and into the epidural space. ? ?Procedure Details: ?After squaring off the end-plates to get a true AP view, the C-arm was positioned so that an oblique view of the foramen as noted above was visualized. The target area is just inferior to the "nose of the scotty dog" or sub pedicular. The soft tissues overlying this structure were infiltrated with 2-3 ml. of 1% Lidocaine without Epinephrine. ? ?The spinal needle was inserted toward the target using a "trajectory" view along the fluoroscope beam.  Under AP and lateral visualization, the needle was advanced so it did not puncture dura and was located close the 6 O'Clock position of the pedical in AP tracterory. Biplanar projections were used to confirm position. Aspiration was confirmed to be negative for CSF and/or blood. A 1-2 ml. volume of Isovue-250 was injected and flow of contrast was noted at each level. Radiographs were obtained for documentation purposes.  ? ?After attaining the desired flow of contrast documented above, a 0.5 to 1.0 ml test dose of 0.25% Marcaine was injected into each respective transforaminal space.  The patient was observed for 90 seconds post injection.  After no sensory deficits were reported, and normal lower extremity motor  function was noted,   the above injectate was administered so that equal amounts of the injectate were placed at each foramen (level) into the transforaminal epidural space. ? ? ?Additional Comments:  ?The patient tolerated the procedure well ?Dressing: 2 x 2 sterile gauze and Band-Aid ?  ? ?Post-procedure  details: ?Patient was observed during the procedure. ?Post-procedure instructions were reviewed. ? ?Patient left the clinic in stable condition. ?  ? ?Clinical History: ?No specialty comments available.  ? ? ? ?Objective:  VS:  HT:    WT:   BMI:     BP:(!) 146/84  HR:88bpm  TEMP: ( )  RESP:  ?Physical Exam ?Vitals and nursing note reviewed.  ?Constitutional:   ?   General: She is not in acute distress. ?   Appearance: Normal appearance. She is not ill-appearing.  ?HENT:  ?   Head: Normocephalic and atraumatic.  ?   Right Ear: External ear normal.  ?   Left Ear: External ear normal.  ?Eyes:  ?   Extraocular Movements: Extraocular movements intact.  ?Cardiovascular:  ?   Rate and Rhythm: Normal rate.  ?   Pulses: Normal pulses.  ?Pulmonary:  ?   Effort: Pulmonary effort is normal. No respiratory distress.  ?Abdominal:  ?   General: There is no distension.  ?   Palpations: Abdomen is soft.  ?Musculoskeletal:     ?   General: Tenderness present.  ?   Cervical back: Neck supple.  ?   Right lower leg: No edema.  ?   Left lower leg: No edema.  ?   Comments: Patient has good distal strength with no pain over the greater trochanters.  No clonus or focal weakness.  ?Skin: ?   Findings: No erythema, lesion or rash.  ?Neurological:  ?   General: No focal deficit present.  ?   Mental Status: She is alert and oriented to person, place, and time.  ?   Sensory: No sensory deficit.  ?   Motor: No weakness or abnormal muscle tone.  ?   Coordination: Coordination normal.  ?Psychiatric:     ?   Mood and Affect: Mood normal.     ?   Behavior: Behavior normal.  ?  ? ?Imaging: ?No results found. ?

## 2021-04-07 ENCOUNTER — Encounter: Payer: Self-pay | Admitting: Physical Medicine and Rehabilitation

## 2021-04-07 ENCOUNTER — Ambulatory Visit: Payer: Medicare Other | Admitting: Physical Medicine and Rehabilitation

## 2021-04-07 VITALS — BP 164/82 | HR 74

## 2021-04-07 DIAGNOSIS — G8929 Other chronic pain: Secondary | ICD-10-CM | POA: Diagnosis not present

## 2021-04-07 DIAGNOSIS — M25562 Pain in left knee: Secondary | ICD-10-CM | POA: Diagnosis not present

## 2021-04-07 DIAGNOSIS — M5416 Radiculopathy, lumbar region: Secondary | ICD-10-CM | POA: Diagnosis not present

## 2021-04-07 DIAGNOSIS — M5442 Lumbago with sciatica, left side: Secondary | ICD-10-CM | POA: Diagnosis not present

## 2021-04-07 DIAGNOSIS — M5116 Intervertebral disc disorders with radiculopathy, lumbar region: Secondary | ICD-10-CM | POA: Diagnosis not present

## 2021-04-07 MED ORDER — PREGABALIN 50 MG PO CAPS: ORAL_CAPSULE | ORAL | 0 refills | Status: DC

## 2021-04-07 NOTE — Progress Notes (Signed)
? ?[name] - 71 y.o. female MRN [id number]  Date of birth: 05-15-1950 ? ?Office Visit Note: ?Visit Date: 04/07/2021 ?PCP: Loraine Leriche., MD ?Referred by: Loraine Leriche.,* ? ?Subjective: ?Chief Complaint  ?Patient presents with  ? Lower Back - Pain  ? Left Knee - Pain  ? ?HPI: Julia Mora is a 71 y.o. female who comes in today for evaluation of chronic, worsening and severe left sided lower back pain radiating to buttock, posterolateral thigh down to knee. Patients most severe issue at this time is localized pain to lateral region of left posterior knee. Patient reports symptoms have been ongoing for several months. Patient reports pain is exacerbated by movement and activity, describes as constant sore and aching sensation, currently rates as 8 out of 10. Patient reports some relief of pain with home exercise regimen, rest and use of medications. She is currently taking Gabapentin 100 mg TID. Patient states history of formal physical therapy in the past for balance issues. Patient's lumbar MRI from 2022 exhibits multi-level facet hypertrophy and mild broad-based disc bulge with a prominent left lateral disc osteophyte complex at L5-S1. No high grade spinal canal stenosis noted. Patient has history of multiple lumbar epidural steroid injections performed in our office, most recent was left L5 and S1 transforaminal epidural steroid injection performed on 03/18/2021 and reports significant relief for approximately 4 days. Patient has history of left femoral shaft fracture from mechanical fall in 2021, repaired by Dr. Meridee Score. Patient states she is trying to manage her discomfort at home, however left sided posterior knee pain is making it difficult to walk and perform daily tasks. Patient denies focal weakness, numbness and tingling. Patient denies recent trauma or falls.  ? ?Review of Systems  ?Musculoskeletal:  Positive for back pain and joint pain.  ?Neurological:  Negative for  tingling, sensory change, focal weakness and weakness.  ?All other systems reviewed and are negative. Otherwise per HPI. ? ?Assessment & Plan: ?Visit Diagnoses:  ?  ICD-10-CM   ?1. Lumbar radiculopathy  M54.16 Ambulatory referral to Physical Therapy  ?  ?2. Chronic left-sided low back pain with left-sided sciatica  M54.42 Ambulatory referral to Physical Therapy  ? G89.29   ?  ?3. Intervertebral disc disorders with radiculopathy, lumbar region  M51.16 Ambulatory referral to Physical Therapy  ?  ?4. Posterior left knee pain  M25.562 Ambulatory referral to Physical Therapy  ?  ?   ?Plan: Findings:  ?Chronic, worsening and severe left-sided lower back pain radiating to buttock, posterolateral thigh down to knee.  Most severe discomfort is localized to lateral aspect of left posterior knee.  Patient continues to have severe pain despite good conservative therapy such as home exercise regimen, rest and use of medications.  Patient's clinical presentation and exam are complex, differentials could include lumbar radiculopathy, mechanical knee pain or possible tendinitis. Patient has had numerous epidural steroid injections performed in our office over the last several months that have provided significant pain relief, however pain relief is short lived, only lasting a few days. We do not feel repeating lumbar epidural steroid injection at this time would be beneficial to patient. We feel the next step would be to attend formal physical therapy with a focus on iontophoresis, dry needing and gait training. I did place a referral for physical therapy at [facility] today. I also spoke with patient about medication management. I instructed patient to discontinue Gabapentin and placed new prescription for Lyrica 50 mg  once at night for a week, if tolerating well will titrate up to twice a day. If patients pain persists we would consider having her follow up with Dr. Meridee Score for re-evaluation of left  posterior knee pain. I would like to see patient back in approximately 6 weeks for re-evaluation. No red flag symptoms noted upon exam today.   ? ?Meds & Orders:  ?Meds ordered this encounter  ?Medications  ? pregabalin (LYRICA) 50 MG capsule  ?  Sig: Take 1 tablet by mouth at night for 5-7 nights, then take 1 tablet in the morning and 1 tablet at night.  ?  Dispense:  60 capsule  ?  Refill:  0  ?  Order Specific Question:   Supervising Provider  ?  AnswerMagnus Sinning [id number]  ?  ?Orders Placed This Encounter  ?Procedures  ? Ambulatory referral to Physical Therapy  ?  ?Follow-up: Return for 6 week follow up for re-evaluation .  ? ?Procedures: ?No procedures performed  ?   ? ?Clinical History: ?MRI LUMBAR SPINE WITHOUT CONTRAST  ?   ?TECHNIQUE:  ?Multiplanar, multisequence MR imaging of the lumbar spine was  ?performed. No intravenous contrast was administered.  ?   ?COMPARISON:  None.  ?   ?FINDINGS:  ?Segmentation:  Standard.  ?   ?Alignment: Levoscoliosis of the thoracolumbar spine. 2 mm  ?retrolisthesis of L2 on L3. Minimal grade 1 anterolisthesis of L4 on  ?L5 and L5 on S1 secondary to facet disease.  ?   ?Vertebrae: No acute fracture, evidence of discitis, or aggressive  ?bone lesion.  ?   ?Conus medullaris and cauda equina: Conus extends to the T12-L1  ?level. Conus and cauda equina appear normal.  ?   ?Paraspinal and other soft tissues: No acute paraspinal abnormality.  ?   ?Disc levels:  ?   ?Disc spaces: Degenerative disease with disc height loss at L2-3 and  ?L3-4. Mild disc height loss at L5-S1.  ?   ?T12-L1: No disc protrusion, foraminal stenosis or central canal  ?stenosis.  ?   ?L1-L2: Mild broad-based disc bulge. No foraminal or central canal  ?stenosis.  ?   ?L2-L3: Mild broad-based disc bulge eccentric towards the right. Mild  ?bilateral facet arthropathy. Right lateral recess stenosis. No  ?foraminal or central canal stenosis.  ?   ?L3-L4: Broad-based disc bulge. No foraminal or central  canal  ?stenosis.  ?   ?L4-L5: Broad-based disc bulge. Mild bilateral facet arthropathy with  ?right facet effusion. No foraminal or central canal stenosis.  ?   ?L5-S1: Mild broad-based disc bulge with a prominent left lateral  ?disc osteophyte complex. Mild left facet arthropathy. No foraminal  ?or central canal stenosis.  ?   ?IMPRESSION:  ?1. Lumbar spine spondylosis as described above.  ?2. No acute osseous injury of the lumbar spine.  ?   ?   ?Electronically Signed  ?  By: Kathreen Devoid M.D.  ?  On: 11/15/2020 08:18  ? ?She reports that she has never smoked. She has never used smokeless tobacco. No results for input(s): HGBA1C, LABURIC in the last 8760 hours. ? ?Objective:  VS:  HT:    WT:   BMI:     BP:(!) 164/82  HR:74bpm  TEMP: ( )  RESP:  ?Physical Exam ?Vitals and nursing note reviewed.  ?HENT:  ?   Head: Normocephalic and atraumatic.  ?   Right Ear: External ear normal.  ?   Left Ear: External ear  normal.  ?   Nose: Nose normal.  ?   Mouth/Throat:  ?   Mouth: Mucous membranes are moist.  ?Eyes:  ?   Extraocular Movements: Extraocular movements intact.  ?Cardiovascular:  ?   Rate and Rhythm: Normal rate.  ?   Pulses: Normal pulses.  ?Pulmonary:  ?   Effort: Pulmonary effort is normal.  ?Abdominal:  ?   General: Abdomen is flat. There is no distension.  ?Musculoskeletal:     ?   General: Tenderness present.  ?   Cervical back: Normal range of motion.  ?   Comments: Pt rises from seated position to standing without difficulty. Good lumbar range of motion. Strong distal strength without clonus, no pain upon palpation of greater trochanters. Dysesthesias noted left L5 and S1 dermatomes. Sensation intact bilaterally. Walks independently, gait steady.  ? ?Full active and passive range of motion noted with left knee. No crepitus noted. Tenderness noted upon palpation of posterolateral region. No effusion noted.   ?Skin: ?   General: Skin is warm and dry.  ?   Capillary Refill: Capillary refill takes less  than 2 seconds.  ?Neurological:  ?   General: No focal deficit present.  ?   Mental Status: She is alert and oriented to person, place, and time.  ?Psychiatric:     ?   Mood and Affect: Mood normal.     ?   Behavior: Be

## 2021-04-07 NOTE — Progress Notes (Signed)
Pt state lower back pain that travels down the posterior left knee. Pt state sitting makes the pain worse. Pt state she takes pain meds and uses heat to help ease her pain. ? ?Numeric Pain Rating Scale and Functional Assessment ?Average Pain 9 ?Pain Right Now 6 ?My pain is constant, sharp, and aching ?Pain is worse with: sitting ?Pain improves with: medication ? ? ?In the last MONTH (on 0-10 scale) has pain interfered with the following? ? ?1. General activity like being  able to carry out your everyday physical activities such as walking, climbing stairs, carrying groceries, or moving a chair?  ?Rating(5) ? ?2. Relation with others like being able to carry out your usual social activities and roles such as  activities at home, at work and in your community. ?Rating(6) ? ?3. Enjoyment of life such that you have  been bothered by emotional problems such as feeling anxious, depressed or irritable?  ?Rating(7) ? ?

## 2021-05-11 NOTE — Therapy (Signed)
?OUTPATIENT PHYSICAL THERAPY THORACOLUMBAR EVALUATION ? ? ?Patient Name: Julia Mora ?MRN: [id number] ?DOB:Oct 03, 1950, 71 y.o., female ?Today's Date: 05/12/2021 ? ? PT End of Session - 05/12/21 1351   ? ? Visit Number 1   ? Date for PT Re-Evaluation 06/23/21   ? Authorization Type MCR   ? Progress Note Due on Visit 10   ? PT Start Time 1351   ? PT Stop Time [id number]   ? PT Time Calculation (min) 51 min   ? Activity Tolerance Patient tolerated treatment well   ? Behavior During Therapy [facility] for tasks assessed/performed   ? ?  ?  ? ?  ? ? ?Past Medical History:  ?Diagnosis Date  ? CKD (chronic kidney disease) stage 3, GFR 30-59 ml/min (HCC)   ? Hypertension   ? Osteoporosis   ? ?Past Surgical History:  ?Procedure Laterality Date  ? ABDOMINAL HYSTERECTOMY    ? ANUS SURGERY    ? BLADDER SURGERY    ? FEMUR IM NAIL Left 06/02/2019  ? Procedure: INTRAMEDULLARY (IM) NAIL FEMORAL;  Surgeon: Newt Minion, MD;  Location: [place];  Service: Orthopedics;  Laterality: Left;  ? ?Patient Active Problem List  ? Diagnosis Date Noted  ? Fall   ? Closed left femoral fracture ([place]) 06/01/2019  ? Essential hypertension 06/01/2019  ? CKD (chronic kidney disease) stage 3, GFR 30-59 ml/min ([place]) 06/01/2019  ? Left knee injury 08/21/2012  ? Right foot pain 07/24/2012  ? ? ?PCP: Loraine Leriche., MD ? ?REFERRING PROVIDER: Lorine Bears, NP ? ?REFERRING DIAG:  ?M54.16 (ICD-10-CM) - Lumbar radiculopathy  ?M54.42,G89.29 (ICD-10-CM) - Chronic left-sided low back pain with left-sided sciatica  ?M51.16 (ICD-10-CM) - Intervertebral disc disorders with radiculopathy, lumbar region  ?M25.562 (ICD-10-CM) - Posterior left knee pain  ? ? ?THERAPY DIAG:  ?Radiculopathy, lumbar region ? ?Muscle weakness (generalized) ? ?Unsteadiness on feet ? ?Cramp and spasm ? ?ONSET DATE: 04/03/21 ? ?SUBJECTIVE:                                                                                                                                                                                           ? ?SUBJECTIVE STATEMENT: ?Patient with 2 year h/o sciatica. She's had injections with only temporary relief. She fractured her left femur on 06/01/19. Just switched from Gabapentin to Lyrica and it is helping. Right now she has left leg pain usually to the posterior knee; occasionally to the toes. All ADLs are painful and she also reports balance issues. This has improved in the past month also. Going down her declined driveway is more difficult than going up. She uses a  diagonal pattern usually. ? ? ?PERTINENT HISTORY:  ?Chronic left sided LBP with radicuopathy, scoliosis, heel lift left, osteopenia, left femoral shaft fracture from mechanical fall in 2021, HTN, CKD ?  ?PAIN:  ?Are you having pain? Yes: NPRS scale: 1/10 ?Pain location: just above and below post knee ?Pain description: pain and tingling ?Aggravating factors: sitting ?Relieving factors: extending her knee ? ? ?PRECAUTIONS: Fall ? ?WEIGHT BEARING RESTRICTIONS No ? ?FALLS:  ?Has patient fallen in last 6 months?  No falls, but her husband caught her 2 days ago when she lost her balance on her inclined driveway.  ? ?LIVING ENVIRONMENT: ?Lives with: lives with their spouse ?Lives in: House/apartment ?Stairs: Yes: Internal: 14 steps; on right going up and External: 3 steps; can reach both ?Has following equipment at home: None ? ?OCCUPATION: retired ? ?PLOF: Independent ? ?PATIENT GOALS wants to improve her QOL, balance and decrease or get rid of sciatica ? ? ?OBJECTIVE:  ? ?DIAGNOSTIC FINDINGS:  ?MRI from 2022 exhibits multi-level facet hypertrophy and mild broad-based disc bulge with a prominent left lateral disc osteophyte complex at L5-S1. No high grade spinal canal stenosis noted. Patient has history of multiple lumbar epidural steroid injections performed in Dr. Eric Form office, most recent was left L5 and S1 transforaminal epidural steroid injection performed on 03/18/2021 and reports significant relief for  approximately 4 days. Patient has history of left femoral shaft fracture from mechanical fall in 2021 ? ?PATIENT SURVEYS:  ?FOTO 56 (predicted 98) ? ?COGNITION: ? Overall cognitive status: Within functional limits for tasks assessed   ?  ?SENSATION: ?Light touch: WFL ? ?MUSCLE LENGTH: ? Bil piriformis L>R, bil HS R>L, quads/HF WNL ? ?POSTURE:  ?Depressed right shoulder, left convexity scoliosis, left hip ER in standing ? ?PALPATION: ?L lateral HS distally and peroneals, L PSIS, R ITB; bil QL ?Spinal mobility: limited by tight lumbar muscles bil with pain L>R ? ?LUMBAR ROM:  ? ?Active  A/PROM  ?05/12/2021  ?Flexion Hands to mid shin  ?Extension 75% feels unsteady  ?Right lateral flexion Limited 50%  ?Left lateral flexion WFL  ?Right rotation WNL  ?Left rotation WNL  ? (Blank rows = not tested) ? ?LE ROM:  ?Bil hips WFL except IR/ER due to muscular tightness; bil knees WNL ? ?LE MMT: ? ?MMT Right ?05/12/2021 Left ?05/12/2021  ?Hip flexion 4+ 4+  ?Hip extension 4- 4-  ?Hip abduction 4+ 4+  ?Hip adduction 5 4+  ?Hip internal rotation    ?Hip external rotation    ?Knee flexion 5 5  ?Knee extension 5 5  ?Ankle dorsiflexion 5 5  ?Ankle plantarflexion    ?Ankle inversion    ?Ankle eversion    ? (Blank rows = not tested) ? ?LUMBAR SPECIAL TESTS:  ?Straight leg raise test: Positive and Slump test: Positive Left side ? ?FUNCTIONAL TESTS:  ?5 times sit to stand: 13.13 sec ? ? ? ?TODAY'S TREATMENT  ?See HEP ? ? ?PATIENT EDUCATION:  ?Education details: HEP; POC ?Person educated: Patient ?Education method: Explanation, Demonstration, and Handouts ?Education comprehension: verbalized understanding and returned demonstration ? ? ?HOME EXERCISE PROGRAM: ?Access Code: [email or web address] ?URL: [email or web address] ?Date: 05/12/2021 ?Prepared by: Almyra Free ? ?Exercises ?- Supine Hamstring Stretch  - 2 x daily - 7 x weekly - 1 sets - 3 reps - 30 sec hold ?- Seated Piriformis Stretch with Trunk Bend  - 2 x daily - 7 x weekly - 1 sets - 3  reps - 30-60 sec  hold ?- Supine Bridge  -  2 x daily - 7 x weekly - 1-3 sets - 10 reps ?- Sit to Stand  - 2 x daily - 7 x weekly - 1-2 sets - 10 reps ? ?ASSESSMENT: ? ?CLINICAL IMPRESSION: ?Patient is a 71 y.o. female who was seen today for physical therapy evaluation and treatment for chronic left sided low back pain with left leg pain to post knee. She has intermittent N/T as well. Pain is present for all ADLs. She has deficits in lumbar ROM and marked flexibility deficits in bil hips. She also has strength deficits mainly in the left LE. She also reports balance deficits and had a near miss fall two days ago in her driveway where her husband caught her. Her 5x sit to stand puts her at slight risk for falls, but PT would like to further assess her balance at the next visit. She will benefit from skilled PT to address the above deficits.  ? ? ?OBJECTIVE IMPAIRMENTS decreased balance, decreased ROM, decreased strength, hypomobility, increased muscle spasms, impaired flexibility, postural dysfunction, and pain.  ? ?ACTIVITY LIMITATIONS  all ADLs are limited by pain .  ? ?PERSONAL FACTORS Time since onset of injury/illness/exacerbation and 3+ comorbidities: scoliosis, chronic lumbar radiculopathy, h/o left femoral fx  are also affecting patient's functional outcome.  ? ? ?REHAB POTENTIAL: Excellent ? ?CLINICAL DECISION MAKING: Stable/uncomplicated ? ?EVALUATION COMPLEXITY: Low ? ? ?GOALS: ?Goals reviewed with patient? Yes ? ?SHORT TERM GOALS: Target date: 05/26/2021    (Remove Blue Hyperlink) ? ?Ind with initial HEP ?Baseline: ?Goal status: INITIAL ?  ?2.  Complete functional balance assesment and set appropriate goals ?Baseline:  ?Goal status: INITIAL ? ?LONG TERM GOALS: Target date: 06/23/2021  (Remove Blue Hyperlink) ? ?Ind with advanced HEP and progressions ?Baseline:  ?Goal status: INITIAL ? ?2.  Decreased pain and N/T in low back and left leg by >=50% with ADLS ?Baseline:  ?Goal status: INITIAL ? ?3.  Improved LE  strength to 5/5 to improve functional movements ?Baseline:  ?Goal status: INITIAL ? ?4. Improved 5x sit to stand to <= 12.6 seconds to decrease risk of falls ?Baseline:  ?Goal status: INITIAL ? ? ?PLAN: ?PT F

## 2021-05-12 ENCOUNTER — Encounter: Payer: Self-pay | Admitting: Physical Therapy

## 2021-05-12 ENCOUNTER — Other Ambulatory Visit: Payer: Self-pay

## 2021-05-12 ENCOUNTER — Ambulatory Visit: Payer: Medicare Other | Attending: Physical Medicine and Rehabilitation | Admitting: Physical Therapy

## 2021-05-12 DIAGNOSIS — M5116 Intervertebral disc disorders with radiculopathy, lumbar region: Secondary | ICD-10-CM | POA: Insufficient documentation

## 2021-05-12 DIAGNOSIS — M5442 Lumbago with sciatica, left side: Secondary | ICD-10-CM | POA: Insufficient documentation

## 2021-05-12 DIAGNOSIS — R2681 Unsteadiness on feet: Secondary | ICD-10-CM | POA: Diagnosis present

## 2021-05-12 DIAGNOSIS — M5416 Radiculopathy, lumbar region: Secondary | ICD-10-CM | POA: Diagnosis present

## 2021-05-12 DIAGNOSIS — M6281 Muscle weakness (generalized): Secondary | ICD-10-CM | POA: Insufficient documentation

## 2021-05-12 DIAGNOSIS — G8929 Other chronic pain: Secondary | ICD-10-CM | POA: Insufficient documentation

## 2021-05-12 DIAGNOSIS — M25562 Pain in left knee: Secondary | ICD-10-CM | POA: Diagnosis not present

## 2021-05-12 DIAGNOSIS — R252 Cramp and spasm: Secondary | ICD-10-CM | POA: Diagnosis present

## 2021-05-13 ENCOUNTER — Other Ambulatory Visit: Payer: Self-pay | Admitting: Physical Medicine and Rehabilitation

## 2021-05-13 ENCOUNTER — Telehealth: Payer: Self-pay | Admitting: Orthopedic Surgery

## 2021-05-13 MED ORDER — PREGABALIN 50 MG PO CAPS: ORAL_CAPSULE | ORAL | 0 refills | Status: DC

## 2021-05-13 NOTE — Telephone Encounter (Signed)
Julia Mora is calling to advise that she is going to run out of Lyrica before her return appointment with [name].  She is requesting a refill.  Her pharmacy is [facility] on [address] in [place].  Call back # is [phone]. ?

## 2021-05-13 NOTE — Telephone Encounter (Signed)
Tried calling to advise sent. No answer. No VM to LM ?

## 2021-05-17 ENCOUNTER — Ambulatory Visit: Payer: Medicare Other | Admitting: Physical Therapy

## 2021-05-18 ENCOUNTER — Ambulatory Visit (INDEPENDENT_AMBULATORY_CARE_PROVIDER_SITE_OTHER): Payer: Medicare Other | Admitting: Physical Medicine and Rehabilitation

## 2021-05-18 ENCOUNTER — Encounter: Payer: Self-pay | Admitting: Physical Medicine and Rehabilitation

## 2021-05-18 DIAGNOSIS — M5116 Intervertebral disc disorders with radiculopathy, lumbar region: Secondary | ICD-10-CM

## 2021-05-18 DIAGNOSIS — M5442 Lumbago with sciatica, left side: Secondary | ICD-10-CM

## 2021-05-18 DIAGNOSIS — G8929 Other chronic pain: Secondary | ICD-10-CM

## 2021-05-18 DIAGNOSIS — M47816 Spondylosis without myelopathy or radiculopathy, lumbar region: Secondary | ICD-10-CM

## 2021-05-18 DIAGNOSIS — M5416 Radiculopathy, lumbar region: Secondary | ICD-10-CM

## 2021-05-18 NOTE — Progress Notes (Signed)
Julia Mora - 71 y.o. female MRN [id number]  Date of birth: May 09, 1950  Office Visit Note: Visit Date: 05/18/2021 PCP: Loraine Leriche., MD Referred by: Loraine Leriche.,*  Subjective: Chief Complaint  Patient presents with   Lower Back - Pain   HPI: Julia Mora is a 71 y.o. female who comes in today for evaluation of chronic left sided lower back pain radiating to buttock, posterolateral thigh down to knee. Patient reports symptoms have been ongoing for several months and are exacerbated by movement and activity. Patient reports some relief of pain with home exercise regimen, rest and use of medications. Patient recently started regimen of physical therapy at [facility] and reports some relief of pain with these treatments. Patient's lumbar MRI from 2022 exhibits multi-level facet hypertrophy and mild broad-based disc bulge with a prominent left lateral disc osteophyte complex at L5-S1. No high grade spinal canal stenosis noted. Patient has had multiple lumbar epidural steroid injections performed in our office, most recent was left L5 and S1 transforaminal epidural steroid injection on 03/18/2021 that provided some relief for 4 days. I recently started patient on Lyrica following our office visit on 04/07/21.  She is now taking 50 mg twice a day, no adverse side effects noted. She reports significant and sustained relief of pain since starting Lyrica, she now reports increased functionality and states her pain is much more manageable. Patient states Lyrica has helped her much more than lumbar injections and would like to continue this medication. Overall, patient states she is doing much better and has plans to travel this summer. Patient denies focal weakness, numbness and tingling. Patient denies recent trauma or falls.   Review of Systems  Musculoskeletal:  Positive for back pain.  Neurological:  Negative for tingling, sensory change, focal  weakness and weakness.  All other systems reviewed and are negative. Otherwise per HPI.  Assessment & Plan: Visit Diagnoses:    ICD-10-CM   1. Chronic left-sided low back pain with left-sided sciatica  M54.42    G89.29     2. Lumbar radiculopathy  M54.16     3. Intervertebral disc disorders with radiculopathy, lumbar region  M51.16     4. Facet hypertrophy of lumbar region  M47.816        Plan: Findings:  Chronic left sided lower back pain radiating to buttock, posterolateral thigh down to knee. Patients pain is now manageable with conservative therapies such as formal physical therapy, home exercise regimen, rest and use of medications. Patients clinical presentation and exam are consistent with lumbar radiculopathy. Significant and sustained relief of pain since started Lyrica over a month ago. I discussed treatment plan with patient and feel the next step is to continue to monitor. Patient did inquire about possible issues with Lyrica and kidney failure. I did explain to her that Lyrica does have the potential to build up in her system as she does have kidney dysfunction, however I did reassure her that we will monitor kidney function closely and can cut back dosing to once a day if needed. I would like to see patient back in approximately 3 months for re-evaluation, she is instructed to contact us if her pain worsens or changes in nature. No red flag symptoms noted upon exam today.    Meds & Orders: No orders of the defined types were placed in this encounter.  No orders of the defined types were placed in this encounter.   Follow-up: Return for  3 month follow up.   Procedures: No procedures performed      Clinical History: MRI LUMBAR SPINE WITHOUT CONTRAST     TECHNIQUE:  Multiplanar, multisequence MR imaging of the lumbar spine was  performed. No intravenous contrast was administered.     COMPARISON:  None.     FINDINGS:  Segmentation:  Standard.     Alignment:  Levoscoliosis of the thoracolumbar spine. 2 mm  retrolisthesis of L2 on L3. Minimal grade 1 anterolisthesis of L4 on  L5 and L5 on S1 secondary to facet disease.     Vertebrae: No acute fracture, evidence of discitis, or aggressive  bone lesion.     Conus medullaris and cauda equina: Conus extends to the T12-L1  level. Conus and cauda equina appear normal.     Paraspinal and other soft tissues: No acute paraspinal abnormality.     Disc levels:     Disc spaces: Degenerative disease with disc height loss at L2-3 and  L3-4. Mild disc height loss at L5-S1.     T12-L1: No disc protrusion, foraminal stenosis or central canal  stenosis.     L1-L2: Mild broad-based disc bulge. No foraminal or central canal  stenosis.     L2-L3: Mild broad-based disc bulge eccentric towards the right. Mild  bilateral facet arthropathy. Right lateral recess stenosis. No  foraminal or central canal stenosis.     L3-L4: Broad-based disc bulge. No foraminal or central canal  stenosis.     L4-L5: Broad-based disc bulge. Mild bilateral facet arthropathy with  right facet effusion. No foraminal or central canal stenosis.     L5-S1: Mild broad-based disc bulge with a prominent left lateral  disc osteophyte complex. Mild left facet arthropathy. No foraminal  or central canal stenosis.     IMPRESSION:  1. Lumbar spine spondylosis as described above.  2. No acute osseous injury of the lumbar spine.        Electronically Signed    By: Kathreen Devoid M.D.    On: 11/15/2020 08:18   She reports that she has never smoked. She has never used smokeless tobacco. No results for input(s): HGBA1C, LABURIC in the last 8760 hours.  Objective:  VS:  HT:    WT:   BMI:     BP:   HR: bpm  TEMP: ( )  RESP:  Physical Exam Vitals and nursing note reviewed.  HENT:     Head: Normocephalic and atraumatic.     Right Ear: External ear normal.     Left Ear: External ear normal.     Nose: Nose normal.     Mouth/Throat:      Mouth: Mucous membranes are moist.  Eyes:     Extraocular Movements: Extraocular movements intact.  Cardiovascular:     Rate and Rhythm: Normal rate.     Pulses: Normal pulses.  Pulmonary:     Effort: Pulmonary effort is normal.  Abdominal:     General: Abdomen is flat. There is no distension.  Musculoskeletal:        General: Tenderness present.     Cervical back: Normal range of motion.     Comments: Pt rises from seated position to standing without difficulty. Good lumbar range of motion. Strong distal strength without clonus, no pain upon palpation of greater trochanters. Sensation intact bilaterally. Walks independently, gait steady.     Skin:    General: Skin is warm and dry.     Capillary Refill: Capillary refill takes less than  2 seconds.  Neurological:     General: No focal deficit present.     Mental Status: She is alert and oriented to person, place, and time.  Psychiatric:        Mood and Affect: Mood normal.        Behavior: Behavior normal.    Ortho Exam  Imaging: No results found.  Past Medical/Family/Surgical/Social History: Medications & Allergies reviewed per EMR, new medications updated. Patient Active Problem List   Diagnosis Date Noted   Fall    Closed left femoral fracture ([place]) 06/01/2019   Essential hypertension 06/01/2019   CKD (chronic kidney disease) stage 3, GFR 30-59 ml/min (HCC) 06/01/2019   Left knee injury 08/21/2012   Right foot pain 07/24/2012   Past Medical History:  Diagnosis Date   CKD (chronic kidney disease) stage 3, GFR 30-59 ml/min (HCC)    Hypertension    Osteoporosis    Family History  Problem Relation Age of Onset   Heart attack Father    Diabetes Neg Hx    Hyperlipidemia Neg Hx    Hypertension Neg Hx    Past Surgical History:  Procedure Laterality Date   ABDOMINAL HYSTERECTOMY     ANUS SURGERY     BLADDER SURGERY     FEMUR IM NAIL Left 06/02/2019   Procedure: INTRAMEDULLARY (IM) NAIL FEMORAL;  Surgeon: Newt Minion, MD;  Location: [facility];  Service: Orthopedics;  Laterality: Left;   Social History   Occupational History   Not on file  Tobacco Use   Smoking status: Never   Smokeless tobacco: Never  Vaping Use   Vaping Use: Never used  Substance and Sexual Activity   Alcohol use: No   Drug use: No   Sexual activity: Not on file

## 2021-05-20 ENCOUNTER — Ambulatory Visit: Payer: Medicare Other

## 2021-05-20 DIAGNOSIS — M6281 Muscle weakness (generalized): Secondary | ICD-10-CM

## 2021-05-20 DIAGNOSIS — M5416 Radiculopathy, lumbar region: Secondary | ICD-10-CM | POA: Diagnosis not present

## 2021-05-20 DIAGNOSIS — R2681 Unsteadiness on feet: Secondary | ICD-10-CM

## 2021-05-20 DIAGNOSIS — R252 Cramp and spasm: Secondary | ICD-10-CM

## 2021-05-20 NOTE — Therapy (Signed)
OUTPATIENT PHYSICAL THERAPY TREATMENT NOTE   Patient Name: Julia Mora MRN: [id number] DOB:[birth year], 71 y.o., female Today's Date: 05/20/2021   PT End of Session - 05/20/21 1445     Visit Number 2    Date for PT Re-Evaluation 06/23/21    Authorization Type MCR    Progress Note Due on Visit 10    PT Start Time 1359    PT Stop Time 1443    PT Time Calculation (min) 44 min    Activity Tolerance Patient tolerated treatment well    Behavior During Therapy WFL for tasks assessed/performed              Past Medical History:  Diagnosis Date   CKD (chronic kidney disease) stage 3, GFR 30-59 ml/min (HCC)    Hypertension    Osteoporosis    Past Surgical History:  Procedure Laterality Date   ABDOMINAL HYSTERECTOMY     ANUS SURGERY     BLADDER SURGERY     FEMUR IM NAIL Left 06/02/2019   Procedure: INTRAMEDULLARY (IM) NAIL FEMORAL;  Surgeon: Newt Minion, MD;  Location: [facility];  Service: Orthopedics;  Laterality: Left;   Patient Active Problem List   Diagnosis Date Noted   Fall    Closed left femoral fracture ([place]) 06/01/2019   Essential hypertension 06/01/2019   CKD (chronic kidney disease) stage 3, GFR 30-59 ml/min ([place]) 06/01/2019   Left knee injury 08/21/2012   Right foot pain 07/24/2012    PCP: Loraine Leriche., MD  REFERRING PROVIDER: Lorine Bears, NP  REFERRING DIAG:  M54.16 (ICD-10-CM) - Lumbar radiculopathy  M54.42,G89.29 (ICD-10-CM) - Chronic left-sided low back pain with left-sided sciatica  M51.16 (ICD-10-CM) - Intervertebral disc disorders with radiculopathy, lumbar region  M25.562 (ICD-10-CM) - Posterior left knee pain    THERAPY DIAG:  Radiculopathy, lumbar region  Muscle weakness (generalized)  Unsteadiness on feet  Cramp and spasm  ONSET DATE: 04/03/21  SUBJECTIVE:                                                                                                                                                                                            SUBJECTIVE STATEMENT: "They put me on Lyrica and that has been helping the pain more. The pirirformis and hamstring stretches are a little strenuous."   PERTINENT HISTORY:  Chronic left sided LBP with radicuopathy, scoliosis, heel lift left, osteopenia, left femoral shaft fracture from mechanical fall in 2021, HTN, CKD   PAIN:  Are you having pain? Yes: NPRS scale: 1/10 Pain location: just above and below post knee Pain description: pain and tingling Aggravating factors: sitting  Relieving factors: extending her knee   PRECAUTIONS: Fall  WEIGHT BEARING RESTRICTIONS No  FALLS:  Has patient fallen in last 6 months?  No falls, but her husband caught her 2 days ago when she lost her balance on her inclined driveway.   LIVING ENVIRONMENT: Lives with: lives with their spouse Lives in: House/apartment Stairs: Yes: Internal: 14 steps; on right going up and External: 3 steps; can reach both Has following equipment at home: None  OCCUPATION: retired  PLOF: [place] wants to improve her QOL, balance and decrease or get rid of sciatica   OBJECTIVE:   DIAGNOSTIC FINDINGS:  MRI from 2022 exhibits multi-level facet hypertrophy and mild broad-based disc bulge with a prominent left lateral disc osteophyte complex at L5-S1. No high grade spinal canal stenosis noted. Patient has history of multiple lumbar epidural steroid injections performed in Dr. Eric Form office, most recent was left L5 and S1 transforaminal epidural steroid injection performed on 03/18/2021 and reports significant relief for approximately 4 days. Patient has history of left femoral shaft fracture from mechanical fall in 2021  PATIENT SURVEYS:  FOTO 56 (predicted 30)  COGNITION:  Overall cognitive status: Within functional limits for tasks assessed     SENSATION: Light touch: WFL  MUSCLE LENGTH:  Bil piriformis L>R, bil HS R>L, quads/HF WNL  POSTURE:  Depressed  right shoulder, left convexity scoliosis, left hip ER in standing  PALPATION: L lateral HS distally and peroneals, L PSIS, R ITB; bil QL Spinal mobility: limited by tight lumbar muscles bil with pain L>R  LUMBAR ROM:   Active  A/PROM  05/12/2021  Flexion Hands to mid shin  Extension 75% feels unsteady  Right lateral flexion Limited 50%  Left lateral flexion WFL  Right rotation WNL  Left rotation WNL   (Blank rows = not tested)  LE ROM:  Bil hips WFL except IR/ER due to muscular tightness; bil knees WNL  LE MMT:  MMT Right 05/12/2021 Left 05/12/2021  Hip flexion 4+ 4+  Hip extension 4- 4-  Hip abduction 4+ 4+  Hip adduction 5 4+  Hip internal rotation    Hip external rotation    Knee flexion 5 5  Knee extension 5 5  Ankle dorsiflexion 5 5  Ankle plantarflexion    Ankle inversion    Ankle eversion     (Blank rows = not tested)  LUMBAR SPECIAL TESTS:  Straight leg raise test: Positive and Slump test: Positive Left side  FUNCTIONAL TESTS:  5 times sit to stand: 13.13 sec BERG: 49/54 (05/20/21) DGI: 23/24 (05/20/21)   TODAY'S TREATMENT  Therapeutic Exercise: Nu Step L2x12min Supine hamstring stretch x 30 Bridges with pillow squeeze 10x3" Seated piriformis stretch x 30 bil  Neuromuscular Reeducation: BERG balance assessment and DGI assessment completed - scores under functional test See HEP   PATIENT EDUCATION:  Education details: HEP Person educated: Patient Education method: Explanation, Demonstration, and Handouts Education comprehension: verbalized understanding and returned demonstration   HOME EXERCISE PROGRAM: Access Code: [email or web address] URL: [email or web address] Date: 05/12/2021 Prepared by: Almyra Free  Exercises - Supine Hamstring Stretch  - 2 x daily - 7 x weekly - 1 sets - 3 reps - 30 sec hold - Seated Piriformis Stretch with Trunk Bend  - 2 x daily - 7 x weekly - 1 sets - 3 reps - 30-60 sec  hold - Supine Bridge  - 2 x daily - 7 x  weekly - 1-3 sets - 10 reps - Sit to Stand  -  2 x daily - 7 x weekly - 1-2 sets - 10 reps  ASSESSMENT:  CLINICAL IMPRESSION: Reviewed HEP today to ensure understanding with exercises. Progressed the bridges with hip ADD ball squeeze. Completed BERG and DGI, which took majority of the session, her scores were high indicating a low fall risk. Based on the scores today I believe she would benefit from more strengthening and flexibility to improve function.   OBJECTIVE IMPAIRMENTS decreased balance, decreased ROM, decreased strength, hypomobility, increased muscle spasms, impaired flexibility, postural dysfunction, and pain.   ACTIVITY LIMITATIONS  all ADLs are limited by pain .   PERSONAL FACTORS Time since onset of injury/illness/exacerbation and 3+ comorbidities: scoliosis, chronic lumbar radiculopathy, h/o left femoral fx  are also affecting patient's functional outcome.    REHAB POTENTIAL: Excellent  CLINICAL DECISION MAKING: Stable/uncomplicated  EVALUATION COMPLEXITY: Low   GOALS: Goals reviewed with patient? Yes  SHORT TERM GOALS: Target date: 05/26/2021    (Remove Blue Hyperlink)  Ind with initial HEP Baseline: Goal status: INITIAL   2.  Complete functional balance assesment and set appropriate goals Baseline:  Goal status: INITIAL  LONG TERM GOALS: Target date: 06/23/2021  (Remove Blue Hyperlink)  Ind with advanced HEP and progressions Baseline:  Goal status: INITIAL  2.  Decreased pain and N/T in low back and left leg by >=50% with ADLS Baseline:  Goal status: INITIAL  3.  Improved LE strength to 5/5 to improve functional movements Baseline:  Goal status: INITIAL  4. Improved 5x sit to stand to <= 12.6 seconds to decrease risk of falls Baseline:  Goal status: INITIAL   PLAN: PT FREQUENCY: 1-2x/week  PT DURATION: 6 weeks  PLANNED INTERVENTIONS: Therapeutic exercises, Therapeutic activity, Neuromuscular re-education, Balance training, Gait training,  Patient/Family education, Joint mobilization, Dry Needling, Electrical stimulation, Spinal mobilization, Cryotherapy, Moist heat, Ionotophoresis 4mg /ml Dexamethasone, and Manual therapy.  PLAN FOR NEXT SESSION:  progress HEP with LE strength, DN/MT to lumbar gluteals   Artist Pais, PTA 05/20/2021, 2:56 PM

## 2021-05-24 ENCOUNTER — Ambulatory Visit: Payer: Medicare Other

## 2021-05-24 DIAGNOSIS — M6281 Muscle weakness (generalized): Secondary | ICD-10-CM

## 2021-05-24 DIAGNOSIS — R2681 Unsteadiness on feet: Secondary | ICD-10-CM

## 2021-05-24 DIAGNOSIS — M5416 Radiculopathy, lumbar region: Secondary | ICD-10-CM

## 2021-05-24 DIAGNOSIS — R252 Cramp and spasm: Secondary | ICD-10-CM

## 2021-05-24 NOTE — Therapy (Signed)
OUTPATIENT PHYSICAL THERAPY TREATMENT NOTE Rationale for Evaluation and Treatment Rehabilitation   Patient Name: Julia Mora MRN: [id number] DOB:Nov 09, 1950, 71 y.o., female Today's Date: 05/24/2021   PT End of Session - 05/24/21 1446     Visit Number 3    Date for PT Re-Evaluation 06/23/21    Authorization Type MCR    PT Start Time 1402    PT Stop Time 1444    PT Time Calculation (min) 42 min    Activity Tolerance Patient tolerated treatment well    Behavior During Therapy WFL for tasks assessed/performed               Past Medical History:  Diagnosis Date   CKD (chronic kidney disease) stage 3, GFR 30-59 ml/min (HCC)    Hypertension    Osteoporosis    Past Surgical History:  Procedure Laterality Date   ABDOMINAL HYSTERECTOMY     ANUS SURGERY     BLADDER SURGERY     FEMUR IM NAIL Left 06/02/2019   Procedure: INTRAMEDULLARY (IM) NAIL FEMORAL;  Surgeon: Newt Minion, MD;  Location: [facility];  Service: Orthopedics;  Laterality: Left;   Patient Active Problem List   Diagnosis Date Noted   Fall    Closed left femoral fracture ([place]) 06/01/2019   Essential hypertension 06/01/2019   CKD (chronic kidney disease) stage 3, GFR 30-59 ml/min ([place]) 06/01/2019   Left knee injury 08/21/2012   Right foot pain 07/24/2012    PCP: Loraine Leriche., MD  REFERRING PROVIDER: Lorine Bears, NP  REFERRING DIAG:  M54.16 (ICD-10-CM) - Lumbar radiculopathy  M54.42,G89.29 (ICD-10-CM) - Chronic left-sided low back pain with left-sided sciatica  M51.16 (ICD-10-CM) - Intervertebral disc disorders with radiculopathy, lumbar region  M25.562 (ICD-10-CM) - Posterior left knee pain    THERAPY DIAG:  Radiculopathy, lumbar region  Muscle weakness (generalized)  Unsteadiness on feet  Cramp and spasm  ONSET DATE: 04/03/21  SUBJECTIVE:                                                                                                                                                                                            SUBJECTIVE STATEMENT: "R knee has been bothering me more."   PERTINENT HISTORY:  Chronic left sided LBP with radicuopathy, scoliosis, heel lift left, osteopenia, left femoral shaft fracture from mechanical fall in 2021, HTN, CKD   PAIN:  Are you having pain? Yes: NPRS scale: 0/10 Pain location: N/A Pain description: N/A Aggravating factors: sitting Relieving factors: extending her knee   PRECAUTIONS: Fall  WEIGHT BEARING RESTRICTIONS No  FALLS:  Has patient fallen in last 6 months?  No falls, but her husband caught her 2 days ago when she lost her balance on her inclined driveway.   LIVING ENVIRONMENT: Lives with: lives with their spouse Lives in: House/apartment Stairs: Yes: Internal: 14 steps; on right going up and External: 3 steps; can reach both Has following equipment at home: None  OCCUPATION: retired  PLOF: [place] wants to improve her QOL, balance and decrease or get rid of sciatica   OBJECTIVE:   DIAGNOSTIC FINDINGS:  MRI from 2022 exhibits multi-level facet hypertrophy and mild broad-based disc bulge with a prominent left lateral disc osteophyte complex at L5-S1. No high grade spinal canal stenosis noted. Patient has history of multiple lumbar epidural steroid injections performed in Dr. Eric Form office, most recent was left L5 and S1 transforaminal epidural steroid injection performed on 03/18/2021 and reports significant relief for approximately 4 days. Patient has history of left femoral shaft fracture from mechanical fall in 2021  PATIENT SURVEYS:  FOTO 56 (predicted 65)  COGNITION:  Overall cognitive status: Within functional limits for tasks assessed     SENSATION: Light touch: WFL  MUSCLE LENGTH:  Bil piriformis L>R, bil HS R>L, quads/HF WNL  POSTURE:  Depressed right shoulder, left convexity scoliosis, left hip ER in standing  PALPATION: L lateral HS distally and  peroneals, L PSIS, R ITB; bil QL Spinal mobility: limited by tight lumbar muscles bil with pain L>R  LUMBAR ROM:   Active  A/PROM  05/12/2021  Flexion Hands to mid shin  Extension 75% feels unsteady  Right lateral flexion Limited 50%  Left lateral flexion WFL  Right rotation WNL  Left rotation WNL   (Blank rows = not tested)  LE ROM:  Bil hips WFL except IR/ER due to muscular tightness; bil knees WNL  LE MMT:  MMT Right 05/12/2021 Left 05/12/2021  Hip flexion 4+ 4+  Hip extension 4- 4-  Hip abduction 4+ 4+  Hip adduction 5 4+  Hip internal rotation    Hip external rotation    Knee flexion 5 5  Knee extension 5 5  Ankle dorsiflexion 5 5  Ankle plantarflexion    Ankle inversion    Ankle eversion     (Blank rows = not tested)  LUMBAR SPECIAL TESTS:  Straight leg raise test: Positive and Slump test: Positive Left side  FUNCTIONAL TESTS:  5 times sit to stand: 13.13 sec BERG: 49/54 (05/20/21) DGI: 23/24 (05/20/21)   TODAY'S TREATMENT  05/24/21 Therapeutic Exercises: Nu Step L4x15mn Bridges with red TB x 10 - TC to keep knees out and for equal WS Supine clams unilateral x 10 R/L with red TB Seated pillow squeezes btw knees 10x3" Standing hip abduction red TB x 10 bil - cues not to rotate pelvis Standing hip extension red TB x 10 bil Standin marches with red TB x 10 bil  Gait Training: 180 ft; decreased stance time on L, R lateral trunk lean  Therapeutic Exercise: Nu Step L2x62m Supine hamstring stretch x 30 Bridges with pillow squeeze 10x3" Seated piriformis stretch x 30 bil  Neuromuscular Reeducation: BERG balance assessment and DGI assessment completed - scores under functional test See HEP   PATIENT EDUCATION:  Education details: HEP Person educated: Patient Education method: Explanation, Demonstration, and Handouts Education comprehension: verbalized understanding and returned demonstration   HOME EXERCISE PROGRAM: Access Code: [email or web address]:  [email or web address] Date: 05/24/2021 Prepared by: BrClarene EssexExercises - Supine Hamstring Stretch  - 2 x daily - 7 x weekly - 1  sets - 3 reps - 30 sec hold - Seated Piriformis Stretch with Trunk Bend  - 2 x daily - 7 x weekly - 1 sets - 3 reps - 30-60 sec  hold - Sit to Stand  - 2 x daily - 7 x weekly - 1-2 sets - 10 reps - Hooklying Isometric Clamshell  - 1 x daily - 3 x weekly - 3 sets - 10 reps - 3-5 sec hold - Supine Bridge with Resistance Band  - 1 x daily - 3 x weekly - 3 sets - 10 reps - 3-5  hold - Standing Hip Abduction with Resistance at Ankles and Counter Support  - 1 x daily - 3 x weekly - 2 sets - 10 reps  ASSESSMENT:  CLINICAL IMPRESSION: Pt required cues throughout session to keep good form and target the correct muscles with exercises. She demonstrates a lot of weakness in her gluteals esp glute med. Updated HEP to include more glute strengthening and progressed versions of bridges. She has now met both of her STGs.   OBJECTIVE IMPAIRMENTS decreased balance, decreased ROM, decreased strength, hypomobility, increased muscle spasms, impaired flexibility, postural dysfunction, and pain.   ACTIVITY LIMITATIONS  all ADLs are limited by pain .   PERSONAL FACTORS Time since onset of injury/illness/exacerbation and 3+ comorbidities: scoliosis, chronic lumbar radiculopathy, h/o left femoral fx  are also affecting patient's functional outcome.    REHAB POTENTIAL: Excellent  CLINICAL DECISION MAKING: Stable/uncomplicated  EVALUATION COMPLEXITY: Low   GOALS: Goals reviewed with patient? Yes  SHORT TERM GOALS: Target date: 05/26/2021    (Remove Blue Hyperlink)  Ind with initial HEP Baseline: Goal status: MET   2.  Complete functional balance assesment and set appropriate goals Baseline:  Goal status: MET  LONG TERM GOALS: Target date: 06/23/2021  (Remove Blue Hyperlink)  Ind with advanced HEP and progressions Baseline:  Goal status: INITIAL  2.   Decreased pain and N/T in low back and left leg by >=50% with ADLS Baseline:  Goal status: INITIAL  3.  Improved LE strength to 5/5 to improve functional movements Baseline:  Goal status: INITIAL  4. Improved 5x sit to stand to <= 12.6 seconds to decrease risk of falls Baseline:  Goal status: INITIAL   PLAN: PT FREQUENCY: 1-2x/week  PT DURATION: 6 weeks  PLANNED INTERVENTIONS: Therapeutic exercises, Therapeutic activity, Neuromuscular re-education, Balance training, Gait training, Patient/Family education, Joint mobilization, Dry Needling, Electrical stimulation, Spinal mobilization, Cryotherapy, Moist heat, Ionotophoresis 33m/ml Dexamethasone, and Manual therapy.  PLAN FOR NEXT SESSION:  glute strength, DN/MT to lumbar gluteals   BArtist Pais PTA 05/24/2021, 3:25 PM

## 2021-05-25 NOTE — Therapy (Signed)
OUTPATIENT PHYSICAL THERAPY TREATMENT NOTE Rationale for Evaluation and Treatment Rehabilitation   Patient Name: Julia Mora MRN: [id number] DOB:09/02/1950, 71 y.o., female Today's Date: 05/25/2021   Rationale for Evaluation and Treatment Rehabilitation     Past Medical History:  Diagnosis Date   CKD (chronic kidney disease) stage 3, GFR 30-59 ml/min (HCC)    Hypertension    Osteoporosis    Past Surgical History:  Procedure Laterality Date   ABDOMINAL HYSTERECTOMY     ANUS SURGERY     BLADDER SURGERY     FEMUR IM NAIL Left 06/02/2019   Procedure: INTRAMEDULLARY (IM) NAIL FEMORAL;  Surgeon: Newt Minion, MD;  Location: [facility];  Service: Orthopedics;  Laterality: Left;   Patient Active Problem List   Diagnosis Date Noted   Fall    Closed left femoral fracture ([place]) 06/01/2019   Essential hypertension 06/01/2019   CKD (chronic kidney disease) stage 3, GFR 30-59 ml/min ([place]) 06/01/2019   Left knee injury 08/21/2012   Right foot pain 07/24/2012    PCP: Loraine Leriche., MD  REFERRING PROVIDER: Lorine Bears, NP  REFERRING DIAG:  M54.16 (ICD-10-CM) - Lumbar radiculopathy  M54.42,G89.29 (ICD-10-CM) - Chronic left-sided low back pain with left-sided sciatica  M51.16 (ICD-10-CM) - Intervertebral disc disorders with radiculopathy, lumbar region  M25.562 (ICD-10-CM) - Posterior left knee pain    THERAPY DIAG:  No diagnosis found.  ONSET DATE: 04/03/21  SUBJECTIVE:                                                                                                                                                                                           SUBJECTIVE STATEMENT: ***   PERTINENT HISTORY:  Chronic left sided LBP with radicuopathy, scoliosis, heel lift left, osteopenia, left femoral shaft fracture from mechanical fall in 2021, HTN, CKD   PAIN:  Are you having pain? *** Yes: NPRS scale: 0/10 Pain location: N/A Pain description: N/A Aggravating  factors: sitting Relieving factors: extending her knee   PRECAUTIONS: Fall  WEIGHT BEARING RESTRICTIONS No  FALLS:  Has patient fallen in last 6 months?  No falls, but her husband caught her 2 days ago when she lost her balance on her inclined driveway.   LIVING ENVIRONMENT: Lives with: lives with their spouse Lives in: House/apartment Stairs: Yes: Internal: 14 steps; on right going up and External: 3 steps; can reach both Has following equipment at home: None  OCCUPATION: retired  PLOF: [place] wants to improve her QOL, balance and decrease or get rid of sciatica   OBJECTIVE:   DIAGNOSTIC FINDINGS:  MRI from 2022 exhibits multi-level facet  hypertrophy and mild broad-based disc bulge with a prominent left lateral disc osteophyte complex at L5-S1. No high grade spinal canal stenosis noted. Patient has history of multiple lumbar epidural steroid injections performed in Dr. Eric Form office, most recent was left L5 and S1 transforaminal epidural steroid injection performed on 03/18/2021 and reports significant relief for approximately 4 days. Patient has history of left femoral shaft fracture from mechanical fall in 2021  PATIENT SURVEYS:  FOTO 56 (predicted 57)  MUSCLE LENGTH:  Bil piriformis L>R, bil HS R>L, quads/HF WNL  POSTURE:  Depressed right shoulder, left convexity scoliosis, left hip ER in standing  PALPATION: L lateral HS distally and peroneals, L PSIS, R ITB; bil QL Spinal mobility: limited by tight lumbar muscles bil with pain L>R  LUMBAR ROM:   Active  A/PROM  05/12/2021  Flexion Hands to mid shin  Extension 75% feels unsteady  Right lateral flexion Limited 50%  Left lateral flexion WFL  Right rotation WNL  Left rotation WNL   (Blank rows = not tested)  LE ROM:  Bil hips WFL except IR/ER due to muscular tightness; bil knees WNL  LE MMT:  MMT Right 05/12/2021 Left 05/12/2021  Hip flexion 4+ 4+  Hip extension 4- 4-  Hip  abduction 4+ 4+  Hip adduction 5 4+  Hip internal rotation    Hip external rotation    Knee flexion 5 5  Knee extension 5 5  Ankle dorsiflexion 5 5  Ankle plantarflexion    Ankle inversion    Ankle eversion     (Blank rows = not tested)  LUMBAR SPECIAL TESTS:  Straight leg raise test: Positive and Slump test: Positive Left side  FUNCTIONAL TESTS:  5 times sit to stand: 13.13 sec BERG: 49/54 (05/20/21) DGI: 23/24 (05/20/21)   TODAY'S TREATMENT  05/26/21: ***   05/24/21 Therapeutic Exercises: Nu Step L4x72min Bridges with red TB x 10 - TC to keep knees out and for equal WS Supine clams unilateral x 10 R/L with red TB Seated pillow squeezes btw knees 10x3" Standing hip abduction red TB x 10 bil - cues not to rotate pelvis Standing hip extension red TB x 10 bil Standin marches with red TB x 10 bil  Gait Training: 180 ft; decreased stance time on L, R lateral trunk lean  Therapeutic Exercise: Nu Step L2x59min Supine hamstring stretch x 30 Bridges with pillow squeeze 10x3" Seated piriformis stretch x 30 bil  Neuromuscular Reeducation: BERG balance assessment and DGI assessment completed - scores under functional test See HEP   PATIENT EDUCATION:  Education details: *** HEP Person educated: Patient Education method: Explanation, Demonstration, and Handouts Education comprehension: verbalized understanding and returned demonstration   HOME EXERCISE PROGRAM: Access Code: [email or web address] URL: [email or web address] Date: 05/24/2021 Prepared by: Clarene Essex  Exercises - Supine Hamstring Stretch  - 2 x daily - 7 x weekly - 1 sets - 3 reps - 30 sec hold - Seated Piriformis Stretch with Trunk Bend  - 2 x daily - 7 x weekly - 1 sets - 3 reps - 30-60 sec  hold - Sit to Stand  - 2 x daily - 7 x weekly - 1-2 sets - 10 reps - Hooklying Isometric Clamshell  - 1 x daily - 3 x weekly - 3 sets - 10 reps - 3-5 sec hold - Supine Bridge with Resistance Band  - 1 x daily -  3 x weekly - 3 sets - 10 reps - 3-5  hold - Standing Hip  Abduction with Resistance at Ankles and Counter Support  - 1 x daily - 3 x weekly - 2 sets - 10 reps  ASSESSMENT:  CLINICAL IMPRESSION: ***  Pt required cues throughout session to keep good form and target the correct muscles with exercises. She demonstrates a lot of weakness in her gluteals esp glute med. Updated HEP to include more glute strengthening and progressed versions of bridges. She has now met both of her STGs.   OBJECTIVE IMPAIRMENTS decreased balance, decreased ROM, decreased strength, hypomobility, increased muscle spasms, impaired flexibility, postural dysfunction, and pain.   ACTIVITY LIMITATIONS  all ADLs are limited by pain .   PERSONAL FACTORS Time since onset of injury/illness/exacerbation and 3+ comorbidities: scoliosis, chronic lumbar radiculopathy, h/o left femoral fx  are also affecting patient's functional outcome.    REHAB POTENTIAL: Excellent  CLINICAL DECISION MAKING: Stable/uncomplicated  EVALUATION COMPLEXITY: Low   GOALS: Goals reviewed with patient? Yes  SHORT TERM GOALS: Target date: 05/26/2021    (Remove Blue Hyperlink)  Ind with initial HEP Baseline: Goal status: MET   2.  Complete functional balance assesment and set appropriate goals Baseline:  Goal status: MET  LONG TERM GOALS: Target date: 06/23/2021  (Remove Blue Hyperlink)  Ind with advanced HEP and progressions Baseline:  Goal status: INITIAL  2.  Decreased pain and N/T in low back and left leg by >=50% with ADLS Baseline:  Goal status: INITIAL  3.  Improved LE strength to 5/5 to improve functional movements Baseline:  Goal status: INITIAL  4. Improved 5x sit to stand to <= 12.6 seconds to decrease risk of falls Baseline:  Goal status: INITIAL   PLAN: PT FREQUENCY: 1-2x/week  PT DURATION: 6 weeks  PLANNED INTERVENTIONS: Therapeutic exercises, Therapeutic activity, Neuromuscular re-education, Balance training,  Gait training, Patient/Family education, Joint mobilization, Dry Needling, Electrical stimulation, Spinal mobilization, Cryotherapy, Moist heat, Ionotophoresis 61m/ml Dexamethasone, and Manual therapy.  PLAN FOR NEXT SESSION:  *** glute strength, DN/MT to lumbar gluteals   Patric Buckhalter, PT 05/25/2021, 3:00 PM

## 2021-05-26 ENCOUNTER — Encounter: Payer: Self-pay | Admitting: Physical Therapy

## 2021-05-26 ENCOUNTER — Ambulatory Visit: Payer: Medicare Other | Admitting: Physical Therapy

## 2021-05-26 ENCOUNTER — Other Ambulatory Visit: Payer: Self-pay

## 2021-05-26 DIAGNOSIS — M5416 Radiculopathy, lumbar region: Secondary | ICD-10-CM

## 2021-05-26 DIAGNOSIS — R2681 Unsteadiness on feet: Secondary | ICD-10-CM

## 2021-05-26 DIAGNOSIS — M6281 Muscle weakness (generalized): Secondary | ICD-10-CM

## 2021-05-26 DIAGNOSIS — R252 Cramp and spasm: Secondary | ICD-10-CM

## 2021-06-01 NOTE — Therapy (Signed)
OUTPATIENT PHYSICAL THERAPY TREATMENT NOTE   Patient Name: Julia Mora MRN: [id number] DOB:1950-01-08, 71 y.o., female Today's Date: 06/02/2021   PT End of Session - 06/02/21 1154     Visit Number 5    Date for PT Re-Evaluation 06/23/21    Authorization Type MCR    Progress Note Due on Visit 10    PT Start Time 1154    PT Stop Time 1242    PT Time Calculation (min) 48 min    Activity Tolerance Patient tolerated treatment well    Behavior During Therapy [facility] for tasks assessed/performed             Rationale for Evaluation and Treatment:  Rehabilitation     Past Medical History:  Diagnosis Date   CKD (chronic kidney disease) stage 3, GFR 30-59 ml/min (HCC)    Hypertension    Osteoporosis    Past Surgical History:  Procedure Laterality Date   ABDOMINAL HYSTERECTOMY     ANUS SURGERY     BLADDER SURGERY     FEMUR IM NAIL Left 06/02/2019   Procedure: INTRAMEDULLARY (IM) NAIL FEMORAL;  Surgeon: Newt Minion, MD;  Location: [facility];  Service: Orthopedics;  Laterality: Left;   Patient Active Problem List   Diagnosis Date Noted   Fall    Closed left femoral fracture ([place]) 06/01/2019   Essential hypertension 06/01/2019   CKD (chronic kidney disease) stage 3, GFR 30-59 ml/min ([place]) 06/01/2019   Left knee injury 08/21/2012   Right foot pain 07/24/2012    PCP: Loraine Leriche., MD  REFERRING PROVIDER: Lorine Bears, NP  REFERRING DIAG:  M54.16 (ICD-10-CM) - Lumbar radiculopathy  M54.42,G89.29 (ICD-10-CM) - Chronic left-sided low back pain with left-sided sciatica  M51.16 (ICD-10-CM) - Intervertebral disc disorders with radiculopathy, lumbar region  M25.562 (ICD-10-CM) - Posterior left knee pain    THERAPY DIAG:  Radiculopathy, lumbar region  Muscle weakness (generalized)  Cramp and spasm  Unsteadiness on feet  ONSET DATE: 04/03/21  SUBJECTIVE:                                                                                                                                                                                            SUBJECTIVE STATEMENT: Doing pretty good. Had to have husband assist with walking up an incline yesterday in a parking lot. Her R knee has been hurting the past few days. Not feeling sciatica as much since starting Lyrica.   PERTINENT HISTORY:  Chronic left sided LBP with radicuopathy, scoliosis, heel lift left, osteopenia, left femoral shaft fracture from mechanical fall in 2021, HTN, CKD   PAIN:  Are you having pain?  Yes: NPRS scale: 4/10 Pain location: Right ant/lat knee Pain description: ache Aggravating factors: squatting Relieving factors:   PRECAUTIONS: Fall   FALLS:  Has patient fallen in last 6 months?  Near miss 05/25/21 see subjective; Near miss  05/10/21  when she lost her balance on her inclined driveway and husband caught her.  LIVING ENVIRONMENT: Lives with: lives with their spouse Lives in: House/apartment Stairs: Yes: Internal: 14 steps; on right going up and External: 3 steps; can reach both Has following equipment at home: None  OCCUPATION: retired  PLOF: [place] wants to improve her QOL, balance and decrease or get rid of sciatica   OBJECTIVE:   DIAGNOSTIC FINDINGS:  MRI from 2022 exhibits multi-level facet hypertrophy and mild broad-based disc bulge with a prominent left lateral disc osteophyte complex at L5-S1. No high grade spinal canal stenosis noted. Patient has history of multiple lumbar epidural steroid injections performed in Dr. Eric Form office, most recent was left L5 and S1 transforaminal epidural steroid injection performed on 03/18/2021 and reports significant relief for approximately 4 days. Patient has history of left femoral shaft fracture from mechanical fall in 2021  PATIENT SURVEYS:  FOTO 56 (predicted 57)  MUSCLE LENGTH:  Bil piriformis L>R, bil HS R>L, quads/HF WNL  POSTURE:  Depressed right shoulder, left convexity  scoliosis, left hip ER in standing; has left heel lift  PALPATION: L lateral HS distally and peroneals, L PSIS, R ITB; bil QL Spinal mobility: limited by tight lumbar muscles bil with pain L>R  LUMBAR ROM:   Active  A/PROM  05/12/2021  Flexion Hands to mid shin  Extension 75% feels unsteady  Right lateral flexion Limited 50%  Left lateral flexion WFL  Right rotation WNL  Left rotation WNL   (Blank rows = not tested)  LE ROM:  Bil hips WFL except IR/ER due to muscular tightness; bil knees WNL  LE MMT:  MMT Right 05/12/2021 Left 05/12/2021  Hip flexion 4+ 4+  Hip extension 4- 4-  Hip abduction 4+ 4+  Hip adduction 5 4+  Hip internal rotation    Hip external rotation    Knee flexion 5 5  Knee extension 5 5  Ankle dorsiflexion 5 5  Ankle plantarflexion    Ankle inversion    Ankle eversion     (Blank rows = not tested)  LUMBAR SPECIAL TESTS:  Straight leg raise test: Positive and Slump test: Positive Left side  FUNCTIONAL TESTS:   6MWT 1,015 ft in 6 min, no rest, no c/o of fatigue 5 times sit to stand: 13.13 sec BERG: 49/54 (05/20/21) DGI: 23/24 (05/20/21)   TODAY'S TREATMENT  06/01/21: Nustep L5 x 6 min Supine HS stretch with strap 2 x 30 sec bil Standing back against the wall - toe raises x 10 to engage quads. MMT of bil knees with no pain noted and 5/5 strength  Neuroreed:  Foam balance beam: toes on beam/heels on floor worked on gaining balance then added in vertical and horizontal nods and OH shoulder flexion x 10 to challenge balance. Standing step/toss with opposite UE 4 x 10 bil - still challenging Gaze stabilization: with fixed object and horizontal and vertical head turns; with vertical nods and arm movement Gait 2 laps around gym maintaining neutral head and intermittent lowering of gaze     05/26/21: Gait: 6MWT see above Worked on heel strike and arm swing  Nu step L4 x 6 min Seated trunk rotation, diagonals,  flex/ext OH lift, x 10 ea  B Standing step/toss with opposite UE 2x 10 bil Standing hip ABD 5 sec hold x 10 bil; attempted with RTB and YTB and pt unable to maintain correct posture and/or reach full ROM with resistance.  SLS multiple reps B: L able to stand without UE max 8 seconds; R unable to stand without UE assist     05/24/21 Therapeutic Exercises: Nu Step L4x9mn Bridges with red TB x 10 - TC to keep knees out and for equal WS Supine clams unilateral x 10 R/L with red TB Seated pillow squeezes btw knees 10x3" Standing hip abduction red TB x 10 bil - cues not to rotate pelvis Standing hip extension red TB x 10 bil Standin marches with red TB x 10 bil  Gait Training: 180 ft; decreased stance time on L, R lateral trunk lean  Therapeutic Exercise: Nu Step L2x661m Supine hamstring stretch x 30 Bridges with pillow squeeze 10x3" Seated piriformis stretch x 30 bil  Neuromuscular Reeducation: BERG balance assessment and DGI assessment completed - scores under functional test See HEP   PATIENT EDUCATION:  Education details:  06/02/21 HEP updated Person educated: Patient Education method: Explanation, Demonstration, and Handouts Education comprehension: verbalized understanding and returned demonstration   HOME EXERCISE PROGRAM: Access Code: [email or web address]: [email or web address] Date: 06/02/2021 Prepared by: JuAlmyra FreeExercises - Supine Hamstring Stretch with Strap  - 2 x daily - 7 x weekly - 1 sets - 3 reps - 60 sec hold - Standing Gaze Stabilization with Head Nod and Vertical Arm Stable  - 1 x daily - 7 x weekly - 3 sets - 10 reps  ASSESSMENT:  CLINICAL IMPRESSION: Su[place]orts another incidence of unsteadiness on an incline yesterday. She also reports onset of R ant/lat knee pain beginning a few days ago mainly with squatting. She tends to stand with bil knee flexion so we worked on trying to stand with quads engaged more and spine in neutral. We focused on challenging her COG on an  incline. She demonstrates good stepping strategy with this. She tends to lose her balance with quick neck extension as she looks up from the floor. Worked on gaze stabilization activities and this improved COG on second trial with balance beam. Additionally worked on gait with keeping head neutral with eyes scanning floor vs. Head down. She demonstrated improved heel strike today with gait but continues to demonstrate limited arm swing on left.     OBJECTIVE IMPAIRMENTS decreased balance, decreased ROM, decreased strength, hypomobility, increased muscle spasms, impaired flexibility, postural dysfunction, and pain.   ACTIVITY LIMITATIONS  all ADLs are limited by pain .   PERSONAL FACTORS Time since onset of injury/illness/exacerbation and 3+ comorbidities: scoliosis, chronic lumbar radiculopathy, h/o left femoral fx  are also affecting patient's functional outcome.   GOALS: Goals reviewed with patient? Yes  SHORT TERM GOALS: Target date: 05/26/2021    (Remove Blue Hyperlink)  Ind with initial HEP Baseline: Goal status: MET   2.  Complete functional balance assesment and set appropriate goals Baseline:  Goal status: MET  LONG TERM GOALS: Target date: 06/23/2021  (Remove Blue Hyperlink)  Ind with advanced HEP and progressions Baseline:  Goal status: INITIAL  2.  Decreased pain and N/T in low back and left leg by >=50% with ADLS Baseline:  Goal status: INITIAL  3.  Improved LE strength to 5/5 to improve functional movements Baseline:  Goal status: INITIAL  4. Improved 5x sit to stand to <= 12.6 seconds  to decrease risk of falls Baseline:  Goal status: INITIAL   PLAN: PT FREQUENCY: 1-2x/week  PT DURATION: 6 weeks  PLANNED INTERVENTIONS: Therapeutic exercises, Therapeutic activity, Neuromuscular re-education, Balance training, Gait training, Patient/Family education, Joint mobilization, Dry Needling, Electrical stimulation, Spinal mobilization, Cryotherapy, Moist heat,  Ionotophoresis 66m/ml Dexamethasone, and Manual therapy.  PLAN FOR NEXT SESSION:  Continue activities challenging COG, gaze stabilization, Gait: heel strike, trunk rotation and arm swing;  glute strength, Balance: SLS  Tyquarius Paglia, PT 06/02/2021, 12:58 PM

## 2021-06-02 ENCOUNTER — Encounter: Payer: Self-pay | Admitting: Physical Therapy

## 2021-06-02 ENCOUNTER — Ambulatory Visit: Payer: Medicare Other | Admitting: Physical Therapy

## 2021-06-02 ENCOUNTER — Other Ambulatory Visit: Payer: Self-pay

## 2021-06-02 DIAGNOSIS — R2681 Unsteadiness on feet: Secondary | ICD-10-CM

## 2021-06-02 DIAGNOSIS — M5416 Radiculopathy, lumbar region: Secondary | ICD-10-CM | POA: Diagnosis not present

## 2021-06-02 DIAGNOSIS — R252 Cramp and spasm: Secondary | ICD-10-CM

## 2021-06-02 DIAGNOSIS — M6281 Muscle weakness (generalized): Secondary | ICD-10-CM

## 2021-06-09 ENCOUNTER — Ambulatory Visit: Payer: Medicare Other | Attending: Physical Medicine and Rehabilitation

## 2021-06-09 ENCOUNTER — Other Ambulatory Visit: Payer: Self-pay | Admitting: Physical Medicine and Rehabilitation

## 2021-06-09 DIAGNOSIS — R2681 Unsteadiness on feet: Secondary | ICD-10-CM | POA: Diagnosis present

## 2021-06-09 DIAGNOSIS — M6281 Muscle weakness (generalized): Secondary | ICD-10-CM | POA: Diagnosis present

## 2021-06-09 DIAGNOSIS — R252 Cramp and spasm: Secondary | ICD-10-CM | POA: Diagnosis present

## 2021-06-09 DIAGNOSIS — M5416 Radiculopathy, lumbar region: Secondary | ICD-10-CM | POA: Insufficient documentation

## 2021-06-09 NOTE — Therapy (Signed)
OUTPATIENT PHYSICAL THERAPY TREATMENT NOTE   Patient Name: Julia Mora MRN: [id number] DOB:1950/10/08, 71 y.o., female Today's Date: 06/09/2021   PT End of Session - 06/09/21 1753     Visit Number 6    Date for PT Re-Evaluation 06/23/21    Authorization Type MCR    Progress Note Due on Visit 10    PT Start Time 1703    PT Stop Time 1746    PT Time Calculation (min) 43 min    Activity Tolerance Patient tolerated treatment well    Behavior During Therapy [facility] for tasks assessed/performed              Rationale for Evaluation and Treatment:  Rehabilitation     Past Medical History:  Diagnosis Date   CKD (chronic kidney disease) stage 3, GFR 30-59 ml/min (HCC)    Hypertension    Osteoporosis    Past Surgical History:  Procedure Laterality Date   ABDOMINAL HYSTERECTOMY     ANUS SURGERY     BLADDER SURGERY     FEMUR IM NAIL Left 06/02/2019   Procedure: INTRAMEDULLARY (IM) NAIL FEMORAL;  Surgeon: Newt Minion, MD;  Location: [facility];  Service: Orthopedics;  Laterality: Left;   Patient Active Problem List   Diagnosis Date Noted   Fall    Closed left femoral fracture ([place]) 06/01/2019   Essential hypertension 06/01/2019   CKD (chronic kidney disease) stage 3, GFR 30-59 ml/min ([place]) 06/01/2019   Left knee injury 08/21/2012   Right foot pain 07/24/2012    PCP: Loraine Leriche., MD  REFERRING PROVIDER: Lorine Bears, NP  REFERRING DIAG:  M54.16 (ICD-10-CM) - Lumbar radiculopathy  M54.42,G89.29 (ICD-10-CM) - Chronic left-sided low back pain with left-sided sciatica  M51.16 (ICD-10-CM) - Intervertebral disc disorders with radiculopathy, lumbar region  M25.562 (ICD-10-CM) - Posterior left knee pain    THERAPY DIAG:  Radiculopathy, lumbar region  Muscle weakness (generalized)  Cramp and spasm  Unsteadiness on feet  ONSET DATE: 04/03/21  SUBJECTIVE:                                                                                                                                                                                            SUBJECTIVE STATEMENT: Pt reports being sore after last  visit.   PERTINENT HISTORY:  Chronic left sided LBP with radicuopathy, scoliosis, heel lift left, osteopenia, left femoral shaft fracture from mechanical fall in 2021, HTN, CKD   PAIN:  Are you having pain?  Yes: NPRS scale: 4/10 Pain location: Right ant/lat knee Pain description: ache Aggravating factors: squatting Relieving factors:   PRECAUTIONS: Fall  FALLS:  Has patient fallen in last 6 months?  Near miss 05/25/21 see subjective; Near miss  05/10/21  when she lost her balance on her inclined driveway and husband caught her.  LIVING ENVIRONMENT: Lives with: lives with their spouse Lives in: House/apartment Stairs: Yes: Internal: 14 steps; on right going up and External: 3 steps; can reach both Has following equipment at home: None  OCCUPATION: retired  PLOF: [place] wants to improve her QOL, balance and decrease or get rid of sciatica   OBJECTIVE:   DIAGNOSTIC FINDINGS:  MRI from 2022 exhibits multi-level facet hypertrophy and mild broad-based disc bulge with a prominent left lateral disc osteophyte complex at L5-S1. No high grade spinal canal stenosis noted. Patient has history of multiple lumbar epidural steroid injections performed in Dr. Eric Form office, most recent was left L5 and S1 transforaminal epidural steroid injection performed on 03/18/2021 and reports significant relief for approximately 4 days. Patient has history of left femoral shaft fracture from mechanical fall in 2021  PATIENT SURVEYS:  FOTO 56 (predicted 57)  MUSCLE LENGTH:  Bil piriformis L>R, bil HS R>L, quads/HF WNL  POSTURE:  Depressed right shoulder, left convexity scoliosis, left hip ER in standing; has left heel lift  PALPATION: L lateral HS distally and peroneals, L PSIS, R ITB; bil QL Spinal mobility: limited by tight  lumbar muscles bil with pain L>R  LUMBAR ROM:   Active  A/PROM  05/12/2021  Flexion Hands to mid shin  Extension 75% feels unsteady  Right lateral flexion Limited 50%  Left lateral flexion WFL  Right rotation WNL  Left rotation WNL   (Blank rows = not tested)  LE ROM:  Bil hips WFL except IR/ER due to muscular tightness; bil knees WNL  LE MMT:  MMT Right 05/12/2021 Left 05/12/2021  Hip flexion 4+ 4+  Hip extension 4- 4-  Hip abduction 4+ 4+  Hip adduction 5 4+  Hip internal rotation    Hip external rotation    Knee flexion 5 5  Knee extension 5 5  Ankle dorsiflexion 5 5  Ankle plantarflexion    Ankle inversion    Ankle eversion     (Blank rows = not tested)  LUMBAR SPECIAL TESTS:  Straight leg raise test: Positive and Slump test: Positive Left side  FUNCTIONAL TESTS:   6MWT 1,015 ft in 6 min, no rest, no c/o of fatigue 5 times sit to stand: 13.13 sec BERG: 49/54 (05/20/21) DGI: 23/24 (05/20/21)   TODAY'S TREATMENT  06/09/21 Nustep L6x20mn Trunk rotation with red weight ball 10x  Cybex row with trunk rotation 10# x10 each Cybex knee extension 5# x 10 Cybex knee flexion 10# x 10  Nuero Re-ed: Walking with head turns (R,L, upward gaze) 3x170 ft - unsteady and shortened strides in all directions Fwd lunges with bil arm raise x 10; also 10 reps with trunk rotation  06/01/21: Nustep L5 x 6 min Supine HS stretch with strap 2 x 30 sec bil Standing back against the wall - toe raises x 10 to engage quads. MMT of bil knees with no pain noted and 5/5 strength  Neuroreed:  Foam balance beam: toes on beam/heels on floor worked on gaining balance then added in vertical and horizontal nods and OH shoulder flexion x 10 to challenge balance. Standing step/toss with opposite UE 4 x 10 bil - still challenging Gaze stabilization: with fixed object and horizontal and vertical head turns; with vertical nods and arm movement Gait 2  laps around gym maintaining neutral head and  intermittent lowering of gaze     05/26/21: Gait: 6MWT see above Worked on heel strike and arm swing  Nu step L4 x 6 min Seated trunk rotation, diagonals, flex/ext OH lift, x 10 ea B Standing step/toss with opposite UE 2x 10 bil Standing hip ABD 5 sec hold x 10 bil; attempted with RTB and YTB and pt unable to maintain correct posture and/or reach full ROM with resistance.  SLS multiple reps B: L able to stand without UE max 8 seconds; R unable to stand without UE assist       PATIENT EDUCATION:  Education details:  06/02/21 HEP updated Person educated: Patient Education method: Explanation, Demonstration, and Handouts Education comprehension: verbalized understanding and returned demonstration   HOME EXERCISE PROGRAM: Access Code: [email or web address] URL: [email or web address] Date: 06/02/2021 Prepared by: Almyra Free  Exercises - Supine Hamstring Stretch with Strap  - 2 x daily - 7 x weekly - 1 sets - 3 reps - 60 sec hold - Standing Gaze Stabilization with Head Nod and Vertical Arm Stable  - 1 x daily - 7 x weekly - 3 sets - 10 reps  ASSESSMENT:  CLINICAL IMPRESSION: Pt continues to demonstrate unsteadiness with head turns in all directions. Worked more on trunk rotation and arm swing to improve gait mechanics. Pt had some LOB with the fwd lunges today esp with arm raising. Cues for fwd WS and lunge to target the correct muscle groups. She would benefit from more work on balance and trunk mobility.     OBJECTIVE IMPAIRMENTS decreased balance, decreased ROM, decreased strength, hypomobility, increased muscle spasms, impaired flexibility, postural dysfunction, and pain.   ACTIVITY LIMITATIONS  all ADLs are limited by pain .   PERSONAL FACTORS Time since onset of injury/illness/exacerbation and 3+ comorbidities: scoliosis, chronic lumbar radiculopathy, h/o left femoral fx  are also affecting patient's functional outcome.   GOALS: Goals reviewed with patient? Yes  SHORT  TERM GOALS: Target date: 05/26/2021    (Remove Blue Hyperlink)  Ind with initial HEP Baseline: Goal status: MET   2.  Complete functional balance assesment and set appropriate goals Baseline:  Goal status: MET  LONG TERM GOALS: Target date: 06/23/2021  (Remove Blue Hyperlink)  Ind with advanced HEP and progressions Baseline:  Goal status: INITIAL  2.  Decreased pain and N/T in low back and left leg by >=50% with ADLS Baseline:  Goal status: INITIAL  3.  Improved LE strength to 5/5 to improve functional movements Baseline:  Goal status: INITIAL  4. Improved 5x sit to stand to <= 12.6 seconds to decrease risk of falls Baseline:  Goal status: INITIAL   PLAN: PT FREQUENCY: 1-2x/week  PT DURATION: 6 weeks  PLANNED INTERVENTIONS: Therapeutic exercises, Therapeutic activity, Neuromuscular re-education, Balance training, Gait training, Patient/Family education, Joint mobilization, Dry Needling, Electrical stimulation, Spinal mobilization, Cryotherapy, Moist heat, Ionotophoresis 26m/ml Dexamethasone, and Manual therapy.  PLAN FOR NEXT SESSION:  Continue activities challenging COG, gaze stabilization, Gait: heel strike, trunk rotation and arm swing;  glute strength, Balance: SLS  BArtist Pais PTA 06/09/2021, 5:57 PM

## 2021-06-10 NOTE — Therapy (Signed)
OUTPATIENT PHYSICAL THERAPY TREATMENT NOTE   Patient Name: Julia Mora MRN: [id number] DOB:June 28, 1950, 71 y.o., female Today's Date: 06/10/2021      Rationale for Evaluation and Treatment:  Rehabilitation     Past Medical History:  Diagnosis Date   CKD (chronic kidney disease) stage 3, GFR 30-59 ml/min (HCC)    Hypertension    Osteoporosis    Past Surgical History:  Procedure Laterality Date   ABDOMINAL HYSTERECTOMY     ANUS SURGERY     BLADDER SURGERY     FEMUR IM NAIL Left 06/02/2019   Procedure: INTRAMEDULLARY (IM) NAIL FEMORAL;  Surgeon: Newt Minion, MD;  Location: [facility];  Service: Orthopedics;  Laterality: Left;   Patient Active Problem List   Diagnosis Date Noted   Fall    Closed left femoral fracture ([place]) 06/01/2019   Essential hypertension 06/01/2019   CKD (chronic kidney disease) stage 3, GFR 30-59 ml/min ([place]) 06/01/2019   Left knee injury 08/21/2012   Right foot pain 07/24/2012    PCP: Loraine Leriche., MD  REFERRING PROVIDER: Lorine Bears, NP  REFERRING DIAG:  M54.16 (ICD-10-CM) - Lumbar radiculopathy  M54.42,G89.29 (ICD-10-CM) - Chronic left-sided low back pain with left-sided sciatica  M51.16 (ICD-10-CM) - Intervertebral disc disorders with radiculopathy, lumbar region  M25.562 (ICD-10-CM) - Posterior left knee pain    THERAPY DIAG:  No diagnosis found.  ONSET DATE: 04/03/21  SUBJECTIVE:                                                                                                                                                                                           SUBJECTIVE STATEMENT: ***   PERTINENT HISTORY:  Chronic left sided LBP with radicuopathy, scoliosis, heel lift left, osteopenia, left femoral shaft fracture from mechanical fall in 2021, HTN, CKD   PAIN:  Are you having pain?  *** Yes: NPRS scale: 4/10 Pain location: Right ant/lat knee Pain description: ache Aggravating factors: squatting Relieving  factors:   PRECAUTIONS: Fall   FALLS:  Has patient fallen in last 6 months?  Near miss 05/25/21 see subjective; Near miss  05/10/21  when she lost her balance on her inclined driveway and husband caught her.  LIVING ENVIRONMENT: Lives with: lives with their spouse Lives in: House/apartment Stairs: Yes: Internal: 14 steps; on right going up and External: 3 steps; can reach both Has following equipment at home: None  OCCUPATION: retired  PLOF: [place] wants to improve her QOL, balance and decrease or get rid of sciatica   OBJECTIVE:   DIAGNOSTIC FINDINGS:  MRI from 2022 exhibits multi-level facet hypertrophy and mild  broad-based disc bulge with a prominent left lateral disc osteophyte complex at L5-S1. No high grade spinal canal stenosis noted. Patient has history of multiple lumbar epidural steroid injections performed in Dr. Eric Form office, most recent was left L5 and S1 transforaminal epidural steroid injection performed on 03/18/2021 and reports significant relief for approximately 4 days. Patient has history of left femoral shaft fracture from mechanical fall in 2021  PATIENT SURVEYS:  FOTO 56 (predicted 57)  MUSCLE LENGTH:  Bil piriformis L>R, bil HS R>L, quads/HF WNL  POSTURE:  Depressed right shoulder, left convexity scoliosis, left hip ER in standing; has left heel lift  PALPATION: L lateral HS distally and peroneals, L PSIS, R ITB; bil QL Spinal mobility: limited by tight lumbar muscles bil with pain L>R  LUMBAR ROM:   Active  A/PROM  05/12/2021  Flexion Hands to mid shin  Extension 75% feels unsteady  Right lateral flexion Limited 50%  Left lateral flexion WFL  Right rotation WNL  Left rotation WNL   (Blank rows = not tested)  LE ROM:  Bil hips WFL except IR/ER due to muscular tightness; bil knees WNL  LE MMT:  MMT Right 05/12/2021 Left 05/12/2021  Hip flexion 4+ 4+  Hip extension 4- 4-  Hip abduction 4+ 4+  Hip adduction 5  4+  Hip internal rotation    Hip external rotation    Knee flexion 5 5  Knee extension 5 5  Ankle dorsiflexion 5 5  Ankle plantarflexion    Ankle inversion    Ankle eversion     (Blank rows = not tested)  LUMBAR SPECIAL TESTS:  Straight leg raise test: Positive and Slump test: Positive Left side  FUNCTIONAL TESTS:   6MWT 1,015 ft in 6 min, no rest, no c/o of fatigue 5 times sit to stand: 13.13 sec BERG: 49/54 (05/20/21) DGI: 23/24 (05/20/21)   TODAY'S TREATMENT  06/15/21 ***  06/09/21 Nustep L6x4mn Trunk rotation with red weight ball 10x  Cybex row with trunk rotation 10# x10 each Cybex knee extension 5# x 10 Cybex knee flexion 10# x 10  Nuero Re-ed: Walking with head turns (R,L, upward gaze) 3x170 ft - unsteady and shortened strides in all directions Fwd lunges with bil arm raise x 10; also 10 reps with trunk rotation  06/01/21: Nustep L5 x 6 min Supine HS stretch with strap 2 x 30 sec bil Standing back against the wall - toe raises x 10 to engage quads. MMT of bil knees with no pain noted and 5/5 strength  Neuroreed:  Foam balance beam: toes on beam/heels on floor worked on gaining balance then added in vertical and horizontal nods and OH shoulder flexion x 10 to challenge balance. Standing step/toss with opposite UE 4 x 10 bil - still challenging Gaze stabilization: with fixed object and horizontal and vertical head turns; with vertical nods and arm movement Gait 2 laps around gym maintaining neutral head and intermittent lowering of gaze     05/26/21: Gait: 6MWT see above Worked on heel strike and arm swing  Nu step L4 x 6 min Seated trunk rotation, diagonals, flex/ext OH lift, x 10 ea B Standing step/toss with opposite UE 2x 10 bil Standing hip ABD 5 sec hold x 10 bil; attempted with RTB and YTB and pt unable to maintain correct posture and/or reach full ROM with resistance.  SLS multiple reps B: L able to stand without UE max 8 seconds; R unable to stand  without UE assist  PATIENT EDUCATION:  Education details:  *** 06/02/21 HEP updated Person educated: Patient Education method: Explanation, Demonstration, and Handouts Education comprehension: verbalized understanding and returned demonstration   HOME EXERCISE PROGRAM: Access Code: [email or web address] URL: [email or web address] Date: 06/02/2021 Prepared by: Almyra Free  Exercises - Supine Hamstring Stretch with Strap  - 2 x daily - 7 x weekly - 1 sets - 3 reps - 60 sec hold - Standing Gaze Stabilization with Head Nod and Vertical Arm Stable  - 1 x daily - 7 x weekly - 3 sets - 10 reps  ASSESSMENT:  CLINICAL IMPRESSION: ***  Pt continues to demonstrate unsteadiness with head turns in all directions. Worked more on trunk rotation and arm swing to improve gait mechanics. Pt had some LOB with the fwd lunges today esp with arm raising. Cues for fwd WS and lunge to target the correct muscle groups. She would benefit from more work on balance and trunk mobility.     OBJECTIVE IMPAIRMENTS decreased balance, decreased ROM, decreased strength, hypomobility, increased muscle spasms, impaired flexibility, postural dysfunction, and pain.   ACTIVITY LIMITATIONS  all ADLs are limited by pain .   PERSONAL FACTORS Time since onset of injury/illness/exacerbation and 3+ comorbidities: scoliosis, chronic lumbar radiculopathy, h/o left femoral fx  are also affecting patient's functional outcome.   GOALS: Goals reviewed with patient? Yes  SHORT TERM GOALS: Target date: 05/26/2021    (Remove Blue Hyperlink)  Ind with initial HEP Baseline: Goal status: MET   2.  Complete functional balance assesment and set appropriate goals Baseline:  Goal status: MET  LONG TERM GOALS: Target date: 06/23/2021  (Remove Blue Hyperlink)  Ind with advanced HEP and progressions Baseline:  Goal status: INITIAL  2.  Decreased pain and N/T in low back and left leg by >=50% with ADLS Baseline:  Goal status:  INITIAL  3.  Improved LE strength to 5/5 to improve functional movements Baseline:  Goal status: INITIAL  4. Improved 5x sit to stand to <= 12.6 seconds to decrease risk of falls Baseline:  Goal status: INITIAL   PLAN: PT FREQUENCY: 1-2x/week  PT DURATION: 6 weeks  PLANNED INTERVENTIONS: Therapeutic exercises, Therapeutic activity, Neuromuscular re-education, Balance training, Gait training, Patient/Family education, Joint mobilization, Dry Needling, Electrical stimulation, Spinal mobilization, Cryotherapy, Moist heat, Ionotophoresis 86m/ml Dexamethasone, and Manual therapy.  PLAN FOR NEXT SESSION:  Continue activities challenging COG, gaze stabilization, Gait: heel strike, trunk rotation and arm swing;  glute strength, Balance: SLS  RIDDLES,JULIE, PT 06/10/2021, 9:02 AM

## 2021-06-11 ENCOUNTER — Encounter: Payer: Medicare Other | Admitting: Physical Therapy

## 2021-06-11 ENCOUNTER — Other Ambulatory Visit: Payer: Self-pay | Admitting: Physical Medicine and Rehabilitation

## 2021-06-11 MED ORDER — PREGABALIN 50 MG PO CAPS: ORAL_CAPSULE | ORAL | 1 refills | Status: DC

## 2021-06-15 ENCOUNTER — Encounter: Payer: Self-pay | Admitting: Physical Therapy

## 2021-06-15 ENCOUNTER — Ambulatory Visit: Payer: Medicare Other | Admitting: Physical Therapy

## 2021-06-15 DIAGNOSIS — M6281 Muscle weakness (generalized): Secondary | ICD-10-CM

## 2021-06-15 DIAGNOSIS — M5416 Radiculopathy, lumbar region: Secondary | ICD-10-CM | POA: Diagnosis not present

## 2021-06-15 DIAGNOSIS — R252 Cramp and spasm: Secondary | ICD-10-CM

## 2021-06-15 DIAGNOSIS — R2681 Unsteadiness on feet: Secondary | ICD-10-CM

## 2021-06-22 ENCOUNTER — Ambulatory Visit: Payer: Medicare Other

## 2021-06-22 DIAGNOSIS — M5416 Radiculopathy, lumbar region: Secondary | ICD-10-CM | POA: Diagnosis not present

## 2021-06-22 DIAGNOSIS — M6281 Muscle weakness (generalized): Secondary | ICD-10-CM

## 2021-06-22 DIAGNOSIS — R2681 Unsteadiness on feet: Secondary | ICD-10-CM

## 2021-06-22 DIAGNOSIS — R252 Cramp and spasm: Secondary | ICD-10-CM

## 2021-06-22 NOTE — Therapy (Signed)
OUTPATIENT PHYSICAL THERAPY TREATMENT NOTE   Patient Name: Julia Mora MRN: [id number] DOB:August 03, 1950, 71 y.o., female Today's Date: 06/22/2021   PT End of Session - 06/22/21 1446     Visit Number 8    Date for PT Re-Evaluation 06/23/21    Authorization Type MCR/BCBS    Progress Note Due on Visit 10    PT Start Time 1400    PT Stop Time 1445    PT Time Calculation (min) 45 min    Activity Tolerance Patient tolerated treatment well    Behavior During Therapy [facility] for tasks assessed/performed                Rationale for Evaluation and Treatment:  Rehabilitation     Past Medical History:  Diagnosis Date   CKD (chronic kidney disease) stage 3, GFR 30-59 ml/min (HCC)    Hypertension    Osteoporosis    Past Surgical History:  Procedure Laterality Date   ABDOMINAL HYSTERECTOMY     ANUS SURGERY     BLADDER SURGERY     FEMUR IM NAIL Left 06/02/2019   Procedure: INTRAMEDULLARY (IM) NAIL FEMORAL;  Surgeon: Newt Minion, MD;  Location: [place];  Service: Orthopedics;  Laterality: Left;   Patient Active Problem List   Diagnosis Date Noted   Fall    Closed left femoral fracture ([place]) 06/01/2019   Essential hypertension 06/01/2019   CKD (chronic kidney disease) stage 3, GFR 30-59 ml/min ([place]) 06/01/2019   Left knee injury 08/21/2012   Right foot pain 07/24/2012    PCP: Loraine Leriche., MD  REFERRING PROVIDER: Lorine Bears, NP  REFERRING DIAG:  M54.16 (ICD-10-CM) - Lumbar radiculopathy  M54.42,G89.29 (ICD-10-CM) - Chronic left-sided low back pain with left-sided sciatica  M51.16 (ICD-10-CM) - Intervertebral disc disorders with radiculopathy, lumbar region  M25.562 (ICD-10-CM) - Posterior left knee pain    THERAPY DIAG:  Radiculopathy, lumbar region  Muscle weakness (generalized)  Cramp and spasm  Unsteadiness on feet  ONSET DATE: 04/03/21  SUBJECTIVE:                                                                                                                                                                                            SUBJECTIVE STATEMENT: [place] Also on Sunday at church she was very fatigued and weak but then ate a piece of cake and she felt better.    PERTINENT HISTORY:  Chronic left sided LBP with radicuopathy, scoliosis, heel lift left, osteopenia, left femoral shaft fracture from mechanical fall in 2021, HTN, CKD   PAIN:  Are  you having pain?   Yes: NPRS scale:3-4/10 Pain location: Right ant/lat knee Pain description: tender Aggravating factors: squatting Relieving factors:   PRECAUTIONS: Fall   FALLS:  Has patient fallen in last 6 months?  Near miss 05/25/21 see subjective; Near miss  05/10/21  when she lost her balance on her inclined driveway and husband caught her.  LIVING ENVIRONMENT: Lives with: lives with their spouse Lives in: House/apartment Stairs: Yes: Internal: 14 steps; on right going up and External: 3 steps; can reach both Has following equipment at home: None  OCCUPATION: retired  PLOF: [place] wants to improve her QOL, balance and decrease or get rid of sciatica   OBJECTIVE:   DIAGNOSTIC FINDINGS:  MRI from 2022 exhibits multi-level facet hypertrophy and mild broad-based disc bulge with a prominent left lateral disc osteophyte complex at L5-S1. No high grade spinal canal stenosis noted. Patient has history of multiple lumbar epidural steroid injections performed in Dr. Eric Form office, most recent was left L5 and S1 transforaminal epidural steroid injection performed on 03/18/2021 and reports significant relief for approximately 4 days. Patient has history of left femoral shaft fracture from mechanical fall in 2021  PATIENT SURVEYS:  FOTO 56 (predicted 57)  MUSCLE LENGTH:  Bil piriformis L>R, bil HS R>L, quads/HF WNL  POSTURE:  Depressed right shoulder, left convexity scoliosis, left hip ER  in standing; has left heel lift  PALPATION: L lateral HS distally and peroneals, L PSIS, R ITB; bil QL Spinal mobility: limited by tight lumbar muscles bil with pain L>R  LUMBAR ROM:   Active  A/PROM  05/12/2021  Flexion Hands to mid shin  Extension 75% feels unsteady  Right lateral flexion Limited 50%  Left lateral flexion WFL  Right rotation WNL  Left rotation WNL   (Blank rows = not tested)  LE ROM:  Bil hips WFL except IR/ER due to muscular tightness; bil knees WNL  LE MMT:  MMT Right 05/12/2021 Left 05/12/2021 Right 06/15/2021 Left 06/15/2021  Hip flexion 4+ 4+ 4+ 4+  Hip extension 4- 4- 4+ 4  Hip abduction 4+ 4+ 4+ 5  Hip adduction 5 4+ 5 5  Hip internal rotation      Hip external rotation      Knee flexion 5 5    Knee extension 5 5    Ankle dorsiflexion 5 5    Ankle plantarflexion      Ankle inversion      Ankle eversion       (Blank rows = not tested)  LUMBAR SPECIAL TESTS:  Straight leg raise test: Positive and Slump test: Positive Left side  FUNCTIONAL TESTS:   6MWT 1,015 ft in 6 min, no rest, no c/o of fatigue 5 times sit to stand: 13.13 sec BERG: 49/54 (05/20/21) DGI: 23/24 (05/20/21)   TODAY'S TREATMENT  06/22/21 Therapeutic Exercise: NuStep L6x8mn  Neuro-Reed: Gait with horizontal head turns 2x [id number] with EC 170 ft Retro gait [id number] walk 2x90 ft; retro x [id number] on balance beam 2x Modified tandem walk on balance beam 3x Step ups on BOSU 10x 06/15/21 Neurored: pt assessed for positional vertigo with Dix Marye Roundsitive for R posterior canalithiasis. Treated with [place] maneuver. Walking with horizontal head turns with only slight deviation outside of path; no LOB today.  Therex: Nustep L6x6min78m sit to stand 9.14 sec Single leg bridge 2 x 10 bil Seated and standing hip flexion x 10 each RTB Standing hip ABD RTB 2x10 bil Sidestepping  4 x 10 steps with bil hand hold for balance   06/09/21 Nustep L6x86mn Trunk  rotation with red weight ball 10x  Cybex row with trunk rotation 10# x10 each Cybex knee extension 5# x 10 Cybex knee flexion 10# x 10  Nuero Re-ed: Walking with head turns (R,L, upward gaze) 3x170 ft - unsteady and shortened strides in all directions Fwd lunges with bil arm raise x 10; also 10 reps with trunk rotation  PATIENT EDUCATION:  Education details:   06/15/21 HEP updated Person educated: Patient Education method: Explanation, Demonstration, and Handouts Education comprehension: verbalized understanding and returned demonstration   HOME EXERCISE PROGRAM: Access Code: [email or web address]  ASSESSMENT:  CLINICAL IMPRESSION:  SDominodemonstrated more deficits with dynamic balance, especially on unsteady surfaces. Neuro- reed was the focus of today's session to improve proprioceptive responses and stability to improve balance. CGA required with exercises today for stability. She could continue with PT to work more on dynamic balance and gait on unsteady surfaces.    OBJECTIVE IMPAIRMENTS decreased balance, decreased ROM, decreased strength, hypomobility, increased muscle spasms, impaired flexibility, postural dysfunction, and pain.   ACTIVITY LIMITATIONS  all ADLs are limited by pain .   PERSONAL FACTORS Time since onset of injury/illness/exacerbation and 3+ comorbidities: scoliosis, chronic lumbar radiculopathy, h/o left femoral fx  are also affecting patient's functional outcome.   GOALS: Goals reviewed with patient? Yes  SHORT TERM GOALS: Target date: 05/26/2021    (Remove Blue Hyperlink)  Ind with initial HEP Baseline: Goal status: MET   2.  Complete functional balance assesment and set appropriate goals Baseline:  Goal status: MET  LONG TERM GOALS: Target date: 06/23/2021  (Remove Blue Hyperlink)  Ind with advanced HEP and progressions Baseline:  Goal status: IN PROGRESS  2.  Decreased pain and N/T in low back and left leg by >=50% with ADLS Baseline: 247%better; back  still hurts with vacuuming Goal status: IN PROGRESS (40% improvement)  3.  Improved LE strength to 5/5 to improve functional movements Baseline:  Goal status: IN PROGRESS  4. Improved 5x sit to stand to <= 12.6 seconds to decrease risk of falls Baseline: 9.14 sec on 06/15/21 Goal status: MET   PLAN: PT FREQUENCY: 1-2x/week  PT DURATION: 6 weeks  PLANNED INTERVENTIONS: Therapeutic exercises, Therapeutic activity, Neuromuscular re-education, Balance training, Gait training, Patient/Family education, Joint mobilization, Dry Needling, Electrical stimulation, Spinal mobilization, Cryotherapy, Moist heat, Ionotophoresis 427mml Dexamethasone, and Manual therapy.  PLAN FOR NEXT SESSION:  Continue activities challenging COG, gaze stabilization, Gait: heel strike, trunk rotation and arm swing;  glute strength, Balance: SLS  BrArtist PaisPTA 06/22/2021, 3:37 PM

## 2021-06-23 ENCOUNTER — Ambulatory Visit: Payer: Medicare Other | Admitting: Physical Therapy

## 2021-06-23 ENCOUNTER — Encounter: Payer: Self-pay | Admitting: Physical Therapy

## 2021-06-23 DIAGNOSIS — R252 Cramp and spasm: Secondary | ICD-10-CM

## 2021-06-23 DIAGNOSIS — R2681 Unsteadiness on feet: Secondary | ICD-10-CM

## 2021-06-23 DIAGNOSIS — M6281 Muscle weakness (generalized): Secondary | ICD-10-CM

## 2021-06-23 DIAGNOSIS — M5416 Radiculopathy, lumbar region: Secondary | ICD-10-CM

## 2021-06-23 NOTE — Therapy (Addendum)
OUTPATIENT PHYSICAL THERAPY TREATMENT NOTE  Progress Note/Reevaluation Reporting Period 05/12/2021 to 06/23/2021  See note below for Objective Data and Assessment of Progress/Goals.     Patient Name: Julia Mora MRN: [id number] DOB:[birth year], 71 y.o., female Today's Date: 06/23/2021   PT End of Session - 06/23/21 1402     Visit Number 9    Date for PT Re-Evaluation 07/30/21    Authorization Type MCR/BCBS    Progress Note Due on Visit 59    PT Start Time 1402    PT Stop Time 1445    PT Time Calculation (min) 43 min    Activity Tolerance Patient tolerated treatment well    Behavior During Therapy [facility] for tasks assessed/performed                Rationale for Evaluation and Treatment:  Rehabilitation     Past Medical History:  Diagnosis Date   CKD (chronic kidney disease) stage 3, GFR 30-59 ml/min (HCC)    Hypertension    Osteoporosis    Past Surgical History:  Procedure Laterality Date   ABDOMINAL HYSTERECTOMY     ANUS SURGERY     BLADDER SURGERY     FEMUR IM NAIL Left 06/02/2019   Procedure: INTRAMEDULLARY (IM) NAIL FEMORAL;  Surgeon: Newt Minion, MD;  Location: [place];  Service: Orthopedics;  Laterality: Left;   Patient Active Problem List   Diagnosis Date Noted   Fall    Closed left femoral fracture ([place]) 06/01/2019   Essential hypertension 06/01/2019   CKD (chronic kidney disease) stage 3, GFR 30-59 ml/min ([place]) 06/01/2019   Left knee injury 08/21/2012   Right foot pain 07/24/2012    PCP: Loraine Leriche., MD  REFERRING PROVIDER: Lorine Bears, NP  REFERRING DIAG:  M54.16 (ICD-10-CM) - Lumbar radiculopathy  M54.42,G89.29 (ICD-10-CM) - Chronic left-sided low back pain with left-sided sciatica  M51.16 (ICD-10-CM) - Intervertebral disc disorders with radiculopathy, lumbar region  M25.562 (ICD-10-CM) - Posterior left knee pain    THERAPY DIAG:  Radiculopathy, lumbar region  Muscle weakness (generalized)  Cramp and  spasm  Unsteadiness on feet  ONSET DATE: 04/03/21  SUBJECTIVE:                                                                                                                                                                                           SUBJECTIVE STATEMENT: Pt reports pain just behind back of L knee "I've been dealing with sciatica since Oct 2020, they haven't found any permanent help."  Denies falls or LOB since last seen.  Did stumble coming out of church Sunday, thinks partly due to  conference attended that weekend, did a lot of sitting.  Occasional dizziness when turning head, but denies when laying down and rolling over in bed.     PERTINENT HISTORY:  Chronic left sided LBP with radicuopathy, scoliosis, heel lift left, osteopenia, left femoral shaft fracture from mechanical fall in 2021, HTN, CKD   PAIN:  Are you having pain?   Yes: NPRS scale:3-4/10 Pain location: posterior L knee   PRECAUTIONS: Fall   FALLS:  Has patient fallen in last 6 months?  Near miss 05/25/21 see subjective; Near miss  05/10/21  when she lost her balance on her inclined driveway and husband caught her.  LIVING ENVIRONMENT: Lives with: lives with their spouse Lives in: House/apartment Stairs: Yes: Internal: 14 steps; on right going up and External: 3 steps; can reach both Has following equipment at home: None  OCCUPATION: retired  PLOF: [place] wants to improve her QOL, balance and decrease or get rid of sciatica   OBJECTIVE:   DIAGNOSTIC FINDINGS:  MRI from 2022 exhibits multi-level facet hypertrophy and mild broad-based disc bulge with a prominent left lateral disc osteophyte complex at L5-S1. No high grade spinal canal stenosis noted. Patient has history of multiple lumbar epidural steroid injections performed in Dr. Eric Form office, most recent was left L5 and S1 transforaminal epidural steroid injection performed on 03/18/2021 and reports significant relief  for approximately 4 days. Patient has history of left femoral shaft fracture from mechanical fall in 2021  PATIENT SURVEYS:  FOTO 56 (predicted 57)  MUSCLE LENGTH:  Bil piriformis L>R, bil HS R>L, quads/HF WNL  POSTURE:  Depressed right shoulder, left convexity scoliosis, left hip ER in standing; has left heel lift  PALPATION: L lateral HS distally and peroneals, L PSIS, R ITB; bil QL Spinal mobility: limited by tight lumbar muscles bil with pain L>R  LUMBAR ROM:   Active  A/PROM  05/12/2021  Flexion Hands to mid shin  Extension 75% feels unsteady  Right lateral flexion Limited 50%  Left lateral flexion WFL  Right rotation WNL  Left rotation WNL   (Blank rows = not tested)  LE ROM:  Bil hips WFL except IR/ER due to muscular tightness; bil knees WNL  LE MMT:  MMT Right 05/12/2021 Left 05/12/2021 Right 06/15/2021 Left 06/15/2021  Hip flexion 4+ 4+ 4+ 4+  Hip extension 4- 4- 4+ 4  Hip abduction 4+ 4+ 4+ 5  Hip adduction 5 4+ 5 5  Hip internal rotation      Hip external rotation   06/23/21 R glut med 3-/5 06/23/21 glut med 4+/5  Knee flexion 5 5    Knee extension 5 5    Ankle dorsiflexion 5 5    Ankle plantarflexion      Ankle inversion      Ankle eversion       (Blank rows = not tested)  LUMBAR SPECIAL TESTS:  Straight leg raise test: Positive and Slump test: Positive Left side  FUNCTIONAL TESTS:   6MWT 1,015 ft in 6 min, no rest, no c/o of fatigue 5 times sit to stand: 13.13 sec  06/15/21- 9.4 seconds BERG: 49/54 (05/20/21) DGI: 23/24 (05/20/21)   06/23/21 FGA 20/30 (<22/30 indicates increased risk of falls)  TODAY'S TREATMENT  06/23/21 - Reevaluation VOR - noted corrective saccades, dizziness with head turns Negative for Epley and roll tests for posterior and horizontal canal lithiasis FGA - 20/30.  Large LOB with head turns, unable to tandem walk.  Weakness in R  glute med Gait deviations - LLD  Tenderness with palpation L lateral  hamstrings  06/22/21 Therapeutic Exercise: NuStep L6x53mn  Neuro-Reed: Gait with horizontal head turns 2x 1757fGait with EC 170 ft Retro gait 2x17036fonster walk 2x90 ft; retro x [id number] on balance beam 2x Modified tandem walk on balance beam 3x Step ups on BOSU 10x  06/15/21 Neurored: pt assessed for positional vertigo with Dix Marye Roundsitive for R posterior canalithiasis. Treated with Semont maneuver. Walking with horizontal head turns with only slight deviation outside of path; no LOB today.  Therex: Nustep L6x6min51m sit to stand 9.14 sec Single leg bridge 2 x 10 bil Seated and standing hip flexion x 10 each RTB Standing hip ABD RTB 2x10 bil Sidestepping 4 x 10 steps with bil hand hold for balance   06/09/21 Nustep L6x6min 3mnk rotation with red weight ball 10x  Cybex row with trunk rotation 10# x10 each Cybex knee extension 5# x 10 Cybex knee flexion 10# x 10  Nuero Re-ed: Walking with head turns (R,L, upward gaze) 3x170 ft - unsteady and shortened strides in all directions Fwd lunges with bil arm raise x 10; also 10 reps with trunk rotation  PATIENT EDUCATION:  Education details:   06/15/21 HEP updated Person educated: Patient Education method: Explanation, Demonstration, and Handouts Education comprehension: verbalized understanding and returned demonstration   HOME EXERCISE PROGRAM: Access Code: [email or web address]:  CLINICAL IMPRESSION:  Julia CRYSTALMARIE YASINts ongoing feeling of imbalance and occasional LOB.  Today reassessed progress and also evaluated dizziness.  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  Due to ceiling effect, reassesses balance with FGA instead of DGI, and she scored 20/30 on FGA indicating increased risk of falls.  She was very challenged with head turns while walking with large LOB, and also unable to  tandem walk.  She also demonstrates significant weakness in R glut medius, and trendelenburg gait (although has scoliosis and LLD which also contribute to gait deviations).  She continues to report left posterior knee pain, noted trigger points in lateral hamstring.  She would benefit from continued skilled therapy, extended POC to 07/30/21 to continue to improve these deficits and improve safety and pain.     OBJECTIVE IMPAIRMENTS decreased balance, decreased ROM, decreased strength, hypomobility, increased muscle spasms, impaired flexibility, postural dysfunction, and pain.   ACTIVITY LIMITATIONS  all ADLs are limited by pain .   PERSONAL FACTORS Time since onset of injury/illness/exacerbation and 3+ comorbidities: scoliosis, chronic lumbar radiculopathy, h/o left femoral fx  are also affecting patient's functional outcome.   GOALS: Goals reviewed with patient? Yes  SHORT TERM GOALS: Target date: 05/26/2021     Ind with initial HEP Baseline: Goal status: MET   2.  Complete functional balance assesment and set appropriate goals Baseline:  Goal status: MET  LONG TERM GOALS: Target date: 06/23/2021  extended to 07/30/2021  Ind with advanced HEP and progressions Baseline:  Goal status: IN PROGRESS  2.  Decreased pain and N/T in low back and left leg by >=50% with ADLS Baseline: 25% be87%r; back still hurts with vacuuming Goal status: IN PROGRESS (40% improvement), 06/23/21- reports pain returning after medication change.  PT has helped some.    3.  Improved LE strength to 5/5 to improve functional movements Baseline:  Goal status: IN PROGRESS 06/23/21- weakness R glute, 2+/5 glut med  4. Improved 5x  sit to stand to <= 12.6 seconds to decrease risk of falls Baseline: 9.14 sec on 06/15/21 Goal status: MET  5. Improve FGA to > or = 24/30 to decrease risk of falls.  Baseline: 20/30 increased risk of falls Goal status: Initial  PLAN: PT FREQUENCY: 1-2x/week  PT DURATION: 6 weeks  extended to 07/30/21  PLANNED INTERVENTIONS: Therapeutic exercises, Therapeutic activity, Neuromuscular re-education, Balance training, Gait training, Patient/Family education, Joint mobilization, Dry Needling, Electrical stimulation, Spinal mobilization, Cryotherapy, Moist heat, Ionotophoresis 12m/ml Dexamethasone, and Manual therapy.  PLAN FOR NEXT SESSION:  DN L hamstrings, continue to work on R glute strength, dynamic balance and head turns, habituation exercises for VOR  ERennie Natter PT, DPT  06/23/2021, 6:02 PM

## 2021-06-24 ENCOUNTER — Encounter: Payer: Medicare Other | Admitting: Physical Therapy

## 2021-06-30 ENCOUNTER — Encounter: Payer: Self-pay | Admitting: Physical Therapy

## 2021-06-30 ENCOUNTER — Ambulatory Visit: Payer: Medicare Other | Admitting: Physical Therapy

## 2021-06-30 DIAGNOSIS — M5416 Radiculopathy, lumbar region: Secondary | ICD-10-CM

## 2021-06-30 DIAGNOSIS — R2681 Unsteadiness on feet: Secondary | ICD-10-CM

## 2021-06-30 DIAGNOSIS — M6281 Muscle weakness (generalized): Secondary | ICD-10-CM

## 2021-06-30 DIAGNOSIS — R252 Cramp and spasm: Secondary | ICD-10-CM

## 2021-06-30 NOTE — Therapy (Signed)
OUTPATIENT PHYSICAL THERAPY TREATMENT NOTE  Patient Name: Julia Mora MRN: [id number] DOB:1950/11/11, 71 y.o., female Today's Date: 06/30/2021   PT End of Session - 06/30/21 0933     Visit Number 10    Date for PT Re-Evaluation 07/30/21    Authorization Type MCR/BCBS    Progress Note Due on Visit 68    PT Start Time 0933    PT Stop Time 1015    PT Time Calculation (min) 42 min    Activity Tolerance Patient tolerated treatment well    Behavior During Therapy [facility] for tasks assessed/performed                Rationale for Evaluation and Treatment:  Rehabilitation     Past Medical History:  Diagnosis Date   CKD (chronic kidney disease) stage 3, GFR 30-59 ml/min (HCC)    Hypertension    Osteoporosis    Past Surgical History:  Procedure Laterality Date   ABDOMINAL HYSTERECTOMY     ANUS SURGERY     BLADDER SURGERY     FEMUR IM NAIL Left 06/02/2019   Procedure: INTRAMEDULLARY (IM) NAIL FEMORAL;  Surgeon: Newt Minion, MD;  Location: [place];  Service: Orthopedics;  Laterality: Left;   Patient Active Problem List   Diagnosis Date Noted   Fall    Closed left femoral fracture ([place]) 06/01/2019   Essential hypertension 06/01/2019   CKD (chronic kidney disease) stage 3, GFR 30-59 ml/min ([place]) 06/01/2019   Left knee injury 08/21/2012   Right foot pain 07/24/2012    PCP: Loraine Leriche., MD  REFERRING PROVIDER: Lorine Bears, NP  REFERRING DIAG:  M54.16 (ICD-10-CM) - Lumbar radiculopathy  M54.42,G89.29 (ICD-10-CM) - Chronic left-sided low back pain with left-sided sciatica  M51.16 (ICD-10-CM) - Intervertebral disc disorders with radiculopathy, lumbar region  M25.562 (ICD-10-CM) - Posterior left knee pain    THERAPY DIAG:  Radiculopathy, lumbar region  Muscle weakness (generalized)  Cramp and spasm  Unsteadiness on feet  ONSET DATE: 04/03/21  SUBJECTIVE:                                                                                                                                                                                            SUBJECTIVE STATEMENT: Pt reports no pain today.  She had stomach bug yesterday but feeling much better today.    PERTINENT HISTORY:  Chronic left sided LBP with radicuopathy, scoliosis, heel lift left, osteopenia, left femoral shaft fracture from mechanical fall in 2021, HTN, CKD   PAIN:  Are you having pain?  No: NPRS scale:0/10 Pain location: posterior L knee   PRECAUTIONS: Fall  FALLS:  Has patient fallen in last 6 months?  Near miss 05/25/21 see subjective; Near miss  05/10/21  when she lost her balance on her inclined driveway and husband caught her.  LIVING ENVIRONMENT: Lives with: lives with their spouse Lives in: House/apartment Stairs: Yes: Internal: 14 steps; on right going up and External: 3 steps; can reach both Has following equipment at home: None  OCCUPATION: retired  PLOF: [place] wants to improve her QOL, balance and decrease or get rid of sciatica   OBJECTIVE:   DIAGNOSTIC FINDINGS:  MRI from 2022 exhibits multi-level facet hypertrophy and mild broad-based disc bulge with a prominent left lateral disc osteophyte complex at L5-S1. No high grade spinal canal stenosis noted. Patient has history of multiple lumbar epidural steroid injections performed in Dr. Eric Form office, most recent was left L5 and S1 transforaminal epidural steroid injection performed on 03/18/2021 and reports significant relief for approximately 4 days. Patient has history of left femoral shaft fracture from mechanical fall in 2021  PATIENT SURVEYS:  FOTO 56 (predicted 57)  MUSCLE LENGTH:  Bil piriformis L>R, bil HS R>L, quads/HF WNL  POSTURE:  Depressed right shoulder, left convexity scoliosis, left hip ER in standing; has left heel lift  PALPATION: L lateral HS distally and peroneals, L PSIS, R ITB; bil QL Spinal mobility: limited by tight lumbar muscles bil with  pain L>R  LUMBAR ROM:   Active  A/PROM  05/12/2021  Flexion Hands to mid shin  Extension 75% feels unsteady  Right lateral flexion Limited 50%  Left lateral flexion WFL  Right rotation WNL  Left rotation WNL   (Blank rows = not tested)  LE ROM:  Bil hips WFL except IR/ER due to muscular tightness; bil knees WNL  LE MMT:  MMT Right 05/12/2021 Left 05/12/2021 Right 06/15/2021 Left 06/15/2021  Hip flexion 4+ 4+ 4+ 4+  Hip extension 4- 4- 4+ 4  Hip abduction 4+ 4+ 4+ 5  Hip adduction 5 4+ 5 5  Hip internal rotation      Hip external rotation   06/23/21 R glut med 3-/5 06/23/21 glut med 4+/5  Knee flexion 5 5    Knee extension 5 5    Ankle dorsiflexion 5 5    Ankle plantarflexion      Ankle inversion      Ankle eversion       (Blank rows = not tested)  LUMBAR SPECIAL TESTS:  Straight leg raise test: Positive and Slump test: Positive Left side  FUNCTIONAL TESTS:   6MWT 1,015 ft in 6 min, no rest, no c/o of fatigue 5 times sit to stand: 13.13 sec  06/15/21- 9.4 seconds BERG: 49/54 (05/20/21) DGI: 23/24 (05/20/21)   06/23/21 FGA 20/30 (<22/30 indicates increased risk of falls)  TODAY'S TREATMENT  06/30/21 Therapeutic Exercise: to improve strength and mobility.  Gait x 900' - cues for heel strike Gastroc stretch on wall 2 x 30 sec bil  TKE with RTB 10 x 5 sec hold L TKE at wall with rolled towel behind knee x 10 L Seated hamstring stretch 2 x 30 sec bil Manual Therapy: to decrease muscle spasm and pain and improve mobility STM/TPR L hamstrings, skilled palpation and monitoring during dry needling. Trigger Point Dry-Needling  Treatment instructions: Expect mild to moderate muscle soreness.  Patient verbalized understanding of these instructions and education.  Patient Consent Given: Yes Education handout provided: Yes Muscles treated: L hamstrings m/l Treatment response/outcome: Twitch Response Elicited and Palpable Increase  in Muscle Length Neuromuscular Reeducation: to  improve balance and stability. In corner with SBA for safety throughout.   Cues to fix eyes on object when moving head with eyes open to decrease dizziness.  Eyes open feet together x 30 sec  Eyes open feet together with head nods x 10 Eyes open feet together with head turns x 10 Eyes closed feet apart x 30 sec Eyes closed feet apart with head nods x 10 Eyes closed feet apart with head turns x 10   On Airex: Eyes open feet apart x 30 sec  Eyes open feet apart with head nods x 10 Eyes open feet apart with head turns x 10 Eyes closed feet apart x 30 sec Eyes closed feet apart with head nods x 10 Eyes closed feet apart with head turns x 10    06/23/21 - Reevaluation VOR - noted corrective saccades, dizziness with head turns Negative for Epley and roll tests for posterior and horizontal canal lithiasis FGA - 20/30.  Large LOB with head turns, unable to tandem walk.  Weakness in R glute med Gait deviations - LLD  Tenderness with palpation L lateral hamstrings  06/22/21 Therapeutic Exercise: NuStep L6x55mn  Neuro-Reed: Gait with horizontal head turns 2x 1724fGait with EC 170 ft Retro gait 2x17034fonster walk 2x90 ft; retro x [id number] on balance beam 2x Modified tandem walk on balance beam 3x Step ups on BOSU 10x  06/15/21 Neurored: pt assessed for positional vertigo with Dix Marye Roundsitive for R posterior canalithiasis. Treated with Semont maneuver. Walking with horizontal head turns with only slight deviation outside of path; no LOB today.  Therex: Nustep L6x6min33m sit to stand 9.14 sec Single leg bridge 2 x 10 bil Seated and standing hip flexion x 10 each RTB Standing hip ABD RTB 2x10 bil Sidestepping 4 x 10 steps with bil hand hold for balance   06/09/21 Nustep L6x6min 42mnk rotation with red weight ball 10x  Cybex row with trunk rotation 10# x10 each Cybex knee extension 5# x 10 Cybex knee flexion 10# x 10  Nuero Re-ed: Walking with head turns (R,L,  upward gaze) 3x170 ft - unsteady and shortened strides in all directions Fwd lunges with bil arm raise x 10; also 10 reps with trunk rotation  PATIENT EDUCATION:  Education details:    HEP update for balance and TKE Person educated: Patient Education method: Explanation, Demonstration, and Handouts Education comprehension: verbalized understanding and returned demonstration   HOME EXERCISE PROGRAM: Access Code: [email or web address]:  CLINICAL IMPRESSION:  Janele Conni Slippercipated well throughout session.  Initially noted significant crouch gait, but after manual/dry needling and stretches and exercises, noted more extension especially on L side during stance phase.  HEP updated for TKE exercise and hamstrings stretches.  Also started to address balance and VOR deficits with corner balance exercises, cues needed to turn head and not shoulders, increased sway noted on compliant surface.  Standing balance exercises also added to HEP, to perform in corner on level surface with chair in front of her for safety.  Randilyn Bassy Fetterlyens continues to demonstrate potential for improvement and would benefit from continued skilled therapy to address impairments.    OBJECTIVE IMPAIRMENTS decreased balance, decreased ROM, decreased strength, hypomobility, increased muscle spasms, impaired flexibility, postural dysfunction, and pain.   ACTIVITY LIMITATIONS  all ADLs are limited by pain .   PERSONAL FACTORS Time since onset of injury/illness/exacerbation and 3+ comorbidities: scoliosis, chronic lumbar radiculopathy, h/o left femoral  fx  are also affecting patient's functional outcome.   GOALS: Goals reviewed with patient? Yes  SHORT TERM GOALS: Target date: 05/26/2021     Ind with initial HEP Baseline: Goal status: MET   2.  Complete functional balance assesment and set appropriate goals Baseline:  Goal status: MET  LONG TERM GOALS: Target date: 06/23/2021  extended to 07/30/2021  Ind with  advanced HEP and progressions Baseline:  Goal status: IN PROGRESS  2.  Decreased pain and N/T in low back and left leg by >=50% with ADLS Baseline: 07% better; back still hurts with vacuuming Goal status: IN PROGRESS (40% improvement), 06/23/21- reports pain returning after medication change.  PT has helped some.    3.  Improved LE strength to 5/5 to improve functional movements Baseline:  Goal status: IN PROGRESS 06/23/21- weakness R glute, 2+/5 glut med  4. Improved 5x sit to stand to <= 12.6 seconds to decrease risk of falls Baseline: 9.14 sec on 06/15/21 Goal status: MET  5. Improve FGA to > or = 24/30 to decrease risk of falls.  Baseline: 20/30 increased risk of falls Goal status: Initial  PLAN: PT FREQUENCY: 1-2x/week  PT DURATION: 6 weeks extended to 07/30/21  PLANNED INTERVENTIONS: Therapeutic exercises, Therapeutic activity, Neuromuscular re-education, Balance training, Gait training, Patient/Family education, Joint mobilization, Dry Needling, Electrical stimulation, Spinal mobilization, Cryotherapy, Moist heat, Ionotophoresis 69m/ml Dexamethasone, and Manual therapy.  PLAN FOR NEXT SESSION:  continue to work on glute strength, dynamic balance and head turns, habituation exercises for VOR  ERennie Natter PT, DPT  06/30/2021, 12:11 PM

## 2021-07-13 ENCOUNTER — Ambulatory Visit: Payer: Medicare Other | Attending: Physical Medicine and Rehabilitation

## 2021-07-13 DIAGNOSIS — R2681 Unsteadiness on feet: Secondary | ICD-10-CM | POA: Insufficient documentation

## 2021-07-13 DIAGNOSIS — M5416 Radiculopathy, lumbar region: Secondary | ICD-10-CM | POA: Insufficient documentation

## 2021-07-13 DIAGNOSIS — M6281 Muscle weakness (generalized): Secondary | ICD-10-CM | POA: Insufficient documentation

## 2021-07-13 DIAGNOSIS — R252 Cramp and spasm: Secondary | ICD-10-CM | POA: Insufficient documentation

## 2021-07-13 NOTE — Therapy (Signed)
OUTPATIENT PHYSICAL THERAPY TREATMENT NOTE  Patient Name: Julia Mora MRN: [id number] DOB:07-Aug-1950, 71 y.o., female Today's Date: 07/13/2021        Rationale for Evaluation and Treatment:  Rehabilitation     Past Medical History:  Diagnosis Date   CKD (chronic kidney disease) stage 3, GFR 30-59 ml/min (HCC)    Hypertension    Osteoporosis    Past Surgical History:  Procedure Laterality Date   ABDOMINAL HYSTERECTOMY     ANUS SURGERY     BLADDER SURGERY     FEMUR IM NAIL Left 06/02/2019   Procedure: INTRAMEDULLARY (IM) NAIL FEMORAL;  Surgeon: Newt Minion, MD;  Location: [facility];  Service: Orthopedics;  Laterality: Left;   Patient Active Problem List   Diagnosis Date Noted   Fall    Closed left femoral fracture ([place]) 06/01/2019   Essential hypertension 06/01/2019   CKD (chronic kidney disease) stage 3, GFR 30-59 ml/min ([place]) 06/01/2019   Left knee injury 08/21/2012   Right foot pain 07/24/2012    PCP: Loraine Leriche., MD  REFERRING PROVIDER: Lorine Bears, NP  REFERRING DIAG:  M54.16 (ICD-10-CM) - Lumbar radiculopathy  M54.42,G89.29 (ICD-10-CM) - Chronic left-sided low back pain with left-sided sciatica  M51.16 (ICD-10-CM) - Intervertebral disc disorders with radiculopathy, lumbar region  M25.562 (ICD-10-CM) - Posterior left knee pain    THERAPY DIAG:  Radiculopathy, lumbar region  Muscle weakness (generalized)  Cramp and spasm  Unsteadiness on feet  ONSET DATE: 04/03/21  SUBJECTIVE:                                                                                                                                                                                           SUBJECTIVE STATEMENT: Pt reports that her balance can be better or worse depending on the day.   PERTINENT HISTORY:  Chronic left sided LBP with radicuopathy, scoliosis, heel lift left, osteopenia, left femoral shaft fracture from mechanical fall in 2021, HTN, CKD    PAIN:  Are you having pain?  No: NPRS scale:0/10 Pain location: posterior L knee   PRECAUTIONS: Fall   FALLS:  Has patient fallen in last 6 months?  Near miss 05/25/21 see subjective; Near miss  05/10/21  when she lost her balance on her inclined driveway and husband caught her.  LIVING ENVIRONMENT: Lives with: lives with their spouse Lives in: House/apartment Stairs: Yes: Internal: 14 steps; on right going up and External: 3 steps; can reach both Has following equipment at home: None  OCCUPATION: retired  PLOF: [place] wants to improve her QOL, balance and decrease or get rid of sciatica  OBJECTIVE:   DIAGNOSTIC FINDINGS:  MRI from 2022 exhibits multi-level facet hypertrophy and mild broad-based disc bulge with a prominent left lateral disc osteophyte complex at L5-S1. No high grade spinal canal stenosis noted. Patient has history of multiple lumbar epidural steroid injections performed in Dr. Eric Form office, most recent was left L5 and S1 transforaminal epidural steroid injection performed on 03/18/2021 and reports significant relief for approximately 4 days. Patient has history of left femoral shaft fracture from mechanical fall in 2021  PATIENT SURVEYS:  FOTO 56 (predicted 57)  MUSCLE LENGTH:  Bil piriformis L>R, bil HS R>L, quads/HF WNL  POSTURE:  Depressed right shoulder, left convexity scoliosis, left hip ER in standing; has left heel lift  PALPATION: L lateral HS distally and peroneals, L PSIS, R ITB; bil QL Spinal mobility: limited by tight lumbar muscles bil with pain L>R  LUMBAR ROM:   Active  A/PROM  05/12/2021  Flexion Hands to mid shin  Extension 75% feels unsteady  Right lateral flexion Limited 50%  Left lateral flexion WFL  Right rotation WNL  Left rotation WNL   (Blank rows = not tested)  LE ROM:  Bil hips WFL except IR/ER due to muscular tightness; bil knees WNL  LE MMT:  MMT Right 05/12/2021 Left 05/12/2021  Right 06/15/2021 Left 06/15/2021  Hip flexion 4+ 4+ 4+ 4+  Hip extension 4- 4- 4+ 4  Hip abduction 4+ 4+ 4+ 5  Hip adduction 5 4+ 5 5  Hip internal rotation      Hip external rotation   06/23/21 R glut med 3-/5 06/23/21 glut med 4+/5  Knee flexion 5 5    Knee extension 5 5    Ankle dorsiflexion 5 5    Ankle plantarflexion      Ankle inversion      Ankle eversion       (Blank rows = not tested)  LUMBAR SPECIAL TESTS:  Straight leg raise test: Positive and Slump test: Positive Left side  FUNCTIONAL TESTS:   6MWT 1,015 ft in 6 min, no rest, no c/o of fatigue 5 times sit to stand: 13.13 sec  06/15/21- 9.4 seconds BERG: 49/54 (05/20/21) DGI: 23/24 (05/20/21)   06/23/21 FGA 20/30 (<22/30 indicates increased risk of falls)  TODAY'S TREATMENT  07/13/21 Therapeutic Exercise: Nustep L6x62mn Knee flexion 15# x 10 Knee extension 10# x 10 Nueromuscular Re-ed: Standing on airex with horizontal and vertical head turns eyes fixed on object in front 10x  Standing toe clears 9' step from airex 2x10 Standing hip extension from ariex x 10 Standing hip abduction from airex x 10 Standing hip flexion from airex x 10  06/30/21 Therapeutic Exercise: to improve strength and mobility.  Gait x 900' - cues for heel strike Gastroc stretch on wall 2 x 30 sec bil  TKE with RTB 10 x 5 sec hold L TKE at wall with rolled towel behind knee x 10 L Seated hamstring stretch 2 x 30 sec bil Manual Therapy: to decrease muscle spasm and pain and improve mobility STM/TPR L hamstrings, skilled palpation and monitoring during dry needling. Trigger Point Dry-Needling  Treatment instructions: Expect mild to moderate muscle soreness.  Patient verbalized understanding of these instructions and education.  Patient Consent Given: Yes Education handout provided: Yes Muscles treated: L hamstrings m/l Treatment response/outcome: Twitch Response Elicited and Palpable Increase in Muscle Length Neuromuscular Reeducation: to  improve balance and stability. In corner with SBA for safety throughout.   Cues to fix eyes on object when  moving head with eyes open to decrease dizziness.  Eyes open feet together x 30 sec  Eyes open feet together with head nods x 10 Eyes open feet together with head turns x 10 Eyes closed feet apart x 30 sec Eyes closed feet apart with head nods x 10 Eyes closed feet apart with head turns x 10   On Airex: Eyes open feet apart x 30 sec  Eyes open feet apart with head nods x 10 Eyes open feet apart with head turns x 10 Eyes closed feet apart x 30 sec Eyes closed feet apart with head nods x 10 Eyes closed feet apart with head turns x 10    06/23/21 - Reevaluation VOR - noted corrective saccades, dizziness with head turns Negative for Epley and roll tests for posterior and horizontal canal lithiasis FGA - 20/30.  Large LOB with head turns, unable to tandem walk.  Weakness in R glute med Gait deviations - LLD  Tenderness with palpation L lateral hamstrings  06/22/21 Therapeutic Exercise: NuStep L6x74mn  Neuro-Reed: Gait with horizontal head turns 2x 1769fGait with EC 170 ft Retro gait 2x1708fonster walk 2x90 ft; retro x [id number] on balance beam 2x Modified tandem walk on balance beam 3x Step ups on BOSU 10x  06/15/21 Neurored: pt assessed for positional vertigo with Dix Marye Roundsitive for R posterior canalithiasis. Treated with [place] maneuver. Walking with horizontal head turns with only slight deviation outside of path; no LOB today.  Therex: Nustep L6x6min46m sit to stand 9.14 sec Single leg bridge 2 x 10 bil Seated and standing hip flexion x 10 each RTB Standing hip ABD RTB 2x10 bil Sidestepping 4 x 10 steps with bil hand hold for balance   06/09/21 Nustep L6x6min 76mnk rotation with red weight ball 10x  Cybex row with trunk rotation 10# x10 each Cybex knee extension 5# x 10 Cybex knee flexion 10# x 10  Nuero Re-ed: Walking with head turns (R,L,  upward gaze) 3x170 ft - unsteady and shortened strides in all directions Fwd lunges with bil arm raise x 10; also 10 reps with trunk rotation  PATIENT EDUCATION:  Education details:    HEP update for balance and TKE Person educated: Patient Education method: Explanation, Demonstration, and Handouts Education comprehension: verbalized understanding and returned demonstration   HOME EXERCISE PROGRAM: Access Code: [email or web address]:  CLINICAL IMPRESSION:  Session focused on proprioceptive awareness and neuromuscular re-ed to decrease risk of falls. Pt showed more postural deviation to R side with most balance interventions. CGA required throughout session w/ balance exercises for safety. Cues given throughout session to correct form and technique.  OBJECTIVE IMPAIRMENTS decreased balance, decreased ROM, decreased strength, hypomobility, increased muscle spasms, impaired flexibility, postural dysfunction, and pain.   ACTIVITY LIMITATIONS  all ADLs are limited by pain .   PERSONAL FACTORS Time since onset of injury/illness/exacerbation and 3+ comorbidities: scoliosis, chronic lumbar radiculopathy, h/o left femoral fx  are also affecting patient's functional outcome.   GOALS: Goals reviewed with patient? Yes  SHORT TERM GOALS: Target date: 05/26/2021     Ind with initial HEP Baseline: Goal status: MET   2.  Complete functional balance assesment and set appropriate goals Baseline:  Goal status: MET  LONG TERM GOALS: Target date: 07/30/2021  extended to 07/30/2021  Ind with advanced HEP and progressions Baseline:  Goal status: IN PROGRESS  2.  Decreased pain and N/T in low back and left leg by >=50% with ADLS Baseline: 25% be70%r;  back still hurts with vacuuming Goal status: IN PROGRESS (40% improvement), 06/23/21- reports pain returning after medication change.  PT has helped some.    3.  Improved LE strength to 5/5 to improve functional movements Baseline:  Goal status:  IN PROGRESS 06/23/21- weakness R glute, 2+/5 glut med  4. Improved 5x sit to stand to <= 12.6 seconds to decrease risk of falls Baseline: 9.14 sec on 06/15/21 Goal status: MET  5. Improve FGA to > or = 24/30 to decrease risk of falls.  Baseline: 20/30 increased risk of falls Goal status: Initial  PLAN: PT FREQUENCY: 1-2x/week  PT DURATION: 6 weeks extended to 07/30/21  PLANNED INTERVENTIONS: Therapeutic exercises, Therapeutic activity, Neuromuscular re-education, Balance training, Gait training, Patient/Family education, Joint mobilization, Dry Needling, Electrical stimulation, Spinal mobilization, Cryotherapy, Moist heat, Ionotophoresis 16m/ml Dexamethasone, and Manual therapy.  PLAN FOR NEXT SESSION:  continue to work on glute strength, dynamic balance and head turns, habituation exercises for VOR  BArtist Pais PTA 07/13/2021, 3:38 PM

## 2021-07-15 ENCOUNTER — Encounter: Payer: Self-pay | Admitting: Physical Therapy

## 2021-07-15 ENCOUNTER — Ambulatory Visit: Payer: Medicare Other | Admitting: Physical Therapy

## 2021-07-15 DIAGNOSIS — R2681 Unsteadiness on feet: Secondary | ICD-10-CM

## 2021-07-15 DIAGNOSIS — M5416 Radiculopathy, lumbar region: Secondary | ICD-10-CM | POA: Diagnosis not present

## 2021-07-15 DIAGNOSIS — M6281 Muscle weakness (generalized): Secondary | ICD-10-CM

## 2021-07-15 DIAGNOSIS — R252 Cramp and spasm: Secondary | ICD-10-CM

## 2021-07-15 NOTE — Therapy (Signed)
OUTPATIENT PHYSICAL THERAPY TREATMENT NOTE  Patient Name: Julia Mora MRN: [id number] DOB:Oct 12, 1950, 71 y.o., female Today's Date: 07/15/2021   PT End of Session - 07/15/21 1617     Visit Number 12    Date for PT Re-Evaluation 07/30/21    Authorization Type MCR/BCBS    Progress Note Due on Visit 21    PT Start Time 1617    PT Stop Time 1659    PT Time Calculation (min) 42 min    Activity Tolerance Patient tolerated treatment well    Behavior During Therapy [facility] for tasks assessed/performed                 Rationale for Evaluation and Treatment:  Rehabilitation     Past Medical History:  Diagnosis Date   CKD (chronic kidney disease) stage 3, GFR 30-59 ml/min (HCC)    Hypertension    Osteoporosis    Past Surgical History:  Procedure Laterality Date   ABDOMINAL HYSTERECTOMY     ANUS SURGERY     BLADDER SURGERY     FEMUR IM NAIL Left 06/02/2019   Procedure: INTRAMEDULLARY (IM) NAIL FEMORAL;  Surgeon: Newt Minion, MD;  Location: [facility];  Service: Orthopedics;  Laterality: Left;   Patient Active Problem List   Diagnosis Date Noted   Fall    Closed left femoral fracture ([place]) 06/01/2019   Essential hypertension 06/01/2019   CKD (chronic kidney disease) stage 3, GFR 30-59 ml/min ([place]) 06/01/2019   Left knee injury 08/21/2012   Right foot pain 07/24/2012    PCP: Loraine Leriche., MD  REFERRING PROVIDER: Lorine Bears, NP  REFERRING DIAG:  M54.16 (ICD-10-CM) - Lumbar radiculopathy  M54.42,G89.29 (ICD-10-CM) - Chronic left-sided low back pain with left-sided sciatica  M51.16 (ICD-10-CM) - Intervertebral disc disorders with radiculopathy, lumbar region  M25.562 (ICD-10-CM) - Posterior left knee pain    THERAPY DIAG:  Radiculopathy, lumbar region  Muscle weakness (generalized)  Cramp and spasm  Unsteadiness on feet  ONSET DATE: 04/03/21  SUBJECTIVE:                                                                                                                                                                                            SUBJECTIVE STATEMENT: Pt reports no pain today.  Denies falls or LOB.     PERTINENT HISTORY:  Chronic left sided LBP with radicuopathy, scoliosis, heel lift left, osteopenia, left femoral shaft fracture from mechanical fall in 2021, HTN, CKD   PAIN:  Are you having pain?  No: NPRS scale:0/10 Pain location: posterior L knee   PRECAUTIONS: Fall   FALLS:  Has patient fallen in last 6 months?  Near miss 05/25/21 see subjective; Near miss  05/10/21  when she lost her balance on her inclined driveway and husband caught her.  LIVING ENVIRONMENT: Lives with: lives with their spouse Lives in: House/apartment Stairs: Yes: Internal: 14 steps; on right going up and External: 3 steps; can reach both Has following equipment at home: None  OCCUPATION: retired  PLOF: [place] wants to improve her QOL, balance and decrease or get rid of sciatica   OBJECTIVE:   DIAGNOSTIC FINDINGS:  MRI from 2022 exhibits multi-level facet hypertrophy and mild broad-based disc bulge with a prominent left lateral disc osteophyte complex at L5-S1. No high grade spinal canal stenosis noted. Patient has history of multiple lumbar epidural steroid injections performed in Dr. Eric Form office, most recent was left L5 and S1 transforaminal epidural steroid injection performed on 03/18/2021 and reports significant relief for approximately 4 days. Patient has history of left femoral shaft fracture from mechanical fall in 2021  PATIENT SURVEYS:  FOTO 56 (predicted 57)  MUSCLE LENGTH:  Bil piriformis L>R, bil HS R>L, quads/HF WNL  POSTURE:  Depressed right shoulder, left convexity scoliosis, left hip ER in standing; has left heel lift  PALPATION: L lateral HS distally and peroneals, L PSIS, R ITB; bil QL Spinal mobility: limited by tight lumbar muscles bil with pain L>R  LUMBAR ROM:   Active   A/PROM  05/12/2021  Flexion Hands to mid shin  Extension 75% feels unsteady  Right lateral flexion Limited 50%  Left lateral flexion WFL  Right rotation WNL  Left rotation WNL   (Blank rows = not tested)  LE ROM:  Bil hips WFL except IR/ER due to muscular tightness; bil knees WNL  LE MMT:  MMT Right 05/12/2021 Left 05/12/2021 Right 06/15/2021 Left 06/15/2021  Hip flexion 4+ 4+ 4+ 4+  Hip extension 4- 4- 4+ 4  Hip abduction 4+ 4+ 4+ 5  Hip adduction 5 4+ 5 5  Hip internal rotation      Hip external rotation   06/23/21 R glut med 3-/5 06/23/21 glut med 4+/5  Knee flexion 5 5    Knee extension 5 5    Ankle dorsiflexion 5 5    Ankle plantarflexion      Ankle inversion      Ankle eversion       (Blank rows = not tested)  LUMBAR SPECIAL TESTS:  Straight leg raise test: Positive and Slump test: Positive Left side  FUNCTIONAL TESTS:   6MWT 1,015 ft in 6 min, no rest, no c/o of fatigue 5 times sit to stand: 13.13 sec  06/15/21- 9.4 seconds BERG: 49/54 (05/20/21) DGI: 23/24 (05/20/21)   06/23/21 FGA 20/30 (<22/30 indicates increased risk of falls)  TODAY'S TREATMENT  07/15/21 Therapeutic Exercise: to improve strength and mobility.  Nustep L5 x 6 min Gait with cues for heel strike x 300'  Neuromuscular Reeducation: to improve balance and stability. SBA for safety throughout.  On Airex in corner for safety: Eyes open feet apart x 30 sec  Eyes open feet apart with head nods x 10 Eyes open feet apart with head turns x 10 Eyes closed feet apart x 30 sec Eyes closed feet apart with head nods x 10 Eyes closed feet apart with head turns x 10  Gait with head turns x 50' Gait wit ball toss x 100' Side step with ball toss x 50' each direction Gait with passing ball x 150' Balance  board - frontal and sagittal planes.  Forward step with arm raise for stepping strategy x 10 bil  Side step with arm raise for stepping strategy x 10 bil   07/13/21 Therapeutic Exercise: Nustep L6x43mn Knee  flexion 15# x 10 Knee extension 10# x 10 Neuromuscular Re-ed: Standing on airex with horizontal and vertical head turns eyes fixed on object in front 10x  Standing toe clears 9' step from airex 2x10 Standing hip extension from ariex x 10 Standing hip abduction from airex x 10 Standing hip flexion from airex x 10  06/30/21 Therapeutic Exercise: to improve strength and mobility.  Gait x 900' - cues for heel strike Gastroc stretch on wall 2 x 30 sec bil  TKE with RTB 10 x 5 sec hold L TKE at wall with rolled towel behind knee x 10 L Seated hamstring stretch 2 x 30 sec bil Manual Therapy: to decrease muscle spasm and pain and improve mobility STM/TPR L hamstrings, skilled palpation and monitoring during dry needling. Trigger Point Dry-Needling  Treatment instructions: Expect mild to moderate muscle soreness.  Patient verbalized understanding of these instructions and education.  Patient Consent Given: Yes Education handout provided: Yes Muscles treated: L hamstrings m/l Treatment response/outcome: Twitch Response Elicited and Palpable Increase in Muscle Length Neuromuscular Reeducation: to improve balance and stability. In corner with SBA for safety throughout.   Cues to fix eyes on object when moving head with eyes open to decrease dizziness.  Eyes open feet together x 30 sec  Eyes open feet together with head nods x 10 Eyes open feet together with head turns x 10 Eyes closed feet apart x 30 sec Eyes closed feet apart with head nods x 10 Eyes closed feet apart with head turns x 10   On Airex: Eyes open feet apart x 30 sec  Eyes open feet apart with head nods x 10 Eyes open feet apart with head turns x 10 Eyes closed feet apart x 30 sec Eyes closed feet apart with head nods x 10 Eyes closed feet apart with head turns x 10    06/23/21 - Reevaluation VOR - noted corrective saccades, dizziness with head turns Negative for Epley and roll tests for posterior and horizontal canal  lithiasis FGA - 20/30.  Large LOB with head turns, unable to tandem walk.  Weakness in R glute med Gait deviations - LLD  Tenderness with palpation L lateral hamstrings  06/22/21 Therapeutic Exercise: NuStep L6x69m  Neuro-Reed: Gait with horizontal head turns 2x 17072fait with EC 170 ft Retro gait [id number] walk 2x90 ft; retro x 90ft20festeps on balance beam 2x Modified tandem walk on balance beam 3x Step ups on BOSU 10x      PATIENT EDUCATION:  Education details:    HEP update for balance and TKE Person educated: Patient Education method: Explanation, Demonstration, and Handouts Education comprehension: verbalized understanding and returned demonstration   HOME EXERCISE PROGRAM: Access Code: [email or web address]:  CLINICAL IMPRESSION: Focus of today's session was on balance and gait, incorporating more dynamic activities for hip and stepping strategy.  She demonstrated better static balance on airex today.  However, with gait she continues to shuffle, with occasional LOB due to poor toe clearance, needing frequent cues throughout for posture.  SusanDeneise Gettyhens continues to demonstrate potential for improvement and would benefit from continued skilled therapy to address impairments.     OBJECTIVE IMPAIRMENTS decreased balance, decreased ROM, decreased strength, hypomobility, increased muscle spasms, impaired flexibility, postural dysfunction,  and pain.   ACTIVITY LIMITATIONS  all ADLs are limited by pain .   PERSONAL FACTORS Time since onset of injury/illness/exacerbation and 3+ comorbidities: scoliosis, chronic lumbar radiculopathy, h/o left femoral fx  are also affecting patient's functional outcome.   GOALS: Goals reviewed with patient? Yes  SHORT TERM GOALS: Target date: 05/26/2021     Ind with initial HEP Baseline: Goal status: MET   2.  Complete functional balance assesment and set appropriate goals Baseline:  Goal status: MET  LONG TERM GOALS:  Target date: 07/30/2021  extended to 07/30/2021  Ind with advanced HEP and progressions Baseline:  Goal status: IN PROGRESS  2.  Decreased pain and N/T in low back and left leg by >=50% with ADLS Baseline: 60% better; back still hurts with vacuuming Goal status: IN PROGRESS (40% improvement), 06/23/21- reports pain returning after medication change.  PT has helped some.    3.  Improved LE strength to 5/5 to improve functional movements Baseline:  Goal status: IN PROGRESS 06/23/21- weakness R glute, 2+/5 glut med  4. Improved 5x sit to stand to <= 12.6 seconds to decrease risk of falls Baseline: 9.14 sec on 06/15/21 Goal status: MET  5. Improve FGA to > or = 24/30 to decrease risk of falls.  Baseline: 20/30 increased risk of falls Goal status: Initial  PLAN: PT FREQUENCY: 1-2x/week  PT DURATION: 6 weeks extended to 07/30/21  PLANNED INTERVENTIONS: Therapeutic exercises, Therapeutic activity, Neuromuscular re-education, Balance training, Gait training, Patient/Family education, Joint mobilization, Dry Needling, Electrical stimulation, Spinal mobilization, Cryotherapy, Moist heat, Ionotophoresis 36m/ml Dexamethasone, and Manual therapy.  PLAN FOR NEXT SESSION:  continue to work on glute strength, dynamic balance and head turns, habituation exercises for VOR  ERennie Natter PT, DPT  07/15/2021, 5:51 PM

## 2021-07-19 ENCOUNTER — Ambulatory Visit: Payer: Medicare Other | Admitting: Physical Therapy

## 2021-07-23 ENCOUNTER — Ambulatory Visit: Payer: Medicare Other

## 2021-07-23 DIAGNOSIS — R252 Cramp and spasm: Secondary | ICD-10-CM

## 2021-07-23 DIAGNOSIS — R2681 Unsteadiness on feet: Secondary | ICD-10-CM

## 2021-07-23 DIAGNOSIS — M5416 Radiculopathy, lumbar region: Secondary | ICD-10-CM

## 2021-07-23 DIAGNOSIS — M6281 Muscle weakness (generalized): Secondary | ICD-10-CM

## 2021-07-23 NOTE — Therapy (Signed)
OUTPATIENT PHYSICAL THERAPY TREATMENT NOTE  Patient Name: Julia Mora MRN: [id number] DOB:[birth year], 71 y.o., female Today's Date: 07/23/2021   PT End of Session - 07/23/21 1151     Visit Number 13    Date for PT Re-Evaluation 07/30/21    Authorization Type MCR/BCBS    Progress Note Due on Visit 69    PT Start Time 1105    PT Stop Time 1145    PT Time Calculation (min) 40 min    Activity Tolerance Patient tolerated treatment well    Behavior During Therapy [facility] for tasks assessed/performed                  Rationale for Evaluation and Treatment:  Rehabilitation     Past Medical History:  Diagnosis Date   CKD (chronic kidney disease) stage 3, GFR 30-59 ml/min (HCC)    Hypertension    Osteoporosis    Past Surgical History:  Procedure Laterality Date   ABDOMINAL HYSTERECTOMY     ANUS SURGERY     BLADDER SURGERY     FEMUR IM NAIL Left 06/02/2019   Procedure: INTRAMEDULLARY (IM) NAIL FEMORAL;  Surgeon: Newt Minion, MD;  Location: [place];  Service: Orthopedics;  Laterality: Left;   Patient Active Problem List   Diagnosis Date Noted   Fall    Closed left femoral fracture ([place]) 06/01/2019   Essential hypertension 06/01/2019   CKD (chronic kidney disease) stage 3, GFR 30-59 ml/min ([place]) 06/01/2019   Left knee injury 08/21/2012   Right foot pain 07/24/2012    PCP: Loraine Leriche., MD  REFERRING PROVIDER: Lorine Bears, NP  REFERRING DIAG:  M54.16 (ICD-10-CM) - Lumbar radiculopathy  M54.42,G89.29 (ICD-10-CM) - Chronic left-sided low back pain with left-sided sciatica  M51.16 (ICD-10-CM) - Intervertebral disc disorders with radiculopathy, lumbar region  M25.562 (ICD-10-CM) - Posterior left knee pain    THERAPY DIAG:  Radiculopathy, lumbar region  Muscle weakness (generalized)  Cramp and spasm  Unsteadiness on feet  ONSET DATE: 04/03/21  SUBJECTIVE:                                                                                                                                                                                            SUBJECTIVE STATEMENT: Pt reports having trouble with L sciatic pain again.   PERTINENT HISTORY:  Chronic left sided LBP with radicuopathy, scoliosis, heel lift left, osteopenia, left femoral shaft fracture from mechanical fall in 2021, HTN, CKD   PAIN:  Are you having pain?  No: NPRS scale:3/10 Pain location: L leg   PRECAUTIONS: Fall   FALLS:  Has patient fallen  in last 6 months?  Near miss 05/25/21 see subjective; Near miss  05/10/21  when she lost her balance on her inclined driveway and husband caught her.  LIVING ENVIRONMENT: Lives with: lives with their spouse Lives in: House/apartment Stairs: Yes: Internal: 14 steps; on right going up and External: 3 steps; can reach both Has following equipment at home: None  OCCUPATION: retired  PLOF: [place] wants to improve her QOL, balance and decrease or get rid of sciatica   OBJECTIVE:   DIAGNOSTIC FINDINGS:  MRI from 2022 exhibits multi-level facet hypertrophy and mild broad-based disc bulge with a prominent left lateral disc osteophyte complex at L5-S1. No high grade spinal canal stenosis noted. Patient has history of multiple lumbar epidural steroid injections performed in Dr. Eric Form office, most recent was left L5 and S1 transforaminal epidural steroid injection performed on 03/18/2021 and reports significant relief for approximately 4 days. Patient has history of left femoral shaft fracture from mechanical fall in 2021  PATIENT SURVEYS:  FOTO 56 (predicted 57)  MUSCLE LENGTH:  Bil piriformis L>R, bil HS R>L, quads/HF WNL  POSTURE:  Depressed right shoulder, left convexity scoliosis, left hip ER in standing; has left heel lift  PALPATION: L lateral HS distally and peroneals, L PSIS, R ITB; bil QL Spinal mobility: limited by tight lumbar muscles bil with pain L>R  LUMBAR ROM:   Active  A/PROM   05/12/2021  Flexion Hands to mid shin  Extension 75% feels unsteady  Right lateral flexion Limited 50%  Left lateral flexion WFL  Right rotation WNL  Left rotation WNL   (Blank rows = not tested)  LE ROM:  Bil hips WFL except IR/ER due to muscular tightness; bil knees WNL  LE MMT:  MMT Right 05/12/2021 Left 05/12/2021 Right 06/15/2021 Left 06/15/2021  Hip flexion 4+ 4+ 4+ 4+  Hip extension 4- 4- 4+ 4  Hip abduction 4+ 4+ 4+ 5  Hip adduction 5 4+ 5 5  Hip internal rotation      Hip external rotation   06/23/21 R glut med 3-/5 06/23/21 glut med 4+/5  Knee flexion 5 5    Knee extension 5 5    Ankle dorsiflexion 5 5    Ankle plantarflexion      Ankle inversion      Ankle eversion       (Blank rows = not tested)  LUMBAR SPECIAL TESTS:  Straight leg raise test: Positive and Slump test: Positive Left side  FUNCTIONAL TESTS:   6MWT 1,015 ft in 6 min, no rest, no c/o of fatigue 5 times sit to stand: 13.13 sec  06/15/21- 9.4 seconds BERG: 49/54 (05/20/21) DGI: 23/24 (05/20/21)   06/23/21 FGA 20/30 (<22/30 indicates increased risk of falls)  TODAY'S TREATMENT  07/23/21 Therapeutic Exercise: Rec Bike L2x24mn Supine sciatic nerve glide 10x L figure 4 stretch x 30 sec L KTOS x 30 sec Bridges 2x10 with 3 sec hold SLR R/L x 10 Prone press up 3x10" Prone hip extension x 10 bil  07/15/21 Therapeutic Exercise: to improve strength and mobility.  Nustep L5 x 6 min Gait with cues for heel strike x 300'  Neuromuscular Reeducation: to improve balance and stability. SBA for safety throughout.  On Airex in corner for safety: Eyes open feet apart x 30 sec  Eyes open feet apart with head nods x 10 Eyes open feet apart with head turns x 10 Eyes closed feet apart x 30 sec Eyes closed feet apart with head  nods x 10 Eyes closed feet apart with head turns x 10  Gait with head turns x 50' Gait wit ball toss x 100' Side step with ball toss x 50' each direction Gait with passing ball x  150' Balance board - frontal and sagittal planes.  Forward step with arm raise for stepping strategy x 10 bil  Side step with arm raise for stepping strategy x 10 bil   07/13/21 Therapeutic Exercise: Nustep L6x43mn Knee flexion 15# x 10 Knee extension 10# x 10 Neuromuscular Re-ed: Standing on airex with horizontal and vertical head turns eyes fixed on object in front 10x  Standing toe clears 9' step from airex 2x10 Standing hip extension from ariex x 10 Standing hip abduction from airex x 10 Standing hip flexion from airex x 10  06/30/21 Therapeutic Exercise: to improve strength and mobility.  Gait x 900' - cues for heel strike Gastroc stretch on wall 2 x 30 sec bil  TKE with RTB 10 x 5 sec hold L TKE at wall with rolled towel behind knee x 10 L Seated hamstring stretch 2 x 30 sec bil Manual Therapy: to decrease muscle spasm and pain and improve mobility STM/TPR L hamstrings, skilled palpation and monitoring during dry needling. Trigger Point Dry-Needling  Treatment instructions: Expect mild to moderate muscle soreness.  Patient verbalized understanding of these instructions and education.  Patient Consent Given: Yes Education handout provided: Yes Muscles treated: L hamstrings m/l Treatment response/outcome: Twitch Response Elicited and Palpable Increase in Muscle Length Neuromuscular Reeducation: to improve balance and stability. In corner with SBA for safety throughout.   Cues to fix eyes on object when moving head with eyes open to decrease dizziness.  Eyes open feet together x 30 sec  Eyes open feet together with head nods x 10 Eyes open feet together with head turns x 10 Eyes closed feet apart x 30 sec Eyes closed feet apart with head nods x 10 Eyes closed feet apart with head turns x 10   On Airex: Eyes open feet apart x 30 sec  Eyes open feet apart with head nods x 10 Eyes open feet apart with head turns x 10 Eyes closed feet apart x 30 sec Eyes closed feet apart  with head nods x 10 Eyes closed feet apart with head turns x 10    06/23/21 - Reevaluation VOR - noted corrective saccades, dizziness with head turns Negative for Epley and roll tests for posterior and horizontal canal lithiasis FGA - 20/30.  Large LOB with head turns, unable to tandem walk.  Weakness in R glute med Gait deviations - LLD  Tenderness with palpation L lateral hamstrings  06/22/21 Therapeutic Exercise: NuStep L6x639m  Neuro-Reed: Gait with horizontal head turns 2x 17015fait with EC 170 ft Retro gait [id number] walk 2x90 ft; retro x 90ft35festeps on balance beam 2x Modified tandem walk on balance beam 3x Step ups on BOSU 10x      PATIENT EDUCATION:  Education details:    HEP update for balance and TKE Person educated: Patient Education method: ExplaConsulting civil engineeronstration, and Handouts Education comprehension: verbalized understanding and returned demonstration   HOME EXERCISE PROGRAM: Access Code: [email or web address]:  CLINICAL IMPRESSION: Focused session on exercises to reduce radicular symptoms down L LE and monitored patient to ensure no pain with interventions. Worked on hip strengthening with emphasis on core stability. She demonstrated hip weakness with both LE but mostly noted in the gluteals. Cueing required with prone hip ext  not to rotate pelvis. Pt responded well and noted pain decrease from 3/10 to 1/10 post session.   OBJECTIVE IMPAIRMENTS decreased balance, decreased ROM, decreased strength, hypomobility, increased muscle spasms, impaired flexibility, postural dysfunction, and pain.   ACTIVITY LIMITATIONS  all ADLs are limited by pain .   PERSONAL FACTORS Time since onset of injury/illness/exacerbation and 3+ comorbidities: scoliosis, chronic lumbar radiculopathy, h/o left femoral fx  are also affecting patient's functional outcome.   GOALS: Goals reviewed with patient? Yes  SHORT TERM GOALS: Target date: 05/26/2021     Ind with  initial HEP Baseline: Goal status: MET   2.  Complete functional balance assesment and set appropriate goals Baseline:  Goal status: MET  LONG TERM GOALS: Target date: 07/30/2021  extended to 07/30/2021  Ind with advanced HEP and progressions Baseline:  Goal status: IN PROGRESS - met for current (07/23/21)  2.  Decreased pain and N/T in low back and left leg by >=50% with ADLS Baseline: 18% better; back still hurts with vacuuming Goal status: IN PROGRESS (40% improvement), 06/23/21- reports pain returning after medication change.  PT has helped some.    3.  Improved LE strength to 5/5 to improve functional movements Baseline:  Goal status: IN PROGRESS 06/23/21- weakness R glute, 2+/5 glut med  4. Improved 5x sit to stand to <= 12.6 seconds to decrease risk of falls Baseline: 9.14 sec on 06/15/21 Goal status: MET  5. Improve FGA to > or = 24/30 to decrease risk of falls.  Baseline: 20/30 increased risk of falls Goal status: Initial  PLAN: PT FREQUENCY: 1-2x/week  PT DURATION: 6 weeks extended to 07/30/21  PLANNED INTERVENTIONS: Therapeutic exercises, Therapeutic activity, Neuromuscular re-education, Balance training, Gait training, Patient/Family education, Joint mobilization, Dry Needling, Electrical stimulation, Spinal mobilization, Cryotherapy, Moist heat, Ionotophoresis 26m/ml Dexamethasone, and Manual therapy.  PLAN FOR NEXT SESSION:  continue to work on glute strength, dynamic balance and head turns, habituation exercises for VOR  Arbie Blankley L Corwyn Vora, PTA 07/23/2021, 12:01 PM

## 2021-07-26 ENCOUNTER — Encounter: Payer: Self-pay | Admitting: Physical Therapy

## 2021-07-26 ENCOUNTER — Ambulatory Visit: Payer: Medicare Other | Admitting: Physical Therapy

## 2021-07-26 DIAGNOSIS — M5416 Radiculopathy, lumbar region: Secondary | ICD-10-CM

## 2021-07-26 DIAGNOSIS — R252 Cramp and spasm: Secondary | ICD-10-CM

## 2021-07-26 DIAGNOSIS — R2681 Unsteadiness on feet: Secondary | ICD-10-CM

## 2021-07-26 DIAGNOSIS — M6281 Muscle weakness (generalized): Secondary | ICD-10-CM

## 2021-07-26 NOTE — Therapy (Signed)
OUTPATIENT PHYSICAL THERAPY TREATMENT NOTE  Patient Name: Julia Mora MRN: [id number] DOB:[birth year], 71 y.o., female Today's Date: 07/26/2021   PT End of Session - 07/26/21 1534     Visit Number 14    Date for PT Re-Evaluation 07/30/21    Authorization Type MCR/BCBS    Progress Note Due on Visit 72    PT Start Time 1531    PT Stop Time 1617    PT Time Calculation (min) 46 min    Activity Tolerance Patient tolerated treatment well    Behavior During Therapy [facility] for tasks assessed/performed                  Rationale for Evaluation and Treatment:  Rehabilitation     Past Medical History:  Diagnosis Date   CKD (chronic kidney disease) stage 3, GFR 30-59 ml/min (HCC)    Hypertension    Osteoporosis    Past Surgical History:  Procedure Laterality Date   ABDOMINAL HYSTERECTOMY     ANUS SURGERY     BLADDER SURGERY     FEMUR IM NAIL Left 06/02/2019   Procedure: INTRAMEDULLARY (IM) NAIL FEMORAL;  Surgeon: Newt Minion, MD;  Location: [place];  Service: Orthopedics;  Laterality: Left;   Patient Active Problem List   Diagnosis Date Noted   Fall    Closed left femoral fracture ([place]) 06/01/2019   Essential hypertension 06/01/2019   CKD (chronic kidney disease) stage 3, GFR 30-59 ml/min ([place]) 06/01/2019   Left knee injury 08/21/2012   Right foot pain 07/24/2012    PCP: Loraine Leriche., MD  REFERRING PROVIDER: Lorine Bears, NP  REFERRING DIAG:  M54.16 (ICD-10-CM) - Lumbar radiculopathy  M54.42,G89.29 (ICD-10-CM) - Chronic left-sided low back pain with left-sided sciatica  M51.16 (ICD-10-CM) - Intervertebral disc disorders with radiculopathy, lumbar region  M25.562 (ICD-10-CM) - Posterior left knee pain    THERAPY DIAG:  Radiculopathy, lumbar region  Muscle weakness (generalized)  Cramp and spasm  Unsteadiness on feet  ONSET DATE: 04/03/21  SUBJECTIVE:                                                                                                                                                                                            SUBJECTIVE STATEMENT: Pt reports a lot of stress last few days, close friend lost adult child, feels like stress is making her back worse.  On a "normal" day she feels like she has made good progress and steadier on feet.  Denies any new falls or LOB.      PERTINENT HISTORY:  Chronic left sided LBP with radicuopathy, scoliosis, heel lift  left, osteopenia, left femoral shaft fracture from mechanical fall in 2021, HTN, CKD   PAIN:  Are you having pain?  No: NPRS scale:4/10 Pain location: left side low back    PRECAUTIONS: Fall   FALLS:  Has patient fallen in last 6 months?  Near miss 05/25/21 see subjective; Near miss  05/10/21  when she lost her balance on her inclined driveway and husband caught her.  LIVING ENVIRONMENT: Lives with: lives with their spouse Lives in: House/apartment Stairs: Yes: Internal: 14 steps; on right going up and External: 3 steps; can reach both Has following equipment at home: None  OCCUPATION: retired  PLOF: [place] wants to improve her QOL, balance and decrease or get rid of sciatica   OBJECTIVE:   DIAGNOSTIC FINDINGS:  MRI from 2022 exhibits multi-level facet hypertrophy and mild broad-based disc bulge with a prominent left lateral disc osteophyte complex at L5-S1. No high grade spinal canal stenosis noted. Patient has history of multiple lumbar epidural steroid injections performed in Dr. Eric Form office, most recent was left L5 and S1 transforaminal epidural steroid injection performed on 03/18/2021 and reports significant relief for approximately 4 days. Patient has history of left femoral shaft fracture from mechanical fall in 2021  PATIENT SURVEYS:  FOTO 56 (predicted 57)  MUSCLE LENGTH:  Bil piriformis L>R, bil HS R>L, quads/HF WNL  POSTURE:  Depressed right shoulder, left convexity scoliosis, left hip ER in standing;  has left heel lift  PALPATION: L lateral HS distally and peroneals, L PSIS, R ITB; bil QL Spinal mobility: limited by tight lumbar muscles bil with pain L>R  LUMBAR ROM:   Active  A/PROM  05/12/2021  Flexion Hands to mid shin  Extension 75% feels unsteady  Right lateral flexion Limited 50%  Left lateral flexion WFL  Right rotation WNL  Left rotation WNL   (Blank rows = not tested)  LE ROM:  Bil hips WFL except IR/ER due to muscular tightness; bil knees WNL  LE MMT:  MMT Right 05/12/2021 Left 05/12/2021 Right 06/15/2021 Left 06/15/2021  Hip flexion 4+ 4+ 4+ 4+  Hip extension 4- 4- 4+ 4  Hip abduction 4+ 4+ 4+ 5  Hip adduction 5 4+ 5 5  Hip internal rotation      Hip external rotation   06/23/21 R glut med 3-/5 06/23/21 glut med 4+/5  Knee flexion 5 5    Knee extension 5 5    Ankle dorsiflexion 5 5    Ankle plantarflexion      Ankle inversion      Ankle eversion       (Blank rows = not tested)  LUMBAR SPECIAL TESTS:  Straight leg raise test: Positive and Slump test: Positive Left side  FUNCTIONAL TESTS:   6MWT 1,015 ft in 6 min, no rest, no c/o of fatigue 5 times sit to stand: 13.13 sec  06/15/21- 9.4 seconds BERG: 49/54 (05/20/21) DGI: 23/24 (05/20/21)   06/23/21 FGA 20/30 (<22/30 indicates increased risk of falls)  TODAY'S TREATMENT  07/26/21 Therapeutic Exercise: to improve strength and mobility.   Bike L2 x 6 min  Standing at counter for safety: Heel raises x 10, toe raises x 10, hip extension x 10 bil, hip abduction x 20 bil  Supine-  Bridges x 15 Single leg bridges x 10 bil  SLR x 10 bil  Prone- Leg extension 2 x 10 bil  Hamstring curls x 10 bil  Neuromuscular Reeducation: to improve balance and stability.  Single leg  stance in corner with chair, counting errors (touching wall, chair, or floor)  2  rounds 30 sec each foot.  Self Care- education on importance of regular exercise, maintaining progress, community programs - given information on senior center  programs and discussed [employer] and [employer].  Reviewed and reorganized HEP into sitting, laying down and standing exercises.  Manual Therapy: to decrease muscle spasm and pain and improve mobility.  STM/TPR L hamstring.    07/23/21 Therapeutic Exercise: Rec Bike L2x46mn Supine sciatic nerve glide 10x L figure 4 stretch x 30 sec L KTOS x 30 sec Bridges 2x10 with 3 sec hold SLR R/L x 10 Prone press up 3x10" Prone hip extension x 10 bil  07/15/21 Therapeutic Exercise: to improve strength and mobility.  Nustep L5 x 6 min Gait with cues for heel strike x 300'  Neuromuscular Reeducation: to improve balance and stability. SBA for safety throughout.  On Airex in corner for safety: Eyes open feet apart x 30 sec  Eyes open feet apart with head nods x 10 Eyes open feet apart with head turns x 10 Eyes closed feet apart x 30 sec Eyes closed feet apart with head nods x 10 Eyes closed feet apart with head turns x 10  Gait with head turns x 50' Gait wit ball toss x 100' Side step with ball toss x 50' each direction Gait with passing ball x 150' Balance board - frontal and sagittal planes.  Forward step with arm raise for stepping strategy x 10 bil  Side step with arm raise for stepping strategy x 10 bil   07/13/21 Therapeutic Exercise: Nustep L6x666m Knee flexion 15# x 10 Knee extension 10# x 10 Neuromuscular Re-ed: Standing on airex with horizontal and vertical head turns eyes fixed on object in front 10x  Standing toe clears 9' step from airex 2x10 Standing hip extension from ariex x 10 Standing hip abduction from airex x 10 Standing hip flexion from airex x 10    PATIENT EDUCATION:  Education details:    HEP update 07/26/21 Person educated: Patient Education method: Explanation, Demonstration, and Handouts Education comprehension: verbalized understanding and returned demonstration   HOME EXERCISE PROGRAM: Access Code: [email or web address] ASSESSMENT:  CLINICAL  IMPRESSION: Overall patient reports improvements in strength and steadiness.  Today focused on reviewing and progressing exercises, education on community programs to maintain progress and continue building strength.  Also worked on single leg stance in corner, counter errors - first round had 6 errors in 30 sec each side, second round only had 5 errors in 30 sec on each side.  Discussed how this exercise would challenged and improve balance, and also work on improving hip strength.  Organized exercises today into sitting, standing, and laying down, and how to keep exercises from feeling overwhelming.     OBJECTIVE IMPAIRMENTS decreased balance, decreased ROM, decreased strength, hypomobility, increased muscle spasms, impaired flexibility, postural dysfunction, and pain.   ACTIVITY LIMITATIONS  all ADLs are limited by pain .   PERSONAL FACTORS Time since onset of injury/illness/exacerbation and 3+ comorbidities: scoliosis, chronic lumbar radiculopathy, h/o left femoral fx  are also affecting patient's functional outcome.   GOALS: Goals reviewed with patient? Yes  SHORT TERM GOALS: Target date: 05/26/2021     Ind with initial HEP Baseline: Goal status: MET   2.  Complete functional balance assesment and set appropriate goals Baseline:  Goal status: MET  LONG TERM GOALS: Target date: 07/30/2021  extended to 07/30/2021  Ind with advanced  HEP and progressions Baseline:  Goal status: IN PROGRESS - met for current (07/23/21)  2.  Decreased pain and N/T in low back and left leg by >=50% with ADLS Baseline: 56% better; back still hurts with vacuuming Goal status: IN PROGRESS (40% improvement), 06/23/21- reports pain returning after medication change.  PT has helped some.    3.  Improved LE strength to 5/5 to improve functional movements Baseline:  Goal status: IN PROGRESS 06/23/21- weakness R glute, 2+/5 glut med  4. Improved 5x sit to stand to <= 12.6 seconds to decrease risk of  falls Baseline: 9.14 sec on 06/15/21 Goal status: MET  5. Improve FGA to > or = 24/30 to decrease risk of falls.  Baseline: 20/30 increased risk of falls Goal status: Initial  PLAN: PT FREQUENCY: 1-2x/week  PT DURATION: 6 weeks extended to 07/30/21  PLANNED INTERVENTIONS: Therapeutic exercises, Therapeutic activity, Neuromuscular re-education, Balance training, Gait training, Patient/Family education, Joint mobilization, Dry Needling, Electrical stimulation, Spinal mobilization, Cryotherapy, Moist heat, Ionotophoresis 5m/ml Dexamethasone, and Manual therapy.  PLAN FOR NEXT SESSION:  recheck all goals  ERennie Natter PT, DPT  07/26/2021, 4:30 PM

## 2021-07-28 ENCOUNTER — Encounter: Payer: Medicare Other | Admitting: Physical Therapy

## 2021-08-02 ENCOUNTER — Ambulatory Visit: Payer: Medicare Other

## 2021-08-02 DIAGNOSIS — M5416 Radiculopathy, lumbar region: Secondary | ICD-10-CM | POA: Diagnosis not present

## 2021-08-02 DIAGNOSIS — R252 Cramp and spasm: Secondary | ICD-10-CM

## 2021-08-02 DIAGNOSIS — R2681 Unsteadiness on feet: Secondary | ICD-10-CM

## 2021-08-02 DIAGNOSIS — M6281 Muscle weakness (generalized): Secondary | ICD-10-CM

## 2021-08-02 NOTE — Therapy (Addendum)
OUTPATIENT PHYSICAL THERAPY TREATMENT NOTE  PHYSICAL THERAPY DISCHARGE SUMMARY  Visits from Start of Care: 15  Current functional level related to goals / functional outcomes: Decreased risk of falls, with FGA improved to 25/30.  60% overall improvement with pain.  Met or close to meeting all goals.    Remaining deficits: Decreased LE strength    Education / Equipment: HEP  Plan: Patient agrees to discharge. Patient is being discharged due to meeting the stated rehab goals.  She was placed on 30 day hold on 08/02/21 based on progress and has not needed to return with in stated time frame.      Rennie Natter, PT, DPT 9:34 AM 09/08/2021    Patient Name: Julia Mora MRN: [id number] DOB:09-25-1950, 71 y.o., female Today's Date: 08/02/2021   PT End of Session - 08/02/21 1531     Visit Number 15    Date for PT Re-Evaluation 07/30/21    Authorization Type MCR/BCBS    Progress Note Due on Visit 25    PT Start Time 1450    PT Stop Time 1530    PT Time Calculation (min) 40 min    Activity Tolerance Patient tolerated treatment well    Behavior During Therapy [facility] for tasks assessed/performed                   Rationale for Evaluation and Treatment:  Rehabilitation     Past Medical History:  Diagnosis Date   CKD (chronic kidney disease) stage 3, GFR 30-59 ml/min (HCC)    Hypertension    Osteoporosis    Past Surgical History:  Procedure Laterality Date   ABDOMINAL HYSTERECTOMY     ANUS SURGERY     BLADDER SURGERY     FEMUR IM NAIL Left 06/02/2019   Procedure: INTRAMEDULLARY (IM) NAIL FEMORAL;  Surgeon: Newt Minion, MD;  Location: [place];  Service: Orthopedics;  Laterality: Left;   Patient Active Problem List   Diagnosis Date Noted   Fall    Closed left femoral fracture ([place]) 06/01/2019   Essential hypertension 06/01/2019   CKD (chronic kidney disease) stage 3, GFR 30-59 ml/min ([place]) 06/01/2019   Left knee injury 08/21/2012   Right foot pain  07/24/2012    PCP: Loraine Leriche., MD  REFERRING PROVIDER: Lorine Bears, NP  REFERRING DIAG:  M54.16 (ICD-10-CM) - Lumbar radiculopathy  M54.42,G89.29 (ICD-10-CM) - Chronic left-sided low back pain with left-sided sciatica  M51.16 (ICD-10-CM) - Intervertebral disc disorders with radiculopathy, lumbar region  M25.562 (ICD-10-CM) - Posterior left knee pain    THERAPY DIAG:  Radiculopathy, lumbar region  Muscle weakness (generalized)  Cramp and spasm  Unsteadiness on feet  ONSET DATE: 04/03/21  SUBJECTIVE:  SUBJECTIVE STATEMENT: Pt reports that sciatica has been acting up, not every day and feels like when she  loses sleep it makes it worse, however is on board with wrapping up PT.   PERTINENT HISTORY:  Chronic left sided LBP with radicuopathy, scoliosis, heel lift left, osteopenia, left femoral shaft fracture from mechanical fall in 2021, HTN, CKD   PAIN:  Are you having pain?  No: NPRS scale: 3/10 Pain location: left side low back    PRECAUTIONS: Fall   FALLS:  Has patient fallen in last 6 months?  Near miss 05/25/21 see subjective; Near miss  05/10/21  when she lost her balance on her inclined driveway and husband caught her.  LIVING ENVIRONMENT: Lives with: lives with their spouse Lives in: House/apartment Stairs: Yes: Internal: 14 steps; on right going up and External: 3 steps; can reach both Has following equipment at home: None  OCCUPATION: retired  PLOF: [place] wants to improve her QOL, balance and decrease or get rid of sciatica   OBJECTIVE:   DIAGNOSTIC FINDINGS:  MRI from 2022 exhibits multi-level facet hypertrophy and mild broad-based disc bulge with a prominent left lateral disc osteophyte complex at L5-S1. No high grade spinal canal  stenosis noted. Patient has history of multiple lumbar epidural steroid injections performed in Dr. Eric Form office, most recent was left L5 and S1 transforaminal epidural steroid injection performed on 03/18/2021 and reports significant relief for approximately 4 days. Patient has history of left femoral shaft fracture from mechanical fall in 2021  PATIENT SURVEYS:  FOTO 56 (predicted 57)  MUSCLE LENGTH:  Bil piriformis L>R, bil HS R>L, quads/HF WNL  POSTURE:  Depressed right shoulder, left convexity scoliosis, left hip ER in standing; has left heel lift  PALPATION: L lateral HS distally and peroneals, L PSIS, R ITB; bil QL Spinal mobility: limited by tight lumbar muscles bil with pain L>R  LUMBAR ROM:   Active  A/PROM  05/12/2021  Flexion Hands to mid shin  Extension 75% feels unsteady  Right lateral flexion Limited 50%  Left lateral flexion WFL  Right rotation WNL  Left rotation WNL   (Blank rows = not tested)  LE ROM:  Bil hips WFL except IR/ER due to muscular tightness; bil knees WNL  LE MMT:  MMT Right 05/12/2021 Left 05/12/2021 Right 06/15/2021 Left 06/15/2021 Right 08/02/21 Left 08/02/21  Hip flexion 4+ 4+ 4+ 4+ 4+ 4+  Hip extension 4- 4- 4+ 4    Hip abduction 4+ 4+ 4+ _0 Hip adduction 5 4+ _1 Hip internal rotation        Hip external rotation   06/23/21 R glut med 3-/5 06/23/21 glut med 4+/5 4+ 4+  Knee flexion 5 5      Knee extension 5 5      Ankle dorsiflexion 5 5      Ankle plantarflexion        Ankle inversion        Ankle eversion         (Blank rows = not tested)  LUMBAR SPECIAL TESTS:  Straight leg raise test: Positive and Slump test: Positive Left side  FUNCTIONAL TESTS:   6MWT 1,015 ft in 6 min, no rest, no c/o of fatigue 5 times sit to stand: 13.13 sec  06/15/21- 9.4 seconds BERG: 49/54 (05/20/21) DGI: 23/24 (05/20/21)   06/23/21 FGA 20/30 (<22/30 indicates increased risk of falls)  TODAY'S TREATMENT  08/02/21 Therapeutic  Exercise: [employer]  L3x39mn  Therapeutic Activity: Assessed LE strength FGA assessed: 25/30  07/26/21 Therapeutic Exercise: to improve strength and mobility.   Bike L2 x 6 min  Standing at counter for safety: Heel raises x 10, toe raises x 10, hip extension x 10 bil, hip abduction x 20 bil  Supine-  Bridges x 15 Single leg bridges x 10 bil  SLR x 10 bil  Prone- Leg extension 2 x 10 bil  Hamstring curls x 10 bil  Neuromuscular Reeducation: to improve balance and stability.  Single leg stance in corner with chair, counting errors (touching wall, chair, or floor)  2  rounds 30 sec each foot.  Self Care- education on importance of regular exercise, maintaining progress, community programs - given information on senior center programs and discussed [employer] and [employer].  Reviewed and reorganized HEP into sitting, laying down and standing exercises.  Manual Therapy: to decrease muscle spasm and pain and improve mobility.  STM/TPR L hamstring.    07/23/21 Therapeutic Exercise: Rec Bike L2x682m Supine sciatic nerve glide 10x L figure 4 stretch x 30 sec L KTOS x 30 sec Bridges 2x10 with 3 sec hold SLR R/L x 10 Prone press up 3x10" Prone hip extension x 10 bil  07/15/21 Therapeutic Exercise: to improve strength and mobility.  Nustep L5 x 6 min Gait with cues for heel strike x 300'  Neuromuscular Reeducation: to improve balance and stability. SBA for safety throughout.  On Airex in corner for safety: Eyes open feet apart x 30 sec  Eyes open feet apart with head nods x 10 Eyes open feet apart with head turns x 10 Eyes closed feet apart x 30 sec Eyes closed feet apart with head nods x 10 Eyes closed feet apart with head turns x 10  Gait with head turns x 50' Gait wit ball toss x 100' Side step with ball toss x 50' each direction Gait with passing ball x 150' Balance board - frontal and sagittal planes.  Forward step with arm raise for stepping strategy x 10 bil  Side step with  arm raise for stepping strategy x 10 bil   07/13/21 Therapeutic Exercise: Nustep L6x6m68mKnee flexion 15# x 10 Knee extension 10# x 10 Neuromuscular Re-ed: Standing on airex with horizontal and vertical head turns eyes fixed on object in front 10x  Standing toe clears 9' step from airex 2x10 Standing hip extension from ariex x 10 Standing hip abduction from airex x 10 Standing hip flexion from airex x 10    PATIENT EDUCATION:  Education details: Reviewed HEP Person educated: Patient Education method: Explanation, Demonstration, and Handouts Education comprehension: verbalized understanding and returned demonstration   HOME EXERCISE PROGRAM: Access Code: [email or web address]:  CLINICAL IMPRESSION:  Pt demonstrates improvement with FGA score, now 25/30 indicating no fall risk at this point. LE strength has improved but not quite reached the 5/5 target for all muscle groups. Pt reports 60% improvement in overall pain. Reviewed HEP, gave resistance bands as a way to progress exercises and providing clarity with any exercises needed for D/C. As of now she has met all goals except for LTG#3 for LE strength. Pt is pleased with progress and is ok to go on 30 day hold at this time.   OBJECTIVE IMPAIRMENTS decreased balance, decreased ROM, decreased strength, hypomobility, increased muscle spasms, impaired flexibility, postural dysfunction, and pain.   ACTIVITY LIMITATIONS  all ADLs are limited by pain .   PERSONAL FACTORS Time since onset of  injury/illness/exacerbation and 3+ comorbidities: scoliosis, chronic lumbar radiculopathy, h/o left femoral fx  are also affecting patient's functional outcome.   GOALS: Goals reviewed with patient? Yes  SHORT TERM GOALS: Target date: 05/26/2021     Ind with initial HEP Baseline: Goal status: MET   2.  Complete functional balance assesment and set appropriate goals Baseline:  Goal status: MET  LONG TERM GOALS: Target date: 07/30/2021   extended to 07/30/2021  Ind with advanced HEP and progressions Baseline:  Goal status: MET - (08/02/21)  2.  Decreased pain and N/T in low back and left leg by >=50% with ADLS Baseline: 40% better; back still hurts with vacuuming Goal status: MET (60% improvement) - 08/02/21  3.  Improved LE strength to 5/5 to improve functional movements Baseline:  Goal status: NOT MET 08/02/21  4. Improved 5x sit to stand to <= 12.6 seconds to decrease risk of falls Baseline: 9.14 sec on 06/15/21 Goal status: MET  5. Improve FGA to > or = 24/30 to decrease risk of falls.  Baseline: 20/30 increased risk of falls Goal status: MET  PLAN: PT FREQUENCY: 1-2x/week  PT DURATION: 6 weeks extended to 07/30/21  PLANNED INTERVENTIONS: Therapeutic exercises, Therapeutic activity, Neuromuscular re-education, Balance training, Gait training, Patient/Family education, Joint mobilization, Dry Needling, Electrical stimulation, Spinal mobilization, Cryotherapy, Moist heat, Ionotophoresis 87m/ml Dexamethasone, and Manual therapy.  PLAN FOR NEXT SESSION:  30 day hold  BArtist Pais PTA 08/02/2021, 3:31 PM

## 2021-08-11 ENCOUNTER — Other Ambulatory Visit: Payer: Self-pay | Admitting: Physical Medicine and Rehabilitation

## 2021-08-11 ENCOUNTER — Telehealth: Payer: Self-pay | Admitting: Physical Medicine and Rehabilitation

## 2021-08-11 MED ORDER — PREGABALIN 50 MG PO CAPS: ORAL_CAPSULE | ORAL | 1 refills | Status: DC

## 2021-08-11 NOTE — Telephone Encounter (Signed)
Patient called in stating she ran out of  Pregablin before her appt with Aundra Millet on the 15th and she needs a refill please advise

## 2021-08-17 ENCOUNTER — Encounter: Payer: Self-pay | Admitting: Physical Medicine and Rehabilitation

## 2021-08-17 ENCOUNTER — Ambulatory Visit (INDEPENDENT_AMBULATORY_CARE_PROVIDER_SITE_OTHER): Payer: Medicare Other | Admitting: Physical Medicine and Rehabilitation

## 2021-08-17 VITALS — BP 123/80 | HR 67

## 2021-08-17 DIAGNOSIS — M5442 Lumbago with sciatica, left side: Secondary | ICD-10-CM

## 2021-08-17 DIAGNOSIS — M5116 Intervertebral disc disorders with radiculopathy, lumbar region: Secondary | ICD-10-CM | POA: Diagnosis not present

## 2021-08-17 DIAGNOSIS — M4726 Other spondylosis with radiculopathy, lumbar region: Secondary | ICD-10-CM

## 2021-08-17 DIAGNOSIS — M47816 Spondylosis without myelopathy or radiculopathy, lumbar region: Secondary | ICD-10-CM | POA: Diagnosis not present

## 2021-08-17 DIAGNOSIS — M5416 Radiculopathy, lumbar region: Secondary | ICD-10-CM

## 2021-08-17 DIAGNOSIS — G8929 Other chronic pain: Secondary | ICD-10-CM | POA: Diagnosis not present

## 2021-08-17 NOTE — Progress Notes (Unsigned)
Julia Mora - 71 y.o. female MRN [id number]  Date of birth: Jan 04, 1951  Office Visit Note: Visit Date: 08/17/2021 PCP: Loraine Leriche., MD Referred by: Loraine Leriche.,*  Subjective: Chief Complaint  Patient presents with   Left Hip - Pain   HPI: Julia Mora is a 71 y.o. female who comes in today for evaluation of chronic left sided lower back pain radiating to buttock, posterolateral thigh down to knee. Patient reports symptoms have been ongoing for several months and are exacerbated by movement and activity. Patient recently completed regimen of physical therapy at [place] and reports some relief of pain with these treatments. Patient's lumbar MRI from 2022 exhibits multi-level facet hypertrophy and mild broad-based disc bulge with a prominent left lateral disc osteophyte complex at L5-S1. No high grade spinal canal stenosis noted. Patient has had multiple lumbar epidural steroid injections performed in our office, most recent was left L5 and S1 transforaminal epidural steroid injection on 03/18/2021 that provided some relief for 4 days. Patient has been taking Lyrica 50 mg twice a day now for several months and reports mild bilateral ankle swelling. Patient states bilateral ankle swelling is intermittent and does not occur everyday, however this is something she noticed recently. Patient states Lyrica has helped alleviate her pain significantly more than any other medications/treatments including injections. Patient would like to talk about medication options today, states she is planning to travel to [place] in [place]. Patient did inform me that her recent bone density testing was normal, states she is currently receiving Prolia injections.  Patient denies focal weakness, numbness and tingling. Patient denies recent trauma or falls.    Review of Systems  Musculoskeletal:  Positive for back pain.  Neurological:  Negative for  tingling, sensory change, focal weakness and weakness.  All other systems reviewed and are negative.  Otherwise per HPI.  Assessment & Plan: Visit Diagnoses:    ICD-10-CM   1. Chronic left-sided low back pain with left-sided sciatica  M54.42    G89.29     2. Lumbar radiculopathy  M54.16     3. Intervertebral disc disorders with radiculopathy, lumbar region  M51.16     4. Facet hypertrophy of lumbar region  M47.816        Plan: Findings:  Chronic left sided lower back pain radiating to buttock, posterolateral thigh down to knee. Patient continues with conservative therapies such as home exercise regimen, rest and use of medications. Patients clinical presentation and exam are consistent with lumbar radiculopathy, specifically left L5 and S1 nerve patterns. I did speak with patient about medication management in detail today, we do not feel adverse side effects are heart related. We are going to continue with Lyrica 50 mg twice a day and schedule a 4 week follow up appointment. Patient instructed to let us know if bilateral ankle swelling worsens, we would consider dropping her dose back to 50 mg once a day, could also look at 75 mg once a day as she does have chronic kidney disease. No red flag symptoms noted upon exam today.     Meds & Orders: No orders of the defined types were placed in this encounter.  No orders of the defined types were placed in this encounter.   Follow-up: Return for 4 week follow up.   Procedures: No procedures performed      Clinical History: MRI LUMBAR SPINE WITHOUT CONTRAST     TECHNIQUE:  Multiplanar, multisequence MR imaging  of the lumbar spine was  performed. No intravenous contrast was administered.     COMPARISON:  None.     FINDINGS:  Segmentation:  Standard.     Alignment: Levoscoliosis of the thoracolumbar spine. 2 mm  retrolisthesis of L2 on L3. Minimal grade 1 anterolisthesis of L4 on  L5 and L5 on S1 secondary to facet disease.      Vertebrae: No acute fracture, evidence of discitis, or aggressive  bone lesion.     Conus medullaris and cauda equina: Conus extends to the T12-L1  level. Conus and cauda equina appear normal.     Paraspinal and other soft tissues: No acute paraspinal abnormality.     Disc levels:     Disc spaces: Degenerative disease with disc height loss at L2-3 and  L3-4. Mild disc height loss at L5-S1.     T12-L1: No disc protrusion, foraminal stenosis or central canal  stenosis.     L1-L2: Mild broad-based disc bulge. No foraminal or central canal  stenosis.     L2-L3: Mild broad-based disc bulge eccentric towards the right. Mild  bilateral facet arthropathy. Right lateral recess stenosis. No  foraminal or central canal stenosis.     L3-L4: Broad-based disc bulge. No foraminal or central canal  stenosis.     L4-L5: Broad-based disc bulge. Mild bilateral facet arthropathy with  right facet effusion. No foraminal or central canal stenosis.     L5-S1: Mild broad-based disc bulge with a prominent left lateral  disc osteophyte complex. Mild left facet arthropathy. No foraminal  or central canal stenosis.     IMPRESSION:  1. Lumbar spine spondylosis as described above.  2. No acute osseous injury of the lumbar spine.        Electronically Signed    By: Elige Ko M.D.    On: 11/15/2020 08:18   She reports that she has never smoked. She has never used smokeless tobacco. No results for input(s): "HGBA1C", "LABURIC" in the last 8760 hours.  Objective:  VS:  HT:    WT:   BMI:     BP:123/80  HR:67bpm  TEMP: ( )  RESP:  Physical Exam Vitals and nursing note reviewed.  HENT:     Head: Normocephalic and atraumatic.     Right Ear: External ear normal.     Left Ear: External ear normal.     Nose: Nose normal.     Mouth/Throat:     Mouth: Mucous membranes are moist.  Eyes:     Extraocular Movements: Extraocular movements intact.  Cardiovascular:     Rate and Rhythm: Normal rate.      Pulses: Normal pulses.  Pulmonary:     Effort: Pulmonary effort is normal.  Abdominal:     General: Abdomen is flat. There is no distension.  Musculoskeletal:        General: Tenderness present.     Cervical back: Normal range of motion.     Comments: Pt rises from seated position to standing without difficulty. Good lumbar range of motion. Strong distal strength without clonus, no pain upon palpation of greater trochanters. Dysesthesias noted to left L5 and S1 dermatomes. Sensation intact bilaterally. Walks independently, gait steady.      Skin:    General: Skin is warm and dry.     Capillary Refill: Capillary refill takes less than 2 seconds.  Neurological:     General: No focal deficit present.     Mental Status: She is alert and oriented to  person, place, and time.  Psychiatric:        Mood and Affect: Mood normal.        Behavior: Behavior normal.     Ortho Exam  Imaging: No results found.  Past Medical/Family/Surgical/Social History: Medications & Allergies reviewed per EMR, new medications updated. Patient Active Problem List   Diagnosis Date Noted   Fall    Closed left femoral fracture (HCC) 06/01/2019   Essential hypertension 06/01/2019   CKD (chronic kidney disease) stage 3, GFR 30-59 ml/min (HCC) 06/01/2019   Left knee injury 08/21/2012   Right foot pain 07/24/2012   Past Medical History:  Diagnosis Date   CKD (chronic kidney disease) stage 3, GFR 30-59 ml/min (HCC)    Hypertension    Osteoporosis    Family History  Problem Relation Age of Onset   Heart attack Father    Diabetes Neg Hx    Hyperlipidemia Neg Hx    Hypertension Neg Hx    Past Surgical History:  Procedure Laterality Date   ABDOMINAL HYSTERECTOMY     ANUS SURGERY     BLADDER SURGERY     FEMUR IM NAIL Left 06/02/2019   Procedure: INTRAMEDULLARY (IM) NAIL FEMORAL;  Surgeon: Nadara Mustard, MD;  Location: [facility];  Service: Orthopedics;  Laterality: Left;   Social History    Occupational History   Not on file  Tobacco Use   Smoking status: Never   Smokeless tobacco: Never  Vaping Use   Vaping Use: Never used  Substance and Sexual Activity   Alcohol use: No   Drug use: No   Sexual activity: Not on file

## 2021-08-17 NOTE — Progress Notes (Unsigned)
Pt state pain posterior to her left thigh. Pt state sitting makes the pain worse. Pt state she take [place] meds to help ease her pain.   Numeric Pain Rating Scale and Functional Assessment Average Pain 5 Pain Right Now 3 My pain is intermittent, sharp, burning, dull, tingling, and aching Pain is worse with: sitting and some activites Pain improves with: rest, medication, and injections   In the last MONTH (on 0-10 scale) has pain interfered with the following?  1. General activity like being  able to carry out your everyday physical activities such as walking, climbing stairs, carrying groceries, or moving a chair?  Rating(5)  2. Relation with others like being able to carry out your usual social activities and roles such as  activities at home, at work and in your community. Rating(6)  3. Enjoyment of life such that you have  been bothered by emotional problems such as feeling anxious, depressed or irritable?  Rating(7)

## 2021-08-18 ENCOUNTER — Ambulatory Visit: Payer: Medicare Other | Admitting: Physical Medicine and Rehabilitation

## 2021-09-28 ENCOUNTER — Encounter: Payer: Self-pay | Admitting: Physical Medicine and Rehabilitation

## 2021-09-28 ENCOUNTER — Ambulatory Visit (INDEPENDENT_AMBULATORY_CARE_PROVIDER_SITE_OTHER): Payer: Medicare Other | Admitting: Physical Medicine and Rehabilitation

## 2021-09-28 DIAGNOSIS — M5416 Radiculopathy, lumbar region: Secondary | ICD-10-CM | POA: Diagnosis not present

## 2021-09-28 DIAGNOSIS — M5116 Intervertebral disc disorders with radiculopathy, lumbar region: Secondary | ICD-10-CM

## 2021-09-28 DIAGNOSIS — G8929 Other chronic pain: Secondary | ICD-10-CM

## 2021-09-28 DIAGNOSIS — M5442 Lumbago with sciatica, left side: Secondary | ICD-10-CM

## 2021-09-28 DIAGNOSIS — M47816 Spondylosis without myelopathy or radiculopathy, lumbar region: Secondary | ICD-10-CM | POA: Diagnosis not present

## 2021-09-28 NOTE — Progress Notes (Unsigned)
Previous injections did not help Switched from Gabapentin to Lyrica and it helps.

## 2021-09-28 NOTE — Progress Notes (Unsigned)
Julia Mora - 71 y.o. female MRN [id number]  Date of birth: 1950/08/09  Office Visit Note: Visit Date: 09/28/2021 PCP: Loraine Leriche., MD Referred by: Loraine Leriche.,*  Subjective: Chief Complaint  Patient presents with   Lower Back - Follow-up   HPI: Julia Mora is a 71 y.o. female who comes in today for evaluation of chronic left sided lower back pain radiating to buttock, posterolateral thigh down to knee. Patient reports symptoms have been ongoing for several months and are exacerbated by movement and activity. She describes her pain as sore and aching sensation, currently rates as 5 out of 10. Some relief with home exercise regimen, rest and use of medications. States she does take extra strength Tylenol as needed, mostly at bedtime. History of formal physical therapy at [place], reports good relief of pain with these treatments. Patient continues with Lyrica 50 mg twice a day, states this medication has significantly decreased her pain. Does report intermittent ankle swelling after being on her feet. Patient's lumbar MRI from 2022 exhibits multi-level facet hypertrophy and mild broad-based disc bulge with a prominent left lateral disc osteophyte complex at L5-S1. No high grade spinal canal stenosis noted. Patient has had multiple lumbar epidural steroid injections performed in our office, most recent was left L5 and S1 transforaminal epidural steroid injection on 03/18/2021 that provided some relief for 4 days. Patient states she has good and bad days but is now able to better manage her pain. Patient and [place] plans [place]. Patient denies focal weakness, numbness and tingling. Patient denies recent trauma or falls.    Review of Systems  Musculoskeletal:  Positive for back pain.  Neurological:  Negative for tingling, sensory change, focal weakness and weakness.  All other systems reviewed and  are negative.  Otherwise per HPI.  Assessment & Plan: Visit Diagnoses:    ICD-10-CM   1. Chronic left-sided low back pain with left-sided sciatica  M54.42    G89.29     2. Lumbar radiculopathy  M54.16     3. Intervertebral disc disorders with radiculopathy, lumbar region  M51.16     4. Facet hypertrophy of lumbar region  M47.816        Plan: Findings:  Chronic left sided lower back pain radiating to buttock, posterolateral thigh down to knee. She is now able to better manage pain, she continues with conservative therapies as needed. Patients clinical presentation and exam are consistent lumbar radiculopathy, specifically left L5 and S1 nerve patterns. Next step is to continue to monitor. Patient instructed to let us know if her pain worsens/changes in nature. We will continue with Lyrica 50 mg twice a day, patient instructed to let us know if bilateral ankle swelling becomes more consistent. I did encourage patient to continue with home exercise regimen, we also discussed joining gym and/or participating in exercise classes like silver sneakers. We will follow up in approximately 6 months. No red flag symptoms noted upon exam today.     Meds & Orders: No orders of the defined types were placed in this encounter.  No orders of the defined types were placed in this encounter.   Follow-up: Return if symptoms worsen or fail to improve.   Procedures: No procedures performed      Clinical History: MRI LUMBAR SPINE WITHOUT CONTRAST     TECHNIQUE:  Multiplanar, multisequence MR imaging of the lumbar spine was  performed. No intravenous contrast  was administered.     COMPARISON:  None.     FINDINGS:  Segmentation:  Standard.     Alignment: Levoscoliosis of the thoracolumbar spine. 2 mm  retrolisthesis of L2 on L3. Minimal grade 1 anterolisthesis of L4 on  L5 and L5 on S1 secondary to facet disease.     Vertebrae: No acute fracture, evidence of discitis, or aggressive  bone  lesion.     Conus medullaris and cauda equina: Conus extends to the T12-L1  level. Conus and cauda equina appear normal.     Paraspinal and other soft tissues: No acute paraspinal abnormality.     Disc levels:     Disc spaces: Degenerative disease with disc height loss at L2-3 and  L3-4. Mild disc height loss at L5-S1.     T12-L1: No disc protrusion, foraminal stenosis or central canal  stenosis.     L1-L2: Mild broad-based disc bulge. No foraminal or central canal  stenosis.     L2-L3: Mild broad-based disc bulge eccentric towards the right. Mild  bilateral facet arthropathy. Right lateral recess stenosis. No  foraminal or central canal stenosis.     L3-L4: Broad-based disc bulge. No foraminal or central canal  stenosis.     L4-L5: Broad-based disc bulge. Mild bilateral facet arthropathy with  right facet effusion. No foraminal or central canal stenosis.     L5-S1: Mild broad-based disc bulge with a prominent left lateral  disc osteophyte complex. Mild left facet arthropathy. No foraminal  or central canal stenosis.     IMPRESSION:  1. Lumbar spine spondylosis as described above.  2. No acute osseous injury of the lumbar spine.        Electronically Signed    By: Kathreen Devoid M.D.    On: 11/15/2020 08:18   She reports that she has never smoked. She has never used smokeless tobacco. No results for input(s): "HGBA1C", "LABURIC" in the last 8760 hours.  Objective:  VS:  HT:    WT:   BMI:     BP:   HR: bpm  TEMP: ( )  RESP:  Physical Exam Vitals and nursing note reviewed.  HENT:     Head: Normocephalic and atraumatic.     Right Ear: External ear normal.     Left Ear: External ear normal.     Nose: Nose normal.     Mouth/Throat:     Mouth: Mucous membranes are moist.  Eyes:     Extraocular Movements: Extraocular movements intact.  Cardiovascular:     Rate and Rhythm: Normal rate.     Pulses: Normal pulses.  Pulmonary:     Effort: Pulmonary effort is  normal.  Abdominal:     General: Abdomen is flat. There is no distension.  Musculoskeletal:        General: Tenderness present.     Cervical back: Normal range of motion.     Comments: Pt rises from seated position to standing without difficulty. Good lumbar range of motion. Strong distal strength without clonus, no pain upon palpation of greater trochanters. Dysesthesias noted to left L5 and S1 dermatomes. Sensation intact bilaterally. Walks independently, gait steady.   Skin:    General: Skin is warm and dry.     Capillary Refill: Capillary refill takes less than 2 seconds.  Neurological:     General: No focal deficit present.     Mental Status: She is alert and oriented to person, place, and time.  Psychiatric:  Mood and Affect: Mood normal.        Behavior: Behavior normal.     Ortho Exam  Imaging: No results found.  Past Medical/Family/Surgical/Social History: Medications & Allergies reviewed per EMR, new medications updated. Patient Active Problem List   Diagnosis Date Noted   Fall    Closed left femoral fracture ([place]) 06/01/2019   Essential hypertension 06/01/2019   CKD (chronic kidney disease) stage 3, GFR 30-59 ml/min ([facility]CC) 06/01/2019   Left knee injury 08/21/2012   Right foot pain 07/24/2012   Past Medical History:  Diagnosis Date   CKD (chronic kidney disease) stage 3, GFR 30-59 ml/min (HCC)    Hypertension    Osteoporosis    Family History  Problem Relation Age of Onset   Heart attack Father    Diabetes Neg Hx    Hyperlipidemia Neg Hx    Hypertension Neg Hx    Past Surgical History:  Procedure Laterality Date   ABDOMINAL HYSTERECTOMY     ANUS SURGERY     BLADDER SURGERY     FEMUR IM NAIL Left 06/02/2019   Procedure: INTRAMEDULLARY (IM) NAIL FEMORAL;  Surgeon: Newt Minion, MD;  Location: [facility];  Service: Orthopedics;  Laterality: Left;   Social History   Occupational History   Not on file  Tobacco Use   Smoking status: Never   Smokeless  tobacco: Never  Vaping Use   Vaping Use: Never used  Substance and Sexual Activity   Alcohol use: No   Drug use: No   Sexual activity: Not on file

## 2021-10-08 ENCOUNTER — Other Ambulatory Visit: Payer: Self-pay | Admitting: Physical Medicine and Rehabilitation

## 2021-10-12 ENCOUNTER — Telehealth: Payer: Self-pay | Admitting: Physical Medicine and Rehabilitation

## 2021-10-12 ENCOUNTER — Other Ambulatory Visit: Payer: Self-pay | Admitting: Physical Medicine and Rehabilitation

## 2021-10-12 MED ORDER — PREGABALIN 50 MG PO CAPS: ORAL_CAPSULE | ORAL | 1 refills | Status: DC

## 2021-10-12 NOTE — Telephone Encounter (Signed)
Pt called requesting a refill of pregabalin. Please send to pharmacy on file. Pt states to be leaving for vacation Monday. Pt phone number is [phone].

## 2021-10-14 ENCOUNTER — Encounter: Payer: Self-pay | Admitting: Hematology & Oncology

## 2021-10-14 ENCOUNTER — Other Ambulatory Visit: Payer: Self-pay

## 2021-10-14 ENCOUNTER — Inpatient Hospital Stay: Payer: Medicare Other | Attending: Hematology & Oncology

## 2021-10-14 ENCOUNTER — Inpatient Hospital Stay (HOSPITAL_BASED_OUTPATIENT_CLINIC_OR_DEPARTMENT_OTHER): Payer: Medicare Other | Admitting: Hematology & Oncology

## 2021-10-14 VITALS — BP 142/80 | HR 83 | Temp 99.1°F | Resp 18 | Ht 59.0 in | Wt 121.8 lb

## 2021-10-14 DIAGNOSIS — I129 Hypertensive chronic kidney disease with stage 1 through stage 4 chronic kidney disease, or unspecified chronic kidney disease: Secondary | ICD-10-CM | POA: Insufficient documentation

## 2021-10-14 DIAGNOSIS — D649 Anemia, unspecified: Secondary | ICD-10-CM | POA: Diagnosis present

## 2021-10-14 DIAGNOSIS — N183 Chronic kidney disease, stage 3 unspecified: Secondary | ICD-10-CM | POA: Insufficient documentation

## 2021-10-14 DIAGNOSIS — D72819 Decreased white blood cell count, unspecified: Secondary | ICD-10-CM

## 2021-10-14 LAB — CMP (CANCER CENTER ONLY)
ALT: 13 U/L (ref 0–44)
AST: 31 U/L (ref 15–41)
Albumin: 4.6 g/dL (ref 3.5–5.0)
Alkaline Phosphatase: 33 U/L — ABNORMAL LOW (ref 38–126)
Anion gap: 9 (ref 5–15)
BUN: 27 mg/dL — ABNORMAL HIGH (ref 8–23)
CO2: 26 mmol/L (ref 22–32)
Calcium: 10.5 mg/dL — ABNORMAL HIGH (ref 8.9–10.3)
Chloride: 101 mmol/L (ref 98–111)
Creatinine: 1.42 mg/dL — ABNORMAL HIGH (ref 0.44–1.00)
GFR, Estimated: 40 mL/min — ABNORMAL LOW (ref >60)
Glucose, Bld: 113 mg/dL — ABNORMAL HIGH (ref 70–99)
Potassium: 4.5 mmol/L (ref 3.5–5.1)
Sodium: 136 mmol/L (ref 135–145)
Total Bilirubin: 0.5 mg/dL (ref 0.3–1.2)
Total Protein: 7.6 g/dL (ref 6.5–8.1)

## 2021-10-14 LAB — CBC WITH DIFFERENTIAL (CANCER CENTER ONLY)
Abs Immature Granulocytes: 0.02 10*3/uL (ref 0.00–0.07)
Basophils Absolute: 0 10*3/uL (ref 0.0–0.1)
Basophils Relative: 0 %
Eosinophils Absolute: 0 10*3/uL (ref 0.0–0.5)
Eosinophils Relative: 0 %
HCT: 37.2 % (ref 36.0–46.0)
Hemoglobin: 12.1 g/dL (ref 12.0–15.0)
Immature Granulocytes: 0 %
Lymphocytes Relative: 5 %
Lymphs Abs: 0.3 10*3/uL — ABNORMAL LOW (ref 0.7–4.0)
MCH: 28.5 pg (ref 26.0–34.0)
MCHC: 32.5 g/dL (ref 30.0–36.0)
MCV: 87.5 fL (ref 80.0–100.0)
Monocytes Absolute: 0.5 10*3/uL (ref 0.1–1.0)
Monocytes Relative: 7 %
Neutro Abs: 5.3 10*3/uL (ref 1.7–7.7)
Neutrophils Relative %: 88 %
Platelet Count: 229 10*3/uL (ref 150–400)
RBC: 4.25 MIL/uL (ref 3.87–5.11)
RDW: 14.6 % (ref 11.5–15.5)
WBC Count: 6.1 10*3/uL (ref 4.0–10.5)
nRBC: 0 % (ref 0.0–0.2)

## 2021-10-14 LAB — VITAMIN B12: Vitamin B-12: 768 pg/mL (ref 180–914)

## 2021-10-14 LAB — FERRITIN: Ferritin: 54 ng/mL (ref 11–307)

## 2021-10-14 LAB — RETICULOCYTES
Immature Retic Fract: 9.9 % (ref 2.3–15.9)
RBC.: 4.24 MIL/uL (ref 3.87–5.11)
Retic Count, Absolute: 76.7 10*3/uL (ref 19.0–186.0)
Retic Ct Pct: 1.8 % (ref 0.4–3.1)

## 2021-10-14 LAB — SAVE SMEAR(SSMR), FOR PROVIDER SLIDE REVIEW

## 2021-10-14 NOTE — Progress Notes (Signed)
Referral MD  Reason for Referral: Transient leukopenia and anemia; chronic renal insufficiency  Chief Complaint  Patient presents with   Follow-up  : My white blood cells are low and I have anemia.  HPI: Julia Mora is a very charming 71 year old white female.  She comes in with her husband.  He is a retired Company secretary.  She is retired Education officer, [employer].  She is followed by Dr. Idamae Schuller.  As always, Dr. Amalia Hailey is very thorough.  She has noted that Julia Mora has had some slight leukopenia and anemia.  Going back to August 2022, her white cell count was 5.8.  Her hemoglobin is 12.1.  Platelet count 349,000.  MCV was 86.  In August 2023, her white cell count was 4000.  Hemoglobin 11.8.  Platelet count 279,000.  In late August of this year, her white cell count was 3.6.  Her hemoglobin was 12.2..  Most recently on 09/28/2021, her white count 3.1.  Hemoglobin 11.3.  Platelet count 308,000.  MCV was 87.  She has had no problems with infections.  She has had no change in medications.  There has been no rashes.  She has had no swollen lymph nodes.  She has had no change in bowel or bladder habits.  She is up-to-date with all of her screening studies.  As far she knows, there is no history of blood problems in the family.  She has had no obvious bleeding.  There is been no fever.  She has had no swollen lymph nodes.  She is not a vegetarian.  Her appetite has been pretty good.  She would, who was referred to the [facility] so that we could evaluate this leukopenia.  She used to smoke.  I think she stopped 30 years ago.  She has rare alcohol use.  Overall, I would have to say that her performance status is ECOG 1.   Past Medical History:  Diagnosis Date   CKD (chronic kidney disease) stage 3, GFR 30-59 ml/min (HCC)    Hypertension    Osteoporosis   :   Past Surgical History:  Procedure Laterality Date   ABDOMINAL HYSTERECTOMY     ANUS SURGERY      BLADDER SURGERY     FEMUR IM NAIL Left 06/02/2019   Procedure: INTRAMEDULLARY (IM) NAIL FEMORAL;  Surgeon: Newt Minion, MD;  Location: [facility];  Service: Orthopedics;  Laterality: Left;  :   Current Outpatient Medications:    acetaminophen (TYLENOL) 500 MG tablet, Take 1 tablet (500 mg total) by mouth every 6 (six) hours as needed for mild pain., Disp: 30 tablet, Rfl: 0   amLODipine (NORVASC) 5 MG tablet, Take 5 mg by mouth daily., Disp: , Rfl:    Cholecalciferol (VITAMIN D3) 50 MCG (2000 UT) TABS, Take 2,000 Units by mouth daily., Disp: , Rfl:    fenofibrate (TRICOR) 145 MG tablet, Take 145 mg by mouth daily., Disp: , Rfl:    fexofenadine (ALLEGRA) 180 MG tablet, Take 180 mg by mouth daily., Disp: , Rfl:    fluticasone (FLONASE) 50 MCG/ACT nasal spray, Place 1 spray into both nostrils daily. , Disp: , Rfl:    losartan (COZAAR) 50 MG tablet, Take 50 mg by mouth 2 (two) times daily., Disp: , Rfl:    Magnesium 250 MG TABS, Take 250 mg by mouth at bedtime., Disp: , Rfl:    Multiple Vitamins-Minerals (PRESERVISION AREDS 2) CAPS, Take 1 capsule by mouth 2 (two) times daily., Disp: ,  Rfl:    pregabalin (LYRICA) 50 MG capsule, TAKE 1 CAPSULE(50 MG) BY MOUTH TWICE DAILY IN THE MORNING AND AT BEDTIME, Disp: 60 capsule, Rfl: 1   sertraline (ZOLOFT) 50 MG tablet, Take 50 mg by mouth daily., Disp: , Rfl:    temazepam (RESTORIL) 15 MG capsule, Take 15 mg by mouth at bedtime., Disp: , Rfl: :  :   Allergies  Allergen Reactions   Codeine Other (See Comments)    Abd pain   Erythromycin Other (See Comments)    Abd pain  :   Family History  Problem Relation Age of Onset   Heart attack Father    Diabetes Neg Hx    Hyperlipidemia Neg Hx    Hypertension Neg Hx   :   Social History   Socioeconomic History   Marital status: Married    Spouse name: Not on file   Number of children: Not on file   Years of education: Not on file   Highest education level: Not on file  Occupational History    Not on file  Tobacco Use   Smoking status: Never   Smokeless tobacco: Never  Vaping Use   Vaping Use: Never used  Substance and Sexual Activity   Alcohol use: No   Drug use: No   Sexual activity: Not on file  Other Topics Concern   Not on file  Social History Narrative   Not on file   Social Determinants of Health   Financial Resource Strain: Not on file  Food Insecurity: Not on file  Transportation Needs: Not on file  Physical Activity: Not on file  Stress: Not on file  Social Connections: Not on file  Intimate Partner Violence: Not on file  :  Review of Systems  Constitutional: Negative.   HENT: Negative.    Eyes: Negative.   Respiratory: Negative.    Cardiovascular: Negative.   Gastrointestinal: Negative.   Genitourinary: Negative.   Musculoskeletal: Negative.   Skin: Negative.   Neurological: Negative.   Endo/Heme/Allergies: Negative.   Psychiatric/Behavioral: Negative.       Exam: Her vital signs show temperature of 99.1.  Pulse 83.  Blood pressure 142/80.  Weight is 121 pounds.  @IPVITALS @ Physical Exam Vitals reviewed.  HENT:     Head: Normocephalic and atraumatic.  Eyes:     Pupils: Pupils are equal, round, and reactive to light.  Cardiovascular:     Rate and Rhythm: Normal rate and regular rhythm.     Heart sounds: Normal heart sounds.  Pulmonary:     Effort: Pulmonary effort is normal.     Breath sounds: Normal breath sounds.  Abdominal:     General: Bowel sounds are normal.     Palpations: Abdomen is soft.  Musculoskeletal:        General: No tenderness or deformity. Normal range of motion.     Cervical back: Normal range of motion.  Lymphadenopathy:     Cervical: No cervical adenopathy.  Skin:    General: Skin is warm and dry.     Findings: No erythema or rash.  Neurological:     Mental Status: She is alert and oriented to person, place, and time.  Psychiatric:        Behavior: Behavior normal.        Thought Content: Thought content  normal.        Judgment: Judgment normal.     Recent Labs    10/14/21 1402  WBC 6.1  HGB 12.1  HCT 37.2  PLT 229    Recent Labs    10/14/21 1402  NA 136  K 4.5  CL 101  CO2 26  GLUCOSE 113*  BUN 27*  CREATININE 1.42*  CALCIUM 10.5*    Blood smear review: Normochromic and normocytic population of red blood cells.  There are no nucleated red blood cells.  I see no teardrop cells.  There is no rouleaux formation.  There is no schistocytes or spherocytes.  White blood cells appear normal in morphology and maturation.  She has a couple hypersegmented polys.  I see no immature myeloid or lymphoid cells.  Platelets are adequate number and size.  Platelets are well granulated.  Pathology: None    Assessment and Plan: Julia Mora is a very nice 71 year old white female.  She comes in with her husband.  She had transient leukopenia.  I really cannot explain why her white cell count was low.  The white blood cell count is certainly normal now.  I looked at her blood smear and it clearly looks normal to me.  Again, I really do not have a good explanation as to why she had the transient decrease in her white blood cell count.  I can certainly explain her anemia.  Currently, I really do not see any evidence of anemia.  She has mild renal insufficiency.  I suspect that her erythropoietin level will be on the low side.  For right now, I do not see that we have to do any additional testing.  I do not see that she needs to have a bone marrow test done.  I do not see that we have to do any scans on her.  I am willing just to follow her along.  I would like to see her back in about 4 months.  I think this would be reasonable.  She and her husband are both very very nice.  We had a good fellowship together.

## 2021-10-15 LAB — IRON AND IRON BINDING CAPACITY (CC-WL,HP ONLY)
Iron: 12 ug/dL — ABNORMAL LOW (ref 28–170)
Saturation Ratios: 2 % — ABNORMAL LOW (ref 10.4–31.8)
TIBC: 490 ug/dL — ABNORMAL HIGH (ref 250–450)
UIBC: 478 ug/dL — ABNORMAL HIGH (ref 148–442)

## 2021-10-15 LAB — ERYTHROPOIETIN: Erythropoietin: 17.2 m[IU]/mL (ref 2.6–18.5)

## 2021-10-18 ENCOUNTER — Other Ambulatory Visit: Payer: Self-pay | Admitting: Hematology & Oncology

## 2021-10-18 ENCOUNTER — Encounter: Payer: Self-pay | Admitting: *Deleted

## 2021-10-18 ENCOUNTER — Other Ambulatory Visit: Payer: Self-pay | Admitting: *Deleted

## 2021-10-18 DIAGNOSIS — D509 Iron deficiency anemia, unspecified: Secondary | ICD-10-CM

## 2021-10-18 LAB — HEMOCHROMATOSIS DNA-PCR(C282Y,H63D)

## 2021-10-18 MED ORDER — FUSION PLUS PO CAPS: 1.0000 | ORAL_CAPSULE | Freq: Every day | ORAL | 12 refills | Status: DC

## 2021-11-02 ENCOUNTER — Inpatient Hospital Stay: Payer: Medicare Other

## 2021-11-02 DIAGNOSIS — N183 Chronic kidney disease, stage 3 unspecified: Secondary | ICD-10-CM

## 2021-11-02 DIAGNOSIS — D72819 Decreased white blood cell count, unspecified: Secondary | ICD-10-CM | POA: Diagnosis not present

## 2021-11-02 LAB — CBC WITH DIFFERENTIAL (CANCER CENTER ONLY)
Abs Immature Granulocytes: 0.03 10*3/uL (ref 0.00–0.07)
Basophils Absolute: 0 10*3/uL (ref 0.0–0.1)
Basophils Relative: 1 %
Eosinophils Absolute: 0.1 10*3/uL (ref 0.0–0.5)
Eosinophils Relative: 2 %
HCT: 36.5 % (ref 36.0–46.0)
Hemoglobin: 11.6 g/dL — ABNORMAL LOW (ref 12.0–15.0)
Immature Granulocytes: 1 %
Lymphocytes Relative: 18 %
Lymphs Abs: 0.9 10*3/uL (ref 0.7–4.0)
MCH: 27.9 pg (ref 26.0–34.0)
MCHC: 31.8 g/dL (ref 30.0–36.0)
MCV: 87.7 fL (ref 80.0–100.0)
Monocytes Absolute: 0.4 10*3/uL (ref 0.1–1.0)
Monocytes Relative: 8 %
Neutro Abs: 3.6 10*3/uL (ref 1.7–7.7)
Neutrophils Relative %: 70 %
Platelet Count: 293 10*3/uL (ref 150–400)
RBC: 4.16 MIL/uL (ref 3.87–5.11)
RDW: 14.8 % (ref 11.5–15.5)
WBC Count: 5 10*3/uL (ref 4.0–10.5)
nRBC: 0 % (ref 0.0–0.2)

## 2021-11-02 LAB — CMP (CANCER CENTER ONLY)
ALT: 9 U/L (ref 0–44)
AST: 21 U/L (ref 15–41)
Albumin: 4.4 g/dL (ref 3.5–5.0)
Alkaline Phosphatase: 37 U/L — ABNORMAL LOW (ref 38–126)
Anion gap: 8 (ref 5–15)
BUN: 33 mg/dL — ABNORMAL HIGH (ref 8–23)
CO2: 29 mmol/L (ref 22–32)
Calcium: 10.2 mg/dL (ref 8.9–10.3)
Chloride: 99 mmol/L (ref 98–111)
Creatinine: 1.49 mg/dL — ABNORMAL HIGH (ref 0.44–1.00)
GFR, Estimated: 38 mL/min — ABNORMAL LOW (ref >60)
Glucose, Bld: 114 mg/dL — ABNORMAL HIGH (ref 70–99)
Potassium: 5 mmol/L (ref 3.5–5.1)
Sodium: 136 mmol/L (ref 135–145)
Total Bilirubin: 0.4 mg/dL (ref 0.3–1.2)
Total Protein: 7 g/dL (ref 6.5–8.1)

## 2021-11-02 LAB — SAVE SMEAR(SSMR), FOR PROVIDER SLIDE REVIEW

## 2021-11-08 ENCOUNTER — Telehealth: Payer: Self-pay | Admitting: *Deleted

## 2021-11-08 NOTE — Telephone Encounter (Signed)
Message received from patient requesting a call back regarding how to get stool cards which Dr. Marin Olp had requested for her.  Call placed back to patient and patient notified that she can come in at any time during office hours to get a stool card and when samples have been collected she can bring them back during office hours.  Pt is appreciative of call back and has no further questions at this time.

## 2021-11-29 ENCOUNTER — Other Ambulatory Visit: Payer: Self-pay | Admitting: Physical Medicine and Rehabilitation

## 2021-12-13 ENCOUNTER — Other Ambulatory Visit: Payer: Self-pay

## 2021-12-13 ENCOUNTER — Inpatient Hospital Stay: Payer: Medicare Other | Attending: Hematology & Oncology

## 2021-12-13 DIAGNOSIS — D72819 Decreased white blood cell count, unspecified: Secondary | ICD-10-CM | POA: Insufficient documentation

## 2021-12-13 DIAGNOSIS — D649 Anemia, unspecified: Secondary | ICD-10-CM | POA: Diagnosis present

## 2021-12-13 DIAGNOSIS — D509 Iron deficiency anemia, unspecified: Secondary | ICD-10-CM

## 2021-12-13 LAB — OCCULT BLOOD X 1 CARD TO LAB, STOOL
Fecal Occult Bld: NEGATIVE
Fecal Occult Bld: NEGATIVE
Fecal Occult Bld: NEGATIVE

## 2021-12-14 ENCOUNTER — Telehealth: Payer: Self-pay | Admitting: *Deleted

## 2021-12-14 NOTE — Telephone Encounter (Signed)
Notified pt of results. [T verbalized understanding. No concerns at this time.

## 2021-12-14 NOTE — Telephone Encounter (Signed)
-----   Message from Josph Macho, MD sent at 12/14/2021  6:04 AM EST ----- Call - the stool cards are negative for blood!!!  Cindee Lame

## 2022-01-05 ENCOUNTER — Ambulatory Visit: Payer: Medicare Other | Attending: Neurosurgery | Admitting: Physical Therapy

## 2022-01-05 ENCOUNTER — Other Ambulatory Visit: Payer: Self-pay

## 2022-01-05 DIAGNOSIS — R252 Cramp and spasm: Secondary | ICD-10-CM | POA: Insufficient documentation

## 2022-01-05 DIAGNOSIS — M5416 Radiculopathy, lumbar region: Secondary | ICD-10-CM | POA: Diagnosis present

## 2022-01-05 DIAGNOSIS — M25552 Pain in left hip: Secondary | ICD-10-CM | POA: Diagnosis not present

## 2022-01-05 DIAGNOSIS — M6281 Muscle weakness (generalized): Secondary | ICD-10-CM | POA: Diagnosis present

## 2022-01-05 DIAGNOSIS — R2681 Unsteadiness on feet: Secondary | ICD-10-CM

## 2022-01-05 DIAGNOSIS — M5432 Sciatica, left side: Secondary | ICD-10-CM | POA: Diagnosis present

## 2022-01-05 NOTE — Therapy (Signed)
OUTPATIENT PHYSICAL THERAPY LOWER EXTREMITY EVALUATION   Patient Name: Julia Mora MRN: [id number] DOB:16-Jan-1950, 72 y.o., female Today's Date: 01/05/2022  END OF SESSION:  PT End of Session - 01/05/22 1322     Visit Number 1    Number of Visits 16    Date for PT Re-Evaluation 03/02/22    Authorization Type BCBS Medicare    Progress Note Due on Visit 10    PT Start Time 1320    PT Stop Time 1400    PT Time Calculation (min) 40 min    Activity Tolerance Patient tolerated treatment well    Behavior During Therapy WFL for tasks assessed/performed             Past Medical History:  Diagnosis Date   CKD (chronic kidney disease) stage 3, GFR 30-59 ml/min (HCC)    Hypertension    Osteoporosis    Past Surgical History:  Procedure Laterality Date   ABDOMINAL HYSTERECTOMY     ANUS SURGERY     BLADDER SURGERY     FEMUR IM NAIL Left 06/02/2019   Procedure: INTRAMEDULLARY (IM) NAIL FEMORAL;  Surgeon: Nadara Mustard, MD;  Location: [facility];  Service: Orthopedics;  Laterality: Left;   Patient Active Problem List   Diagnosis Date Noted   Fall    Closed left femoral fracture (HCC) 06/01/2019   Essential hypertension 06/01/2019   CKD (chronic kidney disease) stage 3, GFR 30-59 ml/min (HCC) 06/01/2019   Left knee injury 08/21/2012   Right foot pain 07/24/2012    PCP: Cheron Schaumann., MD  REFERRING PROVIDER: Lorna Dibble, *  REFERRING DIAG: G57.02 (ICD-10-CM) - Piriformis syndrome of left side  THERAPY DIAG:  Pain in left hip  Muscle weakness (generalized)  Unsteadiness on feet  Sciatica, left side  Rationale for Evaluation and Treatment: Rehabilitation  ONSET DATE: ~3 years  SUBJECTIVE:   SUBJECTIVE STATEMENT: Pt states she had therapy a month ago and it was helpful. Pt saw neurosurgeon at [facility] and was diagnosed with piriformis syndrome. Had tried epidurals in the past but they did not help -- so was put on medicatons by prior providers. Wants  to try more rehab to see if it can help more. Pt states she is having difficulty sleeping at night due to pain. Pt reports she feels it mostly in the back of her L thigh. Most of the time it centers around the posterior knee. Can feel like pins/needles at times. Notes L LE is shorter than R LE. Feels imbalanced. Pt states she can ignore it during the day.   PERTINENT HISTORY: Had PT in the past for her back, history of scoliosis  PAIN:  Are you having pain? Yes: NPRS scale: 3 currently at worst 11 or 12 at night/10 Pain location: Posterior L thigh Pain description: Sharp pain occasionally, pins/needles occasionally, burning Aggravating factors: Sleeping/laying down at night Relieving factors: Heating pad, medications  PRECAUTIONS: None  WEIGHT BEARING RESTRICTIONS: No  FALLS:  Has patient fallen in last 6 months? No  LIVING ENVIRONMENT: Lives with: lives with their spouse Lives in: House/apartment Stairs: No issues with stairs Has following equipment at home: None  OCCUPATION: Retired Engineer, site  PLOF: Independent  PATIENT GOALS: Improve pain/sleep; decrease having to use pain medicine  NEXT MD VISIT:   OBJECTIVE:   DIAGNOSTIC FINDINGS: Hip x-ray from 06/07/19: 1. Prior ORIF left femur. Hardware intact. Anatomic alignment. Upper left femoral shaft fracture again noted. Some degree of early callus formation noted.  2. Sclerotic densities in the left sacrum and left pubis. Although these may represent bone islands, blastic metastatic disease cannot be excluded. Whole-body bone scan can be obtained to further Evaluate  MRI lumbar spine 11/14/20: IMPRESSION: 1. Lumbar spine spondylosis as described above. 2. No acute osseous injury of the lumbar spine.   PATIENT SURVEYS:  LEFS 72/80 = 90%  COGNITION: Overall cognitive status: Within functional limits for tasks assessed     SENSATION: L lateral knee decreased sensation vs R  EDEMA:  None  POSTURE: R iliac  crest higher than L in standing; R lateral hip shift  MUSCLE LENGTH: Hamstrings: Right >90 deg; Left 80 deg Thomas test: Right 10 deg below plinth; Left 10 deg below plinth  PALPATION: Pt reports L>R TTP in piriformis, glutes, lumbar paraspinals, hamstrings, gastroc   LOWER EXTREMITY MMT:  MMT Right eval Left eval  Hip flexion 4+ 4+  Hip extension 3 without trunk compensation 3- without trunk compensation  Hip abduction 3+ 3+  Hip adduction    Hip internal rotation    Hip external rotation    Knee flexion 5 4-  Knee extension 5 4+  Ankle dorsiflexion    Ankle plantarflexion    Ankle inversion    Ankle eversion     (Blank rows = not tested)  LOWER EXTREMITY SPECIAL TESTS:  Hip special tests: Saralyn Pilar (FABER) test: positive , Thomas test: negative, and Ely's test: negative can feel in front of thigh bilat Slump test: (+) L, SLR: Neg  FUNCTIONAL TESTS:  5 times sit to stand: to be assessed  GAIT: Distance walked: 100' Assistive device utilized: None Level of assistance: Complete Independence Comments: WFL   TODAY'S TREATMENT:                                                                                                                              DATE: 01/05/22 THERAPEUTIC EXERCISES  Supine  Figure 4 stretch 2x30 sec  Piriformis stretch 2x30 sec   Hamstring stretch with strap 2x30 sec  Sciatic nerve glide x10   PATIENT EDUCATION:  Education details: Exam findings, POC, initial HEP Person educated: Patient Education method: Explanation, Demonstration, and Handouts Education comprehension: verbalized understanding, returned demonstration, and needs further education  HOME EXERCISE PROGRAM: Access Code: [email or web address] URL: [email or web address] Date: 01/05/2022 Prepared by: Estill Bamberg April Thurnell Garbe  Exercises - Supine Figure 4 Piriformis Stretch  - 3 x daily - 7 x weekly - 2 sets - 30 sec hold - Supine Piriformis Stretch with Foot on Ground  - 3 x  daily - 7 x weekly - 2 sets - 30 sec hold - Supine Hamstring Stretch with Strap  - 1 x daily - 7 x weekly - 2 sets - 30 sec hold - Supine Sciatic Nerve Glide  - 1 x daily - 7 x weekly - 1 sets - 10 reps  ASSESSMENT:  CLINICAL IMPRESSION: Patient is a 72 y.o. F who was seen today  for physical therapy evaluation and treatment for L piriformis syndrome. PMH significant for scoliosis and multiple lumbar epidurals. Assessment significant for bilat hip weakness and increased muscle and nerve tension in L LE vs R LE leading to increased pain and discomfort mostly while sleeping. Initiated stretches for piriformis/hips and sciatic nerve glides. Pt would benefit from PT to address these issues for improved function at home.   OBJECTIVE IMPAIRMENTS: decreased activity tolerance, decreased balance, difficulty walking, decreased ROM, decreased strength, increased fascial restrictions, increased muscle spasms, postural dysfunction, and pain.   ACTIVITY LIMITATIONS: sleeping and locomotion level  PARTICIPATION LIMITATIONS: meal prep and community activity  PERSONAL FACTORS: Past/current experiences and Time since onset of injury/illness/exacerbation are also affecting patient's functional outcome.   REHAB POTENTIAL: Good  CLINICAL DECISION MAKING: Evolving/moderate complexity  EVALUATION COMPLEXITY: Moderate   GOALS: Goals reviewed with patient? Yes  SHORT TERM GOALS: Target date: 02/02/2022  Pt will be ind with initial HEP Baseline: Goal status: INITIAL  2.  PT will assess 5 x STS within the next 2 visits Baseline:  Goal status: INITIAL  3.  PT will assess DGI within the next 2 visits Baseline:  Goal status: INITIAL   LONG TERM GOALS: Target date: 03/02/2022   Pt will be ind with advancing and progressing HEP Baseline:  Goal status: INITIAL  2.  Pt will demo improved bilat hip strength to at least 4+/5 for improved transfers and mobility Baseline:  Goal status: INITIAL  3.  Pt  will report decrease in pain by >/=50% Baseline:  Goal status: INITIAL  4.  Pt will demo L = R SLR to demo improving neural tension and hamstring length Baseline:  Goal status: INITIAL  5.  Pt will have increased LEFS to >/= 95% Baseline:  Goal status: INITIAL    PLAN:  PT FREQUENCY: 2x/week  PT DURATION: 8 weeks  PLANNED INTERVENTIONS: Therapeutic exercises, Therapeutic activity, Neuromuscular re-education, Balance training, Gait training, Patient/Family education, Self Care, Joint mobilization, Stair training, Aquatic Therapy, Dry Needling, Electrical stimulation, Spinal mobilization, Cryotherapy, Moist heat, Taping, Ionotophoresis 4mg /ml Dexamethasone, Manual therapy, and Re-evaluation  PLAN FOR NEXT SESSION: Assess response to initial HEP. Continue to stretch hip/piriformis. Manual work as needed. Initiate hip and core strengthening. Assess 5x STS and DGI.    Dawnyel Leven April Ma L Seven [place], PT 01/05/2022, 4:41 PM

## 2022-01-12 ENCOUNTER — Ambulatory Visit: Payer: Medicare Other

## 2022-01-13 ENCOUNTER — Ambulatory Visit: Payer: Medicare Other

## 2022-01-13 DIAGNOSIS — M25552 Pain in left hip: Secondary | ICD-10-CM

## 2022-01-13 DIAGNOSIS — R2681 Unsteadiness on feet: Secondary | ICD-10-CM

## 2022-01-13 DIAGNOSIS — M5432 Sciatica, left side: Secondary | ICD-10-CM

## 2022-01-13 DIAGNOSIS — M6281 Muscle weakness (generalized): Secondary | ICD-10-CM

## 2022-01-13 NOTE — Therapy (Signed)
OUTPATIENT PHYSICAL THERAPY LOWER EXTREMITY EVALUATION   Patient Name: Julia Mora MRN: [id number] DOB:07-20-1950, 72 y.o., female Today's Date: 01/13/2022  END OF SESSION:  PT End of Session - 01/13/22 1158     Visit Number 2    Number of Visits 16    Date for PT Re-Evaluation 03/02/22    Authorization Type BCBS Medicare    Progress Note Due on Visit 10    PT Start Time 1107   pt late   PT Stop Time 1144    PT Time Calculation (min) 37 min    Activity Tolerance Patient tolerated treatment well    Behavior During Therapy WFL for tasks assessed/performed             Past Medical History:  Diagnosis Date   CKD (chronic kidney disease) stage 3, GFR 30-59 ml/min (HCC)    Hypertension    Osteoporosis    Past Surgical History:  Procedure Laterality Date   ABDOMINAL HYSTERECTOMY     ANUS SURGERY     BLADDER SURGERY     FEMUR IM NAIL Left 06/02/2019   Procedure: INTRAMEDULLARY (IM) NAIL FEMORAL;  Surgeon: Newt Minion, MD;  Location: [facility];  Service: Orthopedics;  Laterality: Left;   Patient Active Problem List   Diagnosis Date Noted   Fall    Closed left femoral fracture ([place]) 06/01/2019   Essential hypertension 06/01/2019   CKD (chronic kidney disease) stage 3, GFR 30-59 ml/min ([place]) 06/01/2019   Left knee injury 08/21/2012   Right foot pain 07/24/2012    PCP: Loraine Leriche., MD  REFERRING PROVIDER: Myrle Sheng, *  REFERRING DIAG: G57.02 (ICD-10-CM) - Piriformis syndrome of left side  THERAPY DIAG:  Pain in left hip  Muscle weakness (generalized)  Unsteadiness on feet  Sciatica, left side  Rationale for Evaluation and Treatment: Rehabilitation  ONSET DATE: ~3 years  SUBJECTIVE:   SUBJECTIVE STATEMENT: Pt states she had therapy a month ago and it was helpful. Pt saw neurosurgeon at [facility] and was diagnosed with piriformis syndrome. Had tried epidurals in the past but they did not help -- so was put on medicatons by prior  providers. Wants to try more rehab to see if it can help more. Pt states she is having difficulty sleeping at night due to pain. Pt reports she feels it mostly in the back of her L thigh. Most of the time it centers around the posterior knee. Can feel like pins/needles at times. Notes L LE is shorter than R LE. Feels imbalanced. Pt states she can ignore it during the day.   PERTINENT HISTORY: Had PT in the past for her back, history of scoliosis  PAIN:  Are you having pain? Yes: NPRS scale: 3 currently at worst 11 or 12 at night/10 Pain location: Posterior L thigh Pain description: Sharp pain occasionally, pins/needles occasionally, burning Aggravating factors: Sleeping/laying down at night Relieving factors: Heating pad, medications  PRECAUTIONS: None  WEIGHT BEARING RESTRICTIONS: No  FALLS:  Has patient fallen in last 6 months? No  LIVING ENVIRONMENT: Lives with: lives with their spouse Lives in: House/apartment Stairs: No issues with stairs Has following equipment at home: None  OCCUPATION: Retired Education officer, museum  PLOF: Independent  PATIENT GOALS: Improve pain/sleep; decrease having to use pain medicine  NEXT MD VISIT:   OBJECTIVE:   DIAGNOSTIC FINDINGS: Hip x-ray from 06/07/19: 1. Prior ORIF left femur. Hardware intact. Anatomic alignment. Upper left femoral shaft fracture again noted. Some degree of early  callus formation noted.   2. Sclerotic densities in the left sacrum and left pubis. Although these may represent bone islands, blastic metastatic disease cannot be excluded. Whole-body bone scan can be obtained to further Evaluate  MRI lumbar spine 11/14/20: IMPRESSION: 1. Lumbar spine spondylosis as described above. 2. No acute osseous injury of the lumbar spine.   PATIENT SURVEYS:  LEFS 72/80 = 90%  COGNITION: Overall cognitive status: Within functional limits for tasks assessed     SENSATION: L lateral knee decreased sensation vs R  EDEMA:   None  POSTURE: R iliac crest higher than L in standing; R lateral hip shift  MUSCLE LENGTH: Hamstrings: Right >90 deg; Left 80 deg Thomas test: Right 10 deg below plinth; Left 10 deg below plinth  PALPATION: Pt reports L>R TTP in piriformis, glutes, lumbar paraspinals, hamstrings, gastroc   LOWER EXTREMITY MMT:  MMT Right eval Left eval  Hip flexion 4+ 4+  Hip extension 3 without trunk compensation 3- without trunk compensation  Hip abduction 3+ 3+  Hip adduction    Hip internal rotation    Hip external rotation    Knee flexion 5 4-  Knee extension 5 4+  Ankle dorsiflexion    Ankle plantarflexion    Ankle inversion    Ankle eversion     (Blank rows = not tested)  LOWER EXTREMITY SPECIAL TESTS:  Hip special tests: Luisa Hart (FABER) test: positive , Thomas test: negative, and Ely's test: negative can feel in front of thigh bilat Slump test: (+) L, SLR: Neg  FUNCTIONAL TESTS:  5 times sit to stand: to be assessed  GAIT: Distance walked: 100' Assistive device utilized: None Level of assistance: Complete Independence Comments: WFL   TODAY'S TREATMENT:                                                                                                                              DATE:  01/13/22 Figure 4 stretch 2x30 sec  Piriformis stretch 2x30 sec   Hamstring stretch with strap 2x30 sec  Sciatic nerve glide x10 Bridges x 10  SLR x 10 bil S/L clamshells x 10 R/L RTB Supine marches RTB x 10 bil  01/05/22 THERAPEUTIC EXERCISES  Supine  Figure 4 stretch 2x30 sec  Piriformis stretch 2x30 sec   Hamstring stretch with strap 2x30 sec  Sciatic nerve glide x10   PATIENT EDUCATION:  Education details: Exam findings, POC, initial HEP Person educated: Patient Education method: Explanation, Demonstration, and Handouts Education comprehension: verbalized understanding, returned demonstration, and needs further education  HOME EXERCISE PROGRAM: Access Code: [email or web address] URL:  [email or web address] Date: 01/05/2022 Prepared by: Vernon Prey April Kirstie Peri  Exercises - Supine Figure 4 Piriformis Stretch  - 3 x daily - 7 x weekly - 2 sets - 30 sec hold - Supine Piriformis Stretch with Foot on Ground  - 3 x daily - 7 x weekly - 2 sets - 30 sec hold - Supine Hamstring Stretch with Strap  -  1 x daily - 7 x weekly - 2 sets - 30 sec hold - Supine Sciatic Nerve Glide  - 1 x daily - 7 x weekly - 1 sets - 10 reps  ASSESSMENT:  CLINICAL IMPRESSION: Progressed hip strengthening to tolerance to improve stability of pelvic girdle and LB. Provided instruction and cuing with exercises as needed to correct form. Reviewed initial HEP to ensure understanding and updated hip strengthening to HEP. Will continue to progress exercises to improve tolerance for ADLs.   OBJECTIVE IMPAIRMENTS: decreased activity tolerance, decreased balance, difficulty walking, decreased ROM, decreased strength, increased fascial restrictions, increased muscle spasms, postural dysfunction, and pain.   ACTIVITY LIMITATIONS: sleeping and locomotion level  PARTICIPATION LIMITATIONS: meal prep and community activity  PERSONAL FACTORS: Past/current experiences and Time since onset of injury/illness/exacerbation are also affecting patient's functional outcome.   REHAB POTENTIAL: Good  CLINICAL DECISION MAKING: Evolving/moderate complexity  EVALUATION COMPLEXITY: Moderate   GOALS: Goals reviewed with patient? Yes  SHORT TERM GOALS: Target date: 02/02/2022  Pt will be ind with initial HEP Baseline: Goal status: INITIAL  2.  PT will assess 5 x STS within the next 2 visits Baseline:  Goal status: INITIAL  3.  PT will assess DGI within the next 2 visits Baseline:  Goal status: INITIAL   LONG TERM GOALS: Target date: 03/02/2022   Pt will be ind with advancing and progressing HEP Baseline:  Goal status: INITIAL  2.  Pt will demo improved bilat hip strength to at least 4+/5 for  improved transfers and mobility Baseline:  Goal status: INITIAL  3.  Pt will report decrease in pain by >/=50% Baseline:  Goal status: INITIAL  4.  Pt will demo L = R SLR to demo improving neural tension and hamstring length Baseline:  Goal status: INITIAL  5.  Pt will have increased LEFS to >/= 95% Baseline:  Goal status: INITIAL    PLAN:  PT FREQUENCY: 2x/week  PT DURATION: 8 weeks  PLANNED INTERVENTIONS: Therapeutic exercises, Therapeutic activity, Neuromuscular re-education, Balance training, Gait training, Patient/Family education, Self Care, Joint mobilization, Stair training, Aquatic Therapy, Dry Needling, Electrical stimulation, Spinal mobilization, Cryotherapy, Moist heat, Taping, Ionotophoresis 4mg /ml Dexamethasone, Manual therapy, and Re-evaluation  PLAN FOR NEXT SESSION: Assess response to initial HEP. Continue to stretch hip/piriformis. Manual work as needed. Initiate hip and core strengthening. Assess 5x STS and DGI.    Artist Pais, PTA 01/13/2022, 12:04 PM

## 2022-01-17 ENCOUNTER — Encounter: Payer: Self-pay | Admitting: Physical Therapy

## 2022-01-17 ENCOUNTER — Ambulatory Visit: Payer: Medicare Other | Admitting: Physical Therapy

## 2022-01-17 DIAGNOSIS — M25552 Pain in left hip: Secondary | ICD-10-CM

## 2022-01-17 DIAGNOSIS — M5432 Sciatica, left side: Secondary | ICD-10-CM

## 2022-01-17 DIAGNOSIS — R2681 Unsteadiness on feet: Secondary | ICD-10-CM

## 2022-01-17 DIAGNOSIS — M5416 Radiculopathy, lumbar region: Secondary | ICD-10-CM

## 2022-01-17 DIAGNOSIS — R252 Cramp and spasm: Secondary | ICD-10-CM

## 2022-01-17 DIAGNOSIS — M6281 Muscle weakness (generalized): Secondary | ICD-10-CM

## 2022-01-17 NOTE — Therapy (Signed)
OUTPATIENT PHYSICAL THERAPY TREATMENT  Patient Name: Julia Mora MRN: [id number] DOB:04/27/1950, 72 y.o., female Today's Date: 01/17/2022  END OF SESSION:  PT End of Session - 01/17/22 1407     Visit Number 3    Number of Visits 16    Date for PT Re-Evaluation 03/02/22    Authorization Type BCBS Medicare    Progress Note Due on Visit 10    PT Start Time 1404    PT Stop Time 1451    PT Time Calculation (min) 47 min    Activity Tolerance Patient tolerated treatment well    Behavior During Therapy WFL for tasks assessed/performed             Past Medical History:  Diagnosis Date   CKD (chronic kidney disease) stage 3, GFR 30-59 ml/min (HCC)    Hypertension    Osteoporosis    Past Surgical History:  Procedure Laterality Date   ABDOMINAL HYSTERECTOMY     ANUS SURGERY     BLADDER SURGERY     FEMUR IM NAIL Left 06/02/2019   Procedure: INTRAMEDULLARY (IM) NAIL FEMORAL;  Surgeon: Nadara Mustard, MD;  Location: [facility] OR;  Service: Orthopedics;  Laterality: Left;   Patient Active Problem List   Diagnosis Date Noted   Fall    Closed left femoral fracture (HCC) 06/01/2019   Essential hypertension 06/01/2019   CKD (chronic kidney disease) stage 3, GFR 30-59 ml/min (HCC) 06/01/2019   Left knee injury 08/21/2012   Right foot pain 07/24/2012    PCP: Cheron Schaumann., MD  REFERRING PROVIDER: Lorna Dibble, *  REFERRING DIAG: G57.02 (ICD-10-CM) - Piriformis syndrome of left side  THERAPY DIAG:  Pain in left hip  Muscle weakness (generalized)  Unsteadiness on feet  Sciatica, left side  Radiculopathy, lumbar region  Cramp and spasm  Rationale for Evaluation and Treatment: Rehabilitation  ONSET DATE: ~3 years  SUBJECTIVE:   SUBJECTIVE STATEMENT: "Not good today."  Reported increased pain started last night.  Tried to do stretches.   PERTINENT HISTORY: Had PT in the past for her back, history of scoliosis  PAIN:  Are you having pain? Yes: NPRS  scale: 5/10 Pain location: Posterior L thigh Pain description: Sharp pain occasionally, pins/needles occasionally, burning Aggravating factors: Sleeping/laying down at night Relieving factors: Heating pad, medications  PRECAUTIONS: None  WEIGHT BEARING RESTRICTIONS: No  FALLS:  Has patient fallen in last 6 months? No  LIVING ENVIRONMENT: Lives with: lives with their spouse Lives in: House/apartment Stairs: No issues with stairs Has following equipment at home: None  OCCUPATION: Retired Engineer, site  PLOF: Independent  PATIENT GOALS: Improve pain/sleep; decrease having to use pain medicine  NEXT MD VISIT:   OBJECTIVE:   DIAGNOSTIC FINDINGS: Hip x-ray from 06/07/19: 1. Prior ORIF left femur. Hardware intact. Anatomic alignment. Upper left femoral shaft fracture again noted. Some degree of early callus formation noted.   2. Sclerotic densities in the left sacrum and left pubis. Although these may represent bone islands, blastic metastatic disease cannot be excluded. Whole-body bone scan can be obtained to further Evaluate  MRI lumbar spine 11/14/20: IMPRESSION: 1. Lumbar spine spondylosis as described above. 2. No acute osseous injury of the lumbar spine.   PATIENT SURVEYS:  LEFS 72/80 = 90%  COGNITION: Overall cognitive status: Within functional limits for tasks assessed     SENSATION: L lateral knee decreased sensation vs R  EDEMA:  None  POSTURE: R iliac crest higher than L in standing; R lateral  hip shift  MUSCLE LENGTH: Hamstrings: Right >90 deg; Left 80 deg Thomas test: Right 10 deg below plinth; Left 10 deg below plinth  PALPATION: Pt reports L>R TTP in piriformis, glutes, lumbar paraspinals, hamstrings, gastroc   LOWER EXTREMITY MMT:  MMT Right eval Left eval  Hip flexion 4+ 4+  Hip extension 3 without trunk compensation 3- without trunk compensation  Hip abduction 3+ 3+  Hip adduction    Hip internal rotation    Hip external rotation     Knee flexion 5 4-  Knee extension 5 4+  Ankle dorsiflexion    Ankle plantarflexion    Ankle inversion    Ankle eversion     (Blank rows = not tested)  LOWER EXTREMITY SPECIAL TESTS:  Hip special tests: Saralyn Pilar (FABER) test: positive , Thomas test: negative, and Ely's test: negative can feel in front of thigh bilat Slump test: (+) L, SLR: Neg  FUNCTIONAL TESTS:  5 times sit to stand: to be assessed  GAIT: Distance walked: 100' Assistive device utilized: None Level of assistance: Complete Independence Comments: WFL   TODAY'S TREATMENT:                                                                                                                              DATE:   01/17/22 Therapeutic Exercise: to improve strength and mobility.  Demo, verbal and tactile cues throughout for technique. Bike L1 x 6 min Seated piriformis stretches - preferred sitting with leg crossed and bending forward, felt best stretch.  LTR x 10  Windshield wipers x10 to gradually increasing ROM and stretch PPT x 10  PPT with ball squeeze 10 x 5 sec hold Bridges  with ball squeeze x 10 - cues for breathing.   Manual Therapy: to decrease muscle spasm and pain and improve mobility STM/TPR to L piriformis and glutes, skilled palpation and monitoring during dry needling. Trigger Point Dry-Needling  Treatment instructions: Expect mild to moderate muscle soreness. S/S of pneumothorax if dry needled over a lung field, and to seek immediate medical attention should they occur. Patient verbalized understanding of these instructions and education. Patient Consent Given: Yes Education handout provided: Yes Muscles treated: L glut med, L piriformis Electrical stimulation performed: No Parameters: N/A Treatment response/outcome: Twitch Response Elicited and Palpable Increase in Muscle Length  01/13/22 Figure 4 stretch 2x30 sec  Piriformis stretch 2x30 sec   Hamstring stretch with strap 2x30 sec  Sciatic nerve  glide x10 Bridges x 10  SLR x 10 bil S/L clamshells x 10 R/L RTB Supine marches RTB x 10 bil  01/05/22 THERAPEUTIC EXERCISES  Supine  Figure 4 stretch 2x30 sec  Piriformis stretch 2x30 sec   Hamstring stretch with strap 2x30 sec  Sciatic nerve glide x10   PATIENT EDUCATION:  Education details: Exam findings, POC, initial HEP Person educated: Patient Education method: Explanation, Demonstration, and Handouts Education comprehension: verbalized understanding, returned demonstration, and needs further education  HOME EXERCISE  PROGRAM: Access Code: [email or web address] URL: [email or web address] Date: 01/05/2022 Prepared by: Estill Bamberg April Thurnell Garbe  Exercises - Supine Figure 4 Piriformis Stretch  - 3 x daily - 7 x weekly - 2 sets - 30 sec hold - Supine Piriformis Stretch with Foot on Ground  - 3 x daily - 7 x weekly - 2 sets - 30 sec hold - Supine Hamstring Stretch with Strap  - 1 x daily - 7 x weekly - 2 sets - 30 sec hold - Supine Sciatic Nerve Glide  - 1 x daily - 7 x weekly - 1 sets - 10 reps  ASSESSMENT:  CLINICAL IMPRESSION: DEBBI STRANDBERG reported increased L hip pain today, so focused on reviewing exercises for hip mobility, piriformis stretches, and core strengthening followed by manual therapy.  After explanation of DN rational, procedures, outcomes and potential side effects, patient verbalized consent to DN treatment in conjunction with manual STM/DTM and TPR to reduce ttp/muscle tension. Muscles treated as indicated above. DN produced normal response with good twitches elicited resulting in palpable reduction in pain/ttp and muscle tension, with patient noting less pain upon initiation of movement following DN. Pt educated to expect mild to moderate muscle soreness for up to 24-48 hrs and instructed to continue prescribed home exercise program and current activity level with pt verbalizing understanding of theses instructions.   Cristy Friedlander Routzahn reported decreased  pain at end of session.  Melany Wiesman Zappia continues to demonstrate potential for improvement and would benefit from continued skilled therapy to address impairments.     OBJECTIVE IMPAIRMENTS: decreased activity tolerance, decreased balance, difficulty walking, decreased ROM, decreased strength, increased fascial restrictions, increased muscle spasms, postural dysfunction, and pain.   ACTIVITY LIMITATIONS: sleeping and locomotion level  PARTICIPATION LIMITATIONS: meal prep and community activity  PERSONAL FACTORS: Past/current experiences and Time since onset of injury/illness/exacerbation are also affecting patient's functional outcome.   REHAB POTENTIAL: Good  CLINICAL DECISION MAKING: Evolving/moderate complexity  EVALUATION COMPLEXITY: Moderate   GOALS: Goals reviewed with patient? Yes  SHORT TERM GOALS: Target date: 02/02/2022  Pt will be ind with initial HEP Baseline: Goal status: IN PROGRESS  2.  PT will assess 5 x STS within the next 2 visits Baseline:  Goal status: INITIAL  3.  PT will assess DGI within the next 2 visits Baseline:  Goal status: INITIAL   LONG TERM GOALS: Target date: 03/02/2022   Pt will be ind with advancing and progressing HEP Baseline:  Goal status: IN PROGRESS  2.  Pt will demo improved bilat hip strength to at least 4+/5 for improved transfers and mobility Baseline:  Goal status: IN PROGRESS  3.  Pt will report decrease in pain by >/=50% Baseline:  Goal status: IN PROGRESS  4.  Pt will demo L = R SLR to demo improving neural tension and hamstring length Baseline:  Goal status: IN PROGRESS  5.  Pt will have increased LEFS to >/= 95% Baseline:  Goal status: IN PROGRESS  PLAN:  PT FREQUENCY: 2x/week  PT DURATION: 8 weeks  PLANNED INTERVENTIONS: Therapeutic exercises, Therapeutic activity, Neuromuscular re-education, Balance training, Gait training, Patient/Family education, Self Care, Joint mobilization, Stair training,  Aquatic Therapy, Dry Needling, Electrical stimulation, Spinal mobilization, Cryotherapy, Moist heat, Taping, Ionotophoresis 4mg /ml Dexamethasone, Manual therapy, and Re-evaluation  PLAN FOR NEXT SESSION: Assess 5x STS and DGI for STG #2 & #3.  Continue to hip/core/glute strengthening and hip mobility. Manual work as needed.    Rennie Natter, PT, DPT  01/17/2022, 3:02  PM

## 2022-01-19 ENCOUNTER — Other Ambulatory Visit: Payer: Self-pay

## 2022-01-19 ENCOUNTER — Ambulatory Visit: Payer: Medicare Other

## 2022-01-19 DIAGNOSIS — M5416 Radiculopathy, lumbar region: Secondary | ICD-10-CM

## 2022-01-19 DIAGNOSIS — M5432 Sciatica, left side: Secondary | ICD-10-CM

## 2022-01-19 DIAGNOSIS — M25552 Pain in left hip: Secondary | ICD-10-CM

## 2022-01-19 DIAGNOSIS — R2681 Unsteadiness on feet: Secondary | ICD-10-CM

## 2022-01-19 DIAGNOSIS — R252 Cramp and spasm: Secondary | ICD-10-CM

## 2022-01-19 DIAGNOSIS — M6281 Muscle weakness (generalized): Secondary | ICD-10-CM

## 2022-01-19 DIAGNOSIS — D509 Iron deficiency anemia, unspecified: Secondary | ICD-10-CM

## 2022-01-19 NOTE — Therapy (Signed)
OUTPATIENT PHYSICAL THERAPY TREATMENT  Patient Name: Julia Mora MRN: [id number] DOB:Apr 19, 1950, 72 y.o., female Today's Date: 01/19/2022  END OF SESSION:  PT End of Session - 01/19/22 1409     Visit Number 4    Number of Visits 16    Date for PT Re-Evaluation 03/02/22    Authorization Type BCBS Medicare    Progress Note Due on Visit 10    PT Start Time 1316    PT Stop Time 1359    PT Time Calculation (min) 43 min    Activity Tolerance Patient tolerated treatment well    Behavior During Therapy WFL for tasks assessed/performed              Past Medical History:  Diagnosis Date   CKD (chronic kidney disease) stage 3, GFR 30-59 ml/min (HCC)    Hypertension    Osteoporosis    Past Surgical History:  Procedure Laterality Date   ABDOMINAL HYSTERECTOMY     ANUS SURGERY     BLADDER SURGERY     FEMUR IM NAIL Left 06/02/2019   Procedure: INTRAMEDULLARY (IM) NAIL FEMORAL;  Surgeon: Nadara Mustard, MD;  Location: [facility] OR;  Service: Orthopedics;  Laterality: Left;   Patient Active Problem List   Diagnosis Date Noted   Fall    Closed left femoral fracture (HCC) 06/01/2019   Essential hypertension 06/01/2019   CKD (chronic kidney disease) stage 3, GFR 30-59 ml/min (HCC) 06/01/2019   Left knee injury 08/21/2012   Right foot pain 07/24/2012    PCP: Cheron Schaumann., MD  REFERRING PROVIDER: Lorna Dibble, *  REFERRING DIAG: G57.02 (ICD-10-CM) - Piriformis syndrome of left side  THERAPY DIAG:  Pain in left hip  Muscle weakness (generalized)  Unsteadiness on feet  Sciatica, left side  Radiculopathy, lumbar region  Cramp and spasm  Rationale for Evaluation and Treatment: Rehabilitation  ONSET DATE: ~3 years  SUBJECTIVE:   SUBJECTIVE STATEMENT: Patient reports the pain worsens at random times. It was worse when she first walked in but now it seems to have eased.  PERTINENT HISTORY: Had PT in the past for her back, history of  scoliosis  PAIN:  Are you having pain? Yes: NPRS scale: 3/10 Pain location: Posterior L thigh Pain description: Sharp pain occasionally, pins/needles occasionally, burning Aggravating factors: Sleeping/laying down at night Relieving factors: Heating pad, medications  PRECAUTIONS: None  WEIGHT BEARING RESTRICTIONS: No  FALLS:  Has patient fallen in last 6 months? No  LIVING ENVIRONMENT: Lives with: lives with their spouse Lives in: House/apartment Stairs: No issues with stairs Has following equipment at home: None  OCCUPATION: Retired Engineer, site  PLOF: Independent  PATIENT GOALS: Improve pain/sleep; decrease having to use pain medicine  NEXT MD VISIT:   OBJECTIVE:   DIAGNOSTIC FINDINGS: Hip x-ray from 06/07/19: 1. Prior ORIF left femur. Hardware intact. Anatomic alignment. Upper left femoral shaft fracture again noted. Some degree of early callus formation noted.   2. Sclerotic densities in the left sacrum and left pubis. Although these may represent bone islands, blastic metastatic disease cannot be excluded. Whole-body bone scan can be obtained to further Evaluate  MRI lumbar spine 11/14/20: IMPRESSION: 1. Lumbar spine spondylosis as described above. 2. No acute osseous injury of the lumbar spine.   PATIENT SURVEYS:  LEFS 72/80 = 90%  COGNITION: Overall cognitive status: Within functional limits for tasks assessed     SENSATION: L lateral knee decreased sensation vs R  EDEMA:  None  POSTURE: R iliac  crest higher than L in standing; R lateral hip shift  MUSCLE LENGTH: Hamstrings: Right >90 deg; Left 80 deg Thomas test: Right 10 deg below plinth; Left 10 deg below plinth  PALPATION: Pt reports L>R TTP in piriformis, glutes, lumbar paraspinals, hamstrings, gastroc   LOWER EXTREMITY MMT:  MMT Right eval Left eval  Hip flexion 4+ 4+  Hip extension 3 without trunk compensation 3- without trunk compensation  Hip abduction 3+ 3+  Hip adduction     Hip internal rotation    Hip external rotation    Knee flexion 5 4-  Knee extension 5 4+  Ankle dorsiflexion    Ankle plantarflexion    Ankle inversion    Ankle eversion     (Blank rows = not tested)  LOWER EXTREMITY SPECIAL TESTS:  Hip special tests: Saralyn Pilar (FABER) test: positive , Thomas test: negative, and Ely's test: negative can feel in front of thigh bilat Slump test: (+) L, SLR: Neg  FUNCTIONAL TESTS:  5 times sit to stand: 11 sec no UE support  GAIT: Distance walked: 100' Assistive device utilized: None Level of assistance: Complete Independence Comments: WFL   TODAY'S TREATMENT:                                                                                                                              DATE:   01/19/22 Therapeutic Exercise: to improve strength and mobility.  Demo, verbal and tactile cues throughout for technique. 5xSTS- 11 sec no UE support DGI: 17/24 Supine PPT with ball squeeze 10x5"  Supine bridge with ball squeeze 10x5" Seated clams RTB 10x5" STS with RTB at knees x 10 Seated ER bil AROM x 10  Seated piriformis stretch with leg on table x 30 sec bil  01/17/22 Therapeutic Exercise: to improve strength and mobility.  Demo, verbal and tactile cues throughout for technique. Bike L1 x 6 min Seated piriformis stretches - preferred sitting with leg crossed and bending forward, felt best stretch.  LTR x 10  Windshield wipers x10 to gradually increasing ROM and stretch PPT x 10  PPT with ball squeeze 10 x 5 sec hold Bridges  with ball squeeze x 10 - cues for breathing.   Manual Therapy: to decrease muscle spasm and pain and improve mobility STM/TPR to L piriformis and glutes, skilled palpation and monitoring during dry needling. Trigger Point Dry-Needling  Treatment instructions: Expect mild to moderate muscle soreness. S/S of pneumothorax if dry needled over a lung field, and to seek immediate medical attention should they occur. Patient  verbalized understanding of these instructions and education. Patient Consent Given: Yes Education handout provided: Yes Muscles treated: L glut med, L piriformis Electrical stimulation performed: No Parameters: N/A Treatment response/outcome: Twitch Response Elicited and Palpable Increase in Muscle Length  01/13/22 Figure 4 stretch 2x30 sec  Piriformis stretch 2x30 sec   Hamstring stretch with strap 2x30 sec  Sciatic nerve glide x10 Bridges x 10  SLR x  10 bil S/L clamshells x 10 R/L RTB Supine marches RTB x 10 bil     PATIENT EDUCATION:  Education details: Exam findings, POC, initial HEP Person educated: Patient Education method: Explanation, Demonstration, and Handouts Education comprehension: verbalized understanding, returned demonstration, and needs further education  HOME EXERCISE PROGRAM: Access Code: [email or web address] URL: [email or web address] Date: 01/05/2022 Prepared by: Estill Bamberg April Thurnell Garbe  Exercises - Supine Figure 4 Piriformis Stretch  - 3 x daily - 7 x weekly - 2 sets - 30 sec hold - Supine Piriformis Stretch with Foot on Ground  - 3 x daily - 7 x weekly - 2 sets - 30 sec hold - Supine Hamstring Stretch with Strap  - 1 x daily - 7 x weekly - 2 sets - 30 sec hold - Supine Sciatic Nerve Glide  - 1 x daily - 7 x weekly - 1 sets - 10 reps  ASSESSMENT:  CLINICAL IMPRESSION: Pt showed a good performance of the balance test today. She scored 11 sec on 5xSTS. DGI score was 17/24 which could indicate risk for falls. We continued working on hip mobility and strengthening. Verbal cues were needed to correct her genu valgus on R knee with STS. She would continue to benefit from skilled PT.    OBJECTIVE IMPAIRMENTS: decreased activity tolerance, decreased balance, difficulty walking, decreased ROM, decreased strength, increased fascial restrictions, increased muscle spasms, postural dysfunction, and pain.   ACTIVITY LIMITATIONS: sleeping and locomotion  level  PARTICIPATION LIMITATIONS: meal prep and community activity  PERSONAL FACTORS: Past/current experiences and Time since onset of injury/illness/exacerbation are also affecting patient's functional outcome.   REHAB POTENTIAL: Good  CLINICAL DECISION MAKING: Evolving/moderate complexity  EVALUATION COMPLEXITY: Moderate   GOALS: Goals reviewed with patient? Yes  SHORT TERM GOALS: Target date: 02/02/2022  Pt will be ind with initial HEP Baseline: Goal status: IN PROGRESS  2.  PT will assess 5 x STS within the next 2 visits Baseline:  Goal status: MET- 01/19/22  3.  PT will assess DGI within the next 2 visits Baseline:  Goal status: MET-01/19/22   LONG TERM GOALS: Target date: 03/02/2022   Pt will be ind with advancing and progressing HEP Baseline:  Goal status: IN PROGRESS  2.  Pt will demo improved bilat hip strength to at least 4+/5 for improved transfers and mobility Baseline:  Goal status: IN PROGRESS  3.  Pt will report decrease in pain by >/=50% Baseline:  Goal status: IN PROGRESS  4.  Pt will demo L = R SLR to demo improving neural tension and hamstring length Baseline:  Goal status: IN PROGRESS  5.  Pt will have increased LEFS to >/= 95% Baseline:  Goal status: IN PROGRESS  PLAN:  PT FREQUENCY: 2x/week  PT DURATION: 8 weeks  PLANNED INTERVENTIONS: Therapeutic exercises, Therapeutic activity, Neuromuscular re-education, Balance training, Gait training, Patient/Family education, Self Care, Joint mobilization, Stair training, Aquatic Therapy, Dry Needling, Electrical stimulation, Spinal mobilization, Cryotherapy, Moist heat, Taping, Ionotophoresis 4mg /ml Dexamethasone, Manual therapy, and Re-evaluation  PLAN FOR NEXT SESSION:  Continue to hip/core/glute strengthening and hip mobility; incorporate dynamic, higher level balance; Manual work as needed.    Artist Pais, PTA 01/19/2022, 3:16 PM

## 2022-01-20 ENCOUNTER — Other Ambulatory Visit: Payer: Self-pay

## 2022-01-20 ENCOUNTER — Inpatient Hospital Stay (HOSPITAL_BASED_OUTPATIENT_CLINIC_OR_DEPARTMENT_OTHER): Payer: Medicare Other | Admitting: Medical Oncology

## 2022-01-20 ENCOUNTER — Inpatient Hospital Stay: Payer: Medicare Other | Attending: Hematology & Oncology

## 2022-01-20 ENCOUNTER — Ambulatory Visit (HOSPITAL_BASED_OUTPATIENT_CLINIC_OR_DEPARTMENT_OTHER)
Admission: RE | Admit: 2022-01-20 | Discharge: 2022-01-20 | Disposition: A | Discharge status: Home / Self Care | Payer: Medicare Other | Source: Ambulatory Visit | Attending: Medical Oncology | Admitting: Medical Oncology

## 2022-01-20 ENCOUNTER — Encounter: Payer: Self-pay | Admitting: Medical Oncology

## 2022-01-20 VITALS — BP 127/77 | HR 84 | Temp 99.3°F | Resp 18 | Ht 59.0 in | Wt 113.4 lb

## 2022-01-20 DIAGNOSIS — D509 Iron deficiency anemia, unspecified: Secondary | ICD-10-CM

## 2022-01-20 DIAGNOSIS — N189 Chronic kidney disease, unspecified: Secondary | ICD-10-CM | POA: Insufficient documentation

## 2022-01-20 DIAGNOSIS — N183 Chronic kidney disease, stage 3 unspecified: Secondary | ICD-10-CM | POA: Diagnosis not present

## 2022-01-20 DIAGNOSIS — R058 Other specified cough: Secondary | ICD-10-CM

## 2022-01-20 DIAGNOSIS — R509 Fever, unspecified: Secondary | ICD-10-CM

## 2022-01-20 DIAGNOSIS — D631 Anemia in chronic kidney disease: Secondary | ICD-10-CM | POA: Diagnosis not present

## 2022-01-20 DIAGNOSIS — R7989 Other specified abnormal findings of blood chemistry: Secondary | ICD-10-CM

## 2022-01-20 DIAGNOSIS — D709 Neutropenia, unspecified: Secondary | ICD-10-CM | POA: Diagnosis present

## 2022-01-20 LAB — CMP (CANCER CENTER ONLY)
ALT: 8 U/L (ref 0–44)
AST: 20 U/L (ref 15–41)
Albumin: 4.3 g/dL (ref 3.5–5.0)
Alkaline Phosphatase: 52 U/L (ref 38–126)
Anion gap: 11 (ref 5–15)
BUN: 27 mg/dL — ABNORMAL HIGH (ref 8–23)
CO2: 27 mmol/L (ref 22–32)
Calcium: 11.2 mg/dL — ABNORMAL HIGH (ref 8.9–10.3)
Chloride: 96 mmol/L — ABNORMAL LOW (ref 98–111)
Creatinine: 1.37 mg/dL — ABNORMAL HIGH (ref 0.44–1.00)
GFR, Estimated: 42 mL/min — ABNORMAL LOW (ref >60)
Glucose, Bld: 107 mg/dL — ABNORMAL HIGH (ref 70–99)
Potassium: 4.7 mmol/L (ref 3.5–5.1)
Sodium: 134 mmol/L — ABNORMAL LOW (ref 135–145)
Total Bilirubin: 0.4 mg/dL (ref 0.3–1.2)
Total Protein: 8.4 g/dL — ABNORMAL HIGH (ref 6.5–8.1)

## 2022-01-20 LAB — CBC WITH DIFFERENTIAL (CANCER CENTER ONLY)
Abs Immature Granulocytes: 0.19 10*3/uL — ABNORMAL HIGH (ref 0.00–0.07)
Basophils Absolute: 0 10*3/uL (ref 0.0–0.1)
Basophils Relative: 0 %
Eosinophils Absolute: 0.3 10*3/uL (ref 0.0–0.5)
Eosinophils Relative: 1 %
HCT: 37.8 % (ref 36.0–46.0)
Hemoglobin: 12.1 g/dL (ref 12.0–15.0)
Immature Granulocytes: 1 %
Lymphocytes Relative: 4 %
Lymphs Abs: 0.8 10*3/uL (ref 0.7–4.0)
MCH: 27.4 pg (ref 26.0–34.0)
MCHC: 32 g/dL (ref 30.0–36.0)
MCV: 85.5 fL (ref 80.0–100.0)
Monocytes Absolute: 0.6 10*3/uL (ref 0.1–1.0)
Monocytes Relative: 3 %
Neutro Abs: 16.7 10*3/uL — ABNORMAL HIGH (ref 1.7–7.7)
Neutrophils Relative %: 91 %
Platelet Count: 416 10*3/uL — ABNORMAL HIGH (ref 150–400)
RBC: 4.42 MIL/uL (ref 3.87–5.11)
RDW: 16.1 % — ABNORMAL HIGH (ref 11.5–15.5)
WBC Count: 18.6 10*3/uL — ABNORMAL HIGH (ref 4.0–10.5)
nRBC: 0 % (ref 0.0–0.2)

## 2022-01-20 LAB — RETICULOCYTES
Immature Retic Fract: 22.8 % — ABNORMAL HIGH (ref 2.3–15.9)
RBC.: 4.47 MIL/uL (ref 3.87–5.11)
Retic Count, Absolute: 107.7 10*3/uL (ref 19.0–186.0)
Retic Ct Pct: 2.4 % (ref 0.4–3.1)

## 2022-01-20 LAB — FERRITIN: Ferritin: 92 ng/mL (ref 11–307)

## 2022-01-20 LAB — SAVE SMEAR(SSMR), FOR PROVIDER SLIDE REVIEW

## 2022-01-20 LAB — LACTATE DEHYDROGENASE: LDH: 196 U/L — ABNORMAL HIGH (ref 98–192)

## 2022-01-20 MED ORDER — AMOXICILLIN-POT CLAVULANATE 875-125 MG PO TABS: 1.0000 | ORAL_TABLET | Freq: Two times a day (BID) | ORAL | 0 refills | Status: AC

## 2022-01-20 MED ORDER — DOXYCYCLINE MONOHYDRATE 100 MG PO CAPS: 100.0000 mg | ORAL_CAPSULE | Freq: Two times a day (BID) | ORAL | 0 refills | Status: AC

## 2022-01-20 NOTE — Patient Instructions (Signed)
Chest x ray down stair today  Home COVID-19 test recommended.   Sending in Augmentin and Doxycline to your pharmacy for you to start with dinner. Make sure to take a probiotic and stay well hydrated. These medications can cause sun sensitivity and diarrhea.   Make an appointment with your PCP next week (or our office) to make sure you are feeling better  We will see you in 2-3 weeks for your anemia check up.   Keep your 1 month follow up with you PCP so they can order a repeat chest x ray if needed.

## 2022-01-20 NOTE — Progress Notes (Signed)
Referral MD  Reason for Referral: Transient leukopenia and anemia; chronic renal insufficiency  Chief Complaint  Patient presents with   Follow-up  : My white blood cells are low and I have anemia.  HPI: Ms. Fortune is a very charming 72 year old white female.  She presents today for a 4 month follow up for her mild anemia and mild neutropenia.   She reports that she has been feeling "ok". When asked she reports that she has been fighting off what she believes are allergy symptoms of cough for the past few days. This occurs with a lot of fatigue too. Cough is described a yellow/green in nature. No SOB. No at home fevers but temp in office is low grade. 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 She denies any other recent of exposures to illness.   In terms of her anemia she is doing well. CKD is stable. She denies any bleeding or bruising episodes.   Overall, I would have to say that her performance status is ECOG 1.  Wt Readings from Last 3 Encounters:  01/20/22 113 lb 6.4 oz (51.4 kg)  10/14/21 121 lb 12.8 oz (55.2 kg)  07/09/19 115 lb (52.2 kg)     Past Medical History:  Diagnosis Date   CKD (chronic kidney disease) stage 3, GFR 30-59 ml/min (HCC)    Hypertension    Osteoporosis   :   Past Surgical History:  Procedure Laterality Date   ABDOMINAL HYSTERECTOMY     ANUS SURGERY     BLADDER SURGERY     FEMUR IM NAIL Left 06/02/2019   Procedure: INTRAMEDULLARY (IM) NAIL FEMORAL;  Surgeon: Newt Minion, MD;  Location: [facility];  Service: Orthopedics;  Laterality: Left;  :   Current Outpatient Medications:    acetaminophen (TYLENOL) 500 MG tablet, Take 1 tablet (500 mg total) by mouth every 6 (six) hours as needed for mild pain., Disp: 30 tablet, Rfl: 0   amLODipine (NORVASC) 5 MG tablet, Take 5 mg by mouth daily., Disp: , Rfl:    amoxicillin-clavulanate (AUGMENTIN) 875-125 MG tablet, Take 1 tablet by mouth 2 (two) times daily for 10 days., Disp: 20  tablet, Rfl: 0   Cholecalciferol (VITAMIN D3) 50 MCG (2000 UT) TABS, Take 2,000 Units by mouth daily., Disp: , Rfl:    doxycycline (MONODOX) 100 MG capsule, Take 1 capsule (100 mg total) by mouth 2 (two) times daily for 10 days., Disp: 20 capsule, Rfl: 0   fenofibrate (TRICOR) 145 MG tablet, Take 145 mg by mouth daily., Disp: , Rfl:    fexofenadine (ALLEGRA) 180 MG tablet, Take 180 mg by mouth daily., Disp: , Rfl:    fluticasone (FLONASE) 50 MCG/ACT nasal spray, Place 1 spray into both nostrils daily. , Disp: , Rfl:    Iron-FA-B Cmp-C-Biot-Probiotic (FUSION PLUS) CAPS, Take 1 tablet by mouth daily after breakfast., Disp: 30 capsule, Rfl: 12   losartan (COZAAR) 50 MG tablet, Take 50 mg by mouth 2 (two) times daily., Disp: , Rfl:    Magnesium 250 MG TABS, Take 250 mg by mouth at bedtime., Disp: , Rfl:    Multiple Vitamins-Minerals (PRESERVISION AREDS 2) CAPS, Take 1 capsule by mouth 2 (two) times daily., Disp: , Rfl:    pregabalin (LYRICA) 50 MG capsule, TAKE 1 CAPSULE(50 MG) BY MOUTH TWICE DAILY IN THE MORNING AND AT BEDTIME, Disp: 60 capsule, Rfl: 2   sertraline (ZOLOFT) 50 MG tablet, Take 50 mg by mouth daily., Disp: , Rfl:  temazepam (RESTORIL) 15 MG capsule, Take 15 mg by mouth at bedtime., Disp: , Rfl: :  :   Allergies  Allergen Reactions   Codeine Other (See Comments)    Abd pain   Erythromycin Other (See Comments)    Abd pain  :   Family History  Problem Relation Age of Onset   Heart attack Father    Diabetes Neg Hx    Hyperlipidemia Neg Hx    Hypertension Neg Hx   :   Social History   Socioeconomic History   Marital status: Married    Spouse name: Not on file   Number of children: Not on file   Years of education: Not on file   Highest education level: Not on file  Occupational History   Not on file  Tobacco Use   Smoking status: Never   Smokeless tobacco: Never  Vaping Use   Vaping Use: Never used  Substance and Sexual Activity   Alcohol use: No   Drug  use: No   Sexual activity: Not on file  Other Topics Concern   Not on file  Social History Narrative   Not on file   Social Determinants of Health   Financial Resource Strain: Not on file  Food Insecurity: Not on file  Transportation Needs: Not on file  Physical Activity: Not on file  Stress: Not on file  Social Connections: Not on file  Intimate Partner Violence: Not on file  :  Review of Systems  Constitutional:  Positive for malaise/fatigue and weight loss. Negative for chills and fever.  HENT: Negative.    Eyes: Negative.   Respiratory:  Positive for cough. Negative for shortness of breath and wheezing.   Cardiovascular: Negative.  Negative for chest pain and leg swelling.  Gastrointestinal: Negative.  Negative for blood in stool and melena.  Genitourinary: Negative.   Musculoskeletal: Negative.   Skin: Negative.  Negative for rash.  Neurological: Negative.   Endo/Heme/Allergies: Negative.   Psychiatric/Behavioral: Negative.     Vitals:   01/20/22 1350  BP: 127/77  Pulse: 84  Resp: 18  Temp: 99.3 F (37.4 C)  SpO2: 96%    Physical Exam Vitals reviewed.  HENT:     Head: Normocephalic and atraumatic.  Eyes:     Pupils: Pupils are equal, round, and reactive to light.  Cardiovascular:     Rate and Rhythm: Normal rate and regular rhythm.     Heart sounds: Normal heart sounds.  Pulmonary:     Effort: Pulmonary effort is normal. No respiratory distress.     Breath sounds: Rhonchi (bilateral bases) present. No wheezing or rales.  Abdominal:     General: Bowel sounds are normal.     Palpations: Abdomen is soft.  Musculoskeletal:        General: No tenderness or deformity. Normal range of motion.     Cervical back: Normal range of motion.  Lymphadenopathy:     Cervical: Cervical adenopathy present.  Skin:    General: Skin is warm and dry.     Findings: No erythema or rash.  Neurological:     General: No focal deficit present.     Mental Status: She is  alert and oriented to person, place, and time. Mental status is at baseline.  Psychiatric:        Behavior: Behavior normal.        Thought Content: Thought content normal.        Judgment: Judgment normal.     Recent  Labs    01/20/22 1329  WBC 18.6*  HGB 12.1  HCT 37.8  PLT 416*   Encounter Diagnoses  Name Primary?   Productive cough Yes   Fever, unspecified fever cause    Iron deficiency anemia, unspecified iron deficiency anemia type    Stage 3 chronic kidney disease, unspecified whether stage 3a or 3b CKD (HCC)    Abnormal CBC     Assessment and Plan: Ms. Brecht is a very nice 72 year old white female.    Neutropenia: Now has leukocytosis with band shift. Low grade fever, rhonchi of bilateral lungs with productive sputum. Chest x ray pending. Recommended home COVID-19 test- if positive alert me and I will send in appropriate antiviral. Covering for CAP with Augmentin and doxycyline- reviewed common potential side effects. She does not appear septic however we reviewed precautions and to seek medical attention should she worsen. Discussed that PCP needs to see her next week for vitals and exam however if they can not accommodate I would love to see her for follow up. She will also likely need a follow up chest x ray in 1 month to follow up on likely abnormal findings to ensure resolution- she has a PCP appointment already scheduled at this time. We will plan to see her back in 2-3 weeks for recheck labs/provider visit   Anemia: Normal Hgb today. Normal MCV. CKD stable. We will plan to follow at this time. Iron labs pending.

## 2022-01-21 LAB — IRON AND IRON BINDING CAPACITY (CC-WL,HP ONLY)
Iron: 28 ug/dL (ref 28–170)
Saturation Ratios: 6 % — ABNORMAL LOW (ref 10.4–31.8)
TIBC: 459 ug/dL — ABNORMAL HIGH (ref 250–450)
UIBC: 431 ug/dL (ref 148–442)

## 2022-01-24 ENCOUNTER — Encounter: Payer: Medicare Other | Admitting: Physical Therapy

## 2022-01-30 NOTE — Therapy (Incomplete)
OUTPATIENT PHYSICAL THERAPY TREATMENT  Patient Name: Julia Mora MRN: [id number] DOB:Nov 29, 1950, 72 y.o., female Today's Date: 01/19/2022  END OF SESSION:  PT End of Session - 01/19/22 1409     Visit Number 4    Number of Visits 16    Date for PT Re-Evaluation 03/02/22    Authorization Type BCBS Medicare    Progress Note Due on Visit 10    PT Start Time 1316    PT Stop Time 1359    PT Time Calculation (min) 43 min    Activity Tolerance Patient tolerated treatment well    Behavior During Therapy WFL for tasks assessed/performed              Past Medical History:  Diagnosis Date   CKD (chronic kidney disease) stage 3, GFR 30-59 ml/min (HCC)    Hypertension    Osteoporosis    Past Surgical History:  Procedure Laterality Date   ABDOMINAL HYSTERECTOMY     ANUS SURGERY     BLADDER SURGERY     FEMUR IM NAIL Left 06/02/2019   Procedure: INTRAMEDULLARY (IM) NAIL FEMORAL;  Surgeon: Newt Minion, MD;  Location: [facility];  Service: Orthopedics;  Laterality: Left;   Patient Active Problem List   Diagnosis Date Noted   Fall    Closed left femoral fracture ([place]) 06/01/2019   Essential hypertension 06/01/2019   CKD (chronic kidney disease) stage 3, GFR 30-59 ml/min ([place]) 06/01/2019   Left knee injury 08/21/2012   Right foot pain 07/24/2012    PCP: Loraine Leriche., MD  REFERRING PROVIDER: Myrle Sheng, *  REFERRING DIAG: G57.02 (ICD-10-CM) - Piriformis syndrome of left side  THERAPY DIAG:  Pain in left hip  Muscle weakness (generalized)  Unsteadiness on feet  Sciatica, left side  Radiculopathy, lumbar region  Cramp and spasm  Rationale for Evaluation and Treatment: Rehabilitation  ONSET DATE: ~3 years  SUBJECTIVE:   SUBJECTIVE STATEMENT: ***  PERTINENT HISTORY: Had PT in the past for her back, history of scoliosis  PAIN:  Are you having pain? Yes: NPRS scale: 3/10 Pain location: Posterior L thigh Pain description: Sharp pain  occasionally, pins/needles occasionally, burning Aggravating factors: Sleeping/laying down at night Relieving factors: Heating pad, medications  PRECAUTIONS: None  WEIGHT BEARING RESTRICTIONS: No  FALLS:  Has patient fallen in last 6 months? No  LIVING ENVIRONMENT: Lives with: lives with their spouse Lives in: House/apartment Stairs: No issues with stairs Has following equipment at home: None  OCCUPATION: Retired Education officer, museum  PLOF: Independent  PATIENT GOALS: Improve pain/sleep; decrease having to use pain medicine  NEXT MD VISIT:   OBJECTIVE:   DIAGNOSTIC FINDINGS: Hip x-ray from 06/07/19: 1. Prior ORIF left femur. Hardware intact. Anatomic alignment. Upper left femoral shaft fracture again noted. Some degree of early callus formation noted.   2. Sclerotic densities in the left sacrum and left pubis. Although these may represent bone islands, blastic metastatic disease cannot be excluded. Whole-body bone scan can be obtained to further Evaluate  MRI lumbar spine 11/14/20: IMPRESSION: 1. Lumbar spine spondylosis as described above. 2. No acute osseous injury of the lumbar spine.   PATIENT SURVEYS:  LEFS 72/80 = 90%  COGNITION: Overall cognitive status: Within functional limits for tasks assessed     SENSATION: L lateral knee decreased sensation vs R  EDEMA:  None  POSTURE: R iliac crest higher than L in standing; R lateral hip shift  MUSCLE LENGTH: Hamstrings: Right >90 deg; Left 80 deg Thomas test:  Right 10 deg below plinth; Left 10 deg below plinth  PALPATION: Pt reports L>R TTP in piriformis, glutes, lumbar paraspinals, hamstrings, gastroc   LOWER EXTREMITY MMT:  MMT Right eval Left eval  Hip flexion 4+ 4+  Hip extension 3 without trunk compensation 3- without trunk compensation  Hip abduction 3+ 3+  Hip adduction    Hip internal rotation    Hip external rotation    Knee flexion 5 4-  Knee extension 5 4+  Ankle dorsiflexion    Ankle  plantarflexion    Ankle inversion    Ankle eversion     (Blank rows = not tested)  LOWER EXTREMITY SPECIAL TESTS:  Hip special tests: Luisa Hart (FABER) test: positive , Thomas test: negative, and Ely's test: negative can feel in front of thigh bilat Slump test: (+) L, SLR: Neg  FUNCTIONAL TESTS:  5 times sit to stand: 11 sec no UE support  GAIT: Distance walked: 100' Assistive device utilized: None Level of assistance: Complete Independence Comments: WFL   TODAY'S TREATMENT:                                                                                                                              DATE:   01/31/22 Therapeutic Exercise: to improve strength and mobility.  Demo, verbal and tactile cues throughout for technique. Supine PPT with ball squeeze 10x5"  Supine bridge with ball squeeze 10x5" Seated clams RTB 10x5" STS with RTB at knees x 10 Seated ER bil AROM x 10  Seated piriformis stretch with leg on table x 30 sec bil  01/19/22 Therapeutic Exercise: to improve strength and mobility.  Demo, verbal and tactile cues throughout for technique. 5xSTS- 11 sec no UE support DGI: 17/24 Supine PPT with ball squeeze 10x5"  Supine bridge with ball squeeze 10x5" Seated clams RTB 10x5" STS with RTB at knees x 10 Seated ER bil AROM x 10  Seated piriformis stretch with leg on table x 30 sec bil  01/17/22 Therapeutic Exercise: to improve strength and mobility.  Demo, verbal and tactile cues throughout for technique. Bike L1 x 6 min Seated piriformis stretches - preferred sitting with leg crossed and bending forward, felt best stretch.  LTR x 10  Windshield wipers x10 to gradually increasing ROM and stretch PPT x 10  PPT with ball squeeze 10 x 5 sec hold Bridges  with ball squeeze x 10 - cues for breathing.   Manual Therapy: to decrease muscle spasm and pain and improve mobility STM/TPR to L piriformis and glutes, skilled palpation and monitoring during dry needling. Trigger  Point Dry-Needling  Treatment instructions: Expect mild to moderate muscle soreness. S/S of pneumothorax if dry needled over a lung field, and to seek immediate medical attention should they occur. Patient verbalized understanding of these instructions and education. Patient Consent Given: Yes Education handout provided: Yes Muscles treated: L glut med, L piriformis Electrical stimulation performed: No Parameters: N/A Treatment response/outcome: Twitch Response  Elicited and Palpable Increase in Muscle Length  01/13/22 Figure 4 stretch 2x30 sec  Piriformis stretch 2x30 sec   Hamstring stretch with strap 2x30 sec  Sciatic nerve glide x10 Bridges x 10  SLR x 10 bil S/L clamshells x 10 R/L RTB Supine marches RTB x 10 bil     PATIENT EDUCATION:  Education details: Exam findings, POC, initial HEP Person educated: Patient Education method: Explanation, Demonstration, and Handouts Education comprehension: verbalized understanding, returned demonstration, and needs further education  HOME EXERCISE PROGRAM: Access Code: [email or web address] URL: [email or web address] Date: 01/05/2022 Prepared by: Estill Bamberg April Thurnell Garbe  Exercises - Supine Figure 4 Piriformis Stretch  - 3 x daily - 7 x weekly - 2 sets - 30 sec hold - Supine Piriformis Stretch with Foot on Ground  - 3 x daily - 7 x weekly - 2 sets - 30 sec hold - Supine Hamstring Stretch with Strap  - 1 x daily - 7 x weekly - 2 sets - 30 sec hold - Supine Sciatic Nerve Glide  - 1 x daily - 7 x weekly - 1 sets - 10 reps  ASSESSMENT:  CLINICAL IMPRESSION: ***   OBJECTIVE IMPAIRMENTS: decreased activity tolerance, decreased balance, difficulty walking, decreased ROM, decreased strength, increased fascial restrictions, increased muscle spasms, postural dysfunction, and pain.   ACTIVITY LIMITATIONS: sleeping and locomotion level  PARTICIPATION LIMITATIONS: meal prep and community activity  PERSONAL FACTORS: Past/current  experiences and Time since onset of injury/illness/exacerbation are also affecting patient's functional outcome.   REHAB POTENTIAL: Good  CLINICAL DECISION MAKING: Evolving/moderate complexity  EVALUATION COMPLEXITY: Moderate   GOALS: Goals reviewed with patient? Yes  SHORT TERM GOALS: Target date: 02/02/2022  Pt will be ind with initial HEP Baseline: Goal status: IN PROGRESS  2.  PT will assess 5 x STS within the next 2 visits Baseline:  Goal status: MET- 01/19/22  3.  PT will assess DGI within the next 2 visits Baseline:  Goal status: MET-01/19/22   LONG TERM GOALS: Target date: 03/02/2022   Pt will be ind with advancing and progressing HEP Baseline:  Goal status: IN PROGRESS  2.  Pt will demo improved bilat hip strength to at least 4+/5 for improved transfers and mobility Baseline:  Goal status: IN PROGRESS  3.  Pt will report decrease in pain by >/=50% Baseline:  Goal status: IN PROGRESS  4.  Pt will demo L = R SLR to demo improving neural tension and hamstring length Baseline:  Goal status: IN PROGRESS  5.  Pt will have increased LEFS to >/= 95% Baseline:  Goal status: IN PROGRESS  PLAN:  PT FREQUENCY: 2x/week  PT DURATION: 8 weeks  PLANNED INTERVENTIONS: Therapeutic exercises, Therapeutic activity, Neuromuscular re-education, Balance training, Gait training, Patient/Family education, Self Care, Joint mobilization, Stair training, Aquatic Therapy, Dry Needling, Electrical stimulation, Spinal mobilization, Cryotherapy, Moist heat, Taping, Ionotophoresis 4mg /ml Dexamethasone, Manual therapy, and Re-evaluation  PLAN FOR NEXT SESSION:  Continue to hip/core/glute strengthening and hip mobility; incorporate dynamic, higher level balance; Manual work as needed.    Artist Pais, PTA 01/19/2022, 3:16 PM

## 2022-01-31 ENCOUNTER — Ambulatory Visit: Payer: Medicare Other

## 2022-01-31 DIAGNOSIS — M5416 Radiculopathy, lumbar region: Secondary | ICD-10-CM

## 2022-01-31 DIAGNOSIS — R2681 Unsteadiness on feet: Secondary | ICD-10-CM

## 2022-01-31 DIAGNOSIS — M6281 Muscle weakness (generalized): Secondary | ICD-10-CM

## 2022-01-31 DIAGNOSIS — M25552 Pain in left hip: Secondary | ICD-10-CM

## 2022-01-31 DIAGNOSIS — M5432 Sciatica, left side: Secondary | ICD-10-CM

## 2022-01-31 DIAGNOSIS — R252 Cramp and spasm: Secondary | ICD-10-CM

## 2022-01-31 NOTE — Therapy (Addendum)
OUTPATIENT PHYSICAL THERAPY TREATMENT  Patient Name: Julia Mora MRN: [id number] DOB:07-15-1950, 72 y.o., female Today's Date: 01/31/2022  END OF SESSION:  PT End of Session - 01/31/22 1155     Visit Number 5    Number of Visits 16    Date for PT Re-Evaluation 03/02/22    Authorization Type BCBS Medicare    Progress Note Due on Visit 10    PT Start Time 1105    PT Stop Time 1147    PT Time Calculation (min) 42 min    Activity Tolerance Patient tolerated treatment well    Behavior During Therapy WFL for tasks assessed/performed               Past Medical History:  Diagnosis Date   CKD (chronic kidney disease) stage 3, GFR 30-59 ml/min (HCC)    Hypertension    Osteoporosis    Past Surgical History:  Procedure Laterality Date   ABDOMINAL HYSTERECTOMY     ANUS SURGERY     BLADDER SURGERY     FEMUR IM NAIL Left 06/02/2019   Procedure: INTRAMEDULLARY (IM) NAIL FEMORAL;  Surgeon: Newt Minion, MD;  Location: [facility];  Service: Orthopedics;  Laterality: Left;   Patient Active Problem List   Diagnosis Date Noted   Fall    Closed left femoral fracture ([place]) 06/01/2019   Essential hypertension 06/01/2019   CKD (chronic kidney disease) stage 3, GFR 30-59 ml/min ([place]) 06/01/2019   Left knee injury 08/21/2012   Right foot pain 07/24/2012    PCP: Loraine Leriche., MD  REFERRING PROVIDER: Myrle Sheng, *  REFERRING DIAG: G57.02 (ICD-10-CM) - Piriformis syndrome of left side  THERAPY DIAG:  Pain in left hip  Muscle weakness (generalized)  Unsteadiness on feet  Sciatica, left side  Radiculopathy, lumbar region  Cramp and spasm  Rationale for Evaluation and Treatment: Rehabilitation  ONSET DATE: ~3 years  SUBJECTIVE:   SUBJECTIVE STATEMENT: Patient reports having bad pain last night, however none this morning. She has been battling Pneumonia for the past 2 weeks so has not been as active.   PERTINENT HISTORY: Had PT in the past for her  back, history of scoliosis  PAIN:  Are you having pain? No  PRECAUTIONS: None  WEIGHT BEARING RESTRICTIONS: No  FALLS:  Has patient fallen in last 6 months? No  LIVING ENVIRONMENT: Lives with: lives with their spouse Lives in: House/apartment Stairs: No issues with stairs Has following equipment at home: None  OCCUPATION: Retired Education officer, museum  PLOF: Independent  PATIENT GOALS: Improve pain/sleep; decrease having to use pain medicine  NEXT MD VISIT:   OBJECTIVE:   DIAGNOSTIC FINDINGS: Hip x-ray from 06/07/19: 1. Prior ORIF left femur. Hardware intact. Anatomic alignment. Upper left femoral shaft fracture again noted. Some degree of early callus formation noted.   2. Sclerotic densities in the left sacrum and left pubis. Although these may represent bone islands, blastic metastatic disease cannot be excluded. Whole-body bone scan can be obtained to further Evaluate  MRI lumbar spine 11/14/20: IMPRESSION: 1. Lumbar spine spondylosis as described above. 2. No acute osseous injury of the lumbar spine.   PATIENT SURVEYS:  LEFS 72/80 = 90%  COGNITION: Overall cognitive status: Within functional limits for tasks assessed     SENSATION: L lateral knee decreased sensation vs R  EDEMA:  None  POSTURE: R iliac crest higher than L in standing; R lateral hip shift  MUSCLE LENGTH: Hamstrings: Right >90 deg; Left 80 deg Marcello Moores  test: Right 10 deg below plinth; Left 10 deg below plinth  PALPATION: Pt reports L>R TTP in piriformis, glutes, lumbar paraspinals, hamstrings, gastroc   LOWER EXTREMITY MMT:  MMT Right eval Left eval  Hip flexion 4+ 4+  Hip extension 3 without trunk compensation 3- without trunk compensation  Hip abduction 3+ 3+  Hip adduction    Hip internal rotation    Hip external rotation    Knee flexion 5 4-  Knee extension 5 4+  Ankle dorsiflexion    Ankle plantarflexion    Ankle inversion    Ankle eversion     (Blank rows = not  tested)  LOWER EXTREMITY SPECIAL TESTS:  Hip special tests: Luisa Hart (FABER) test: positive , Thomas test: negative, and Ely's test: negative can feel in front of thigh bilat Slump test: (+) L, SLR: Neg  FUNCTIONAL TESTS:  5 times sit to stand: 11 sec no UE support  GAIT: Distance walked: 100' Assistive device utilized: None Level of assistance: Complete Independence Comments: WFL   TODAY'S TREATMENT:                                                                                                                              DATE:   Therapeutic Exercise: to improve strength and mobility.  Demo, verbal and tactile cues throughout for technique. Nustep L5x76min Seated piriformis stretch leg on table x 30 sec Supine hamstring stretch x 30 sec w/ strap Supine sciatic nerve glide x 10 Supine SLR x 10 bil Seated ER AROM x 10 bil Standing hip abduction x 10 bil 1# Standing hip extension x 10 bil 1# Standing marches x 10 bil 1#  01/19/22 Therapeutic Exercise: to improve strength and mobility.  Demo, verbal and tactile cues throughout for technique. 5xSTS- 11 sec no UE support DGI: 17/24 Supine PPT with ball squeeze 10x5"  Supine bridge with ball squeeze 10x5" Seated clams RTB 10x5" STS with RTB at knees x 10 Seated ER bil AROM x 10  Seated piriformis stretch with leg on table x 30 sec bil  01/17/22 Therapeutic Exercise: to improve strength and mobility.  Demo, verbal and tactile cues throughout for technique. Bike L1 x 6 min Seated piriformis stretches - preferred sitting with leg crossed and bending forward, felt best stretch.  LTR x 10  Windshield wipers x10 to gradually increasing ROM and stretch PPT x 10  PPT with ball squeeze 10 x 5 sec hold Bridges  with ball squeeze x 10 - cues for breathing.   Manual Therapy: to decrease muscle spasm and pain and improve mobility STM/TPR to L piriformis and glutes, skilled palpation and monitoring during dry needling. Trigger Point  Dry-Needling  Treatment instructions: Expect mild to moderate muscle soreness. S/S of pneumothorax if dry needled over a lung field, and to seek immediate medical attention should they occur. Patient verbalized understanding of these instructions and education. Patient Consent Given: Yes Education handout provided: Yes Muscles treated: L  glut med, L piriformis Electrical stimulation performed: No Parameters: N/A Treatment response/outcome: Twitch Response Elicited and Palpable Increase in Muscle Length  01/13/22 Figure 4 stretch 2x30 sec  Piriformis stretch 2x30 sec   Hamstring stretch with strap 2x30 sec  Sciatic nerve glide x10 Bridges x 10  SLR x 10 bil S/L clamshells x 10 R/L RTB Supine marches RTB x 10 bil     PATIENT EDUCATION:  Education details: Exam findings, POC, initial HEP Person educated: Patient Education method: Explanation, Demonstration, and Handouts Education comprehension: verbalized understanding, returned demonstration, and needs further education  HOME EXERCISE PROGRAM: Access Code: [email or web address] URL: [email or web address] Date: 01/05/2022 Prepared by: Estill Bamberg April Thurnell Garbe  Exercises - Supine Figure 4 Piriformis Stretch  - 3 x daily - 7 x weekly - 2 sets - 30 sec hold - Supine Piriformis Stretch with Foot on Ground  - 3 x daily - 7 x weekly - 2 sets - 30 sec hold - Supine Hamstring Stretch with Strap  - 1 x daily - 7 x weekly - 2 sets - 30 sec hold - Supine Sciatic Nerve Glide  - 1 x daily - 7 x weekly - 1 sets - 10 reps  ASSESSMENT:  CLINICAL IMPRESSION: Advanced patient through [employer] to improve lumbar strength and mobility. No Increased pain with interventions. Reviewed HEP for clarification of exercises, STG #1 met. Added standing hip strengthening today, with pt showing good tolerance to exercises. Had one instance of LOB with marches but able to self recover.   OBJECTIVE IMPAIRMENTS: decreased activity tolerance, decreased  balance, difficulty walking, decreased ROM, decreased strength, increased fascial restrictions, increased muscle spasms, postural dysfunction, and pain.   ACTIVITY LIMITATIONS: sleeping and locomotion level  PARTICIPATION LIMITATIONS: meal prep and community activity  PERSONAL FACTORS: Past/current experiences and Time since onset of injury/illness/exacerbation are also affecting patient's functional outcome.   REHAB POTENTIAL: Good  CLINICAL DECISION MAKING: Evolving/moderate complexity  EVALUATION COMPLEXITY: Moderate   GOALS: Goals reviewed with patient? Yes  SHORT TERM GOALS: Target date: 02/02/2022  Pt will be ind with initial HEP Baseline: Goal status: MET- 01/31/22  2.  PT will assess 5 x STS within the next 2 visits Baseline:  Goal status: MET- 01/19/22  3.  PT will assess DGI within the next 2 visits Baseline:  Goal status: MET-01/19/22   LONG TERM GOALS: Target date: 03/02/2022   Pt will be ind with advancing and progressing HEP Baseline:  Goal status: IN PROGRESS  2.  Pt will demo improved bilat hip strength to at least 4+/5 for improved transfers and mobility Baseline:  Goal status: IN PROGRESS  3.  Pt will report decrease in pain by >/=50% Baseline:  Goal status: IN PROGRESS  4.  Pt will demo L = R SLR to demo improving neural tension and hamstring length Baseline:  Goal status: IN PROGRESS  5.  Pt will have increased LEFS to >/= 95% Baseline:  Goal status: IN PROGRESS  PLAN:  PT FREQUENCY: 2x/week  PT DURATION: 8 weeks  PLANNED INTERVENTIONS: Therapeutic exercises, Therapeutic activity, Neuromuscular re-education, Balance training, Gait training, Patient/Family education, Self Care, Joint mobilization, Stair training, Aquatic Therapy, Dry Needling, Electrical stimulation, Spinal mobilization, Cryotherapy, Moist heat, Taping, Ionotophoresis 4mg /ml Dexamethasone, Manual therapy, and Re-evaluation  PLAN FOR NEXT SESSION:  Continue to  hip/core/glute strengthening and hip mobility; incorporate dynamic, higher level balance; Manual work as needed.    Artist Pais, PTA 01/31/2022, 12:03 PM

## 2022-02-02 ENCOUNTER — Ambulatory Visit: Payer: Medicare Other | Admitting: Physical Therapy

## 2022-02-02 ENCOUNTER — Encounter: Payer: Self-pay | Admitting: Physical Therapy

## 2022-02-02 DIAGNOSIS — M5432 Sciatica, left side: Secondary | ICD-10-CM

## 2022-02-02 DIAGNOSIS — R2681 Unsteadiness on feet: Secondary | ICD-10-CM

## 2022-02-02 DIAGNOSIS — M25552 Pain in left hip: Secondary | ICD-10-CM | POA: Diagnosis not present

## 2022-02-02 DIAGNOSIS — R252 Cramp and spasm: Secondary | ICD-10-CM

## 2022-02-02 DIAGNOSIS — M5416 Radiculopathy, lumbar region: Secondary | ICD-10-CM

## 2022-02-02 DIAGNOSIS — M6281 Muscle weakness (generalized): Secondary | ICD-10-CM

## 2022-02-02 NOTE — Therapy (Signed)
OUTPATIENT PHYSICAL THERAPY TREATMENT  Patient Name: MARJI KUEHNEL MRN: [id number] DOB:1950-12-15, 72 y.o., female Today's Date: 02/02/2022  END OF SESSION:  PT End of Session - 02/02/22 1314     Visit Number 6    Number of Visits 16    Date for PT Re-Evaluation 03/02/22    Authorization Type BCBS Medicare    Progress Note Due on Visit 10    PT Start Time 1315    PT Stop Time 1358    PT Time Calculation (min) 43 min    Activity Tolerance Patient tolerated treatment well    Behavior During Therapy WFL for tasks assessed/performed               Past Medical History:  Diagnosis Date   CKD (chronic kidney disease) stage 3, GFR 30-59 ml/min (HCC)    Hypertension    Osteoporosis    Past Surgical History:  Procedure Laterality Date   ABDOMINAL HYSTERECTOMY     ANUS SURGERY     BLADDER SURGERY     FEMUR IM NAIL Left 06/02/2019   Procedure: INTRAMEDULLARY (IM) NAIL FEMORAL;  Surgeon: Newt Minion, MD;  Location: [facility];  Service: Orthopedics;  Laterality: Left;   Patient Active Problem List   Diagnosis Date Noted   Fall    Closed left femoral fracture ([place]) 06/01/2019   Essential hypertension 06/01/2019   CKD (chronic kidney disease) stage 3, GFR 30-59 ml/min ([place]) 06/01/2019   Left knee injury 08/21/2012   Right foot pain 07/24/2012    PCP: Loraine Leriche., MD  REFERRING PROVIDER: Myrle Sheng, *  REFERRING DIAG: G57.02 (ICD-10-CM) - Piriformis syndrome of left side  THERAPY DIAG:  Pain in left hip  Muscle weakness (generalized)  Unsteadiness on feet  Sciatica, left side  Radiculopathy, lumbar region  Cramp and spasm  Rationale for Evaluation and Treatment: Rehabilitation  ONSET DATE: ~3 years  SUBJECTIVE:   SUBJECTIVE STATEMENT: Cady Hafen Schuler reports pain has been better in general, but woke up last night at 2AM hurting, and couldn't take more pain medication.  "Thank goodness it doesn't do that every night."  PERTINENT  HISTORY: Had PT in the past for her back, history of scoliosis  PAIN:  Are you having pain? No  PRECAUTIONS: None  WEIGHT BEARING RESTRICTIONS: No  FALLS:  Has patient fallen in last 6 months? No  LIVING ENVIRONMENT: Lives with: lives with their spouse Lives in: House/apartment Stairs: No issues with stairs Has following equipment at home: None  OCCUPATION: Retired Education officer, museum  PLOF: Independent  PATIENT GOALS: Improve pain/sleep; decrease having to use pain medicine  NEXT MD VISIT:  02/03/22  OBJECTIVE:   DIAGNOSTIC FINDINGS: Hip x-ray from 06/07/19: 1. Prior ORIF left femur. Hardware intact. Anatomic alignment. Upper left femoral shaft fracture again noted. Some degree of early callus formation noted.   2. Sclerotic densities in the left sacrum and left pubis. Although these may represent bone islands, blastic metastatic disease cannot be excluded. Whole-body bone scan can be obtained to further Evaluate  MRI lumbar spine 11/14/20: IMPRESSION: 1. Lumbar spine spondylosis as described above. 2. No acute osseous injury of the lumbar spine.   PATIENT SURVEYS:  LEFS 72/80 = 90%  COGNITION: Overall cognitive status: Within functional limits for tasks assessed     SENSATION: L lateral knee decreased sensation vs R  EDEMA:  None  POSTURE: R iliac crest higher than L in standing; R lateral hip shift  MUSCLE LENGTH: Hamstrings: Right >  90 deg; Left 80 deg Thomas test: Right 10 deg below plinth; Left 10 deg below plinth  PALPATION: Pt reports L>R TTP in piriformis, glutes, lumbar paraspinals, hamstrings, gastroc   LOWER EXTREMITY MMT:  MMT Right eval Left eval  Hip flexion 4+ 4+  Hip extension 3 without trunk compensation 3- without trunk compensation  Hip abduction 3+ 3+  Hip adduction    Hip internal rotation    Hip external rotation    Knee flexion 5 4-  Knee extension 5 4+  Ankle dorsiflexion    Ankle plantarflexion    Ankle inversion     Ankle eversion     (Blank rows = not tested)  LOWER EXTREMITY SPECIAL TESTS:  Hip special tests: Saralyn Pilar (FABER) test: positive , Thomas test: negative, and Ely's test: negative can feel in front of thigh bilat Slump test: (+) L, SLR: Neg  FUNCTIONAL TESTS:  5 times sit to stand: 11 sec no UE support  GAIT: Distance walked: 100' Assistive device utilized: None Level of assistance: Complete Independence Comments: WFL   TODAY'S TREATMENT:                                                                                                                              DATE:   02/02/22 Therapeutic Exercise: to improve strength and mobility.  Demo, verbal and tactile cues throughout for technique. Nustep L5 x 6 min  Supine LTR  Supine hip IR/ER repeated stretches Supine PPT x 10 Spine bridges x 10  -with cues for TrA bracing S/L clams - progressed to GTB x 10  Manual Therapy: to decrease muscle spasm and pain and improve mobility STM/TPR to L piriformis/glut med, L QL, UPA mobs L lumbar spine, skilled palpation and monitoring during dry needling. Trigger Point Dry-Needling  Treatment instructions: Expect mild to moderate muscle soreness. S/S of pneumothorax if dry needled over a lung field, and to seek immediate medical attention should they occur. Patient verbalized understanding of these instructions and education. Patient Consent Given: Yes Education handout provided: Yes Muscles treated: L glut med, L piriformis Electrical stimulation performed: No Parameters: N/A Treatment response/outcome: Twitch Response Elicited and Palpable Increase in Muscle Length    01/31/22 Therapeutic Exercise: to improve strength and mobility.  Demo, verbal and tactile cues throughout for technique. Nustep L5x16min Seated piriformis stretch leg on table x 30 sec Supine hamstring stretch x 30 sec w/ strap Supine sciatic nerve glide x 10 Supine SLR x 10 bil Seated ER AROM x 10 bil Standing hip  abduction x 10 bil 1# Standing hip extension x 10 bil 1# Standing marches x 10 bil 1#  01/19/22 Therapeutic Exercise: to improve strength and mobility.  Demo, verbal and tactile cues throughout for technique. 5xSTS- 11 sec no UE support DGI: 17/24 Supine PPT with ball squeeze 10x5"  Supine bridge with ball squeeze 10x5" Seated clams RTB 10x5" STS with RTB at knees x 10 Seated ER bil AROM x  10  Seated piriformis stretch with leg on table x 30 sec bil  PATIENT EDUCATION:  Education details: HEP update  Person educated: Patient Education method: Explanation, Demonstration, and Handouts Education comprehension: verbalized understanding, returned demonstration, and needs further education  HOME EXERCISE PROGRAM: Access Code: [email or web address] URL: [email or web address] Date: 02/02/2022 Prepared by: Harrie Foreman  Exercises - Seated Table Piriformis Stretch  - 1 x daily - 7 x weekly - 3 sets - 30 sec hold - Supine Hamstring Stretch with Strap  - 1 x daily - 7 x weekly - 2 sets - 30 sec hold - Supine Sciatic Nerve Glide  - 1 x daily - 7 x weekly - 1 sets - 10 reps - Supine Bridge  - 1 x daily - 7 x weekly - 2 sets - 10 reps - Clam with Resistance  - 1 x daily - 7 x weekly - 2 sets - 10 reps - Supine Lower Trunk Rotation  - 1 x daily - 7 x weekly - 1 sets - 10 reps - Supine Straight Leg Raises  - 1 x daily - 7 x weekly - 2 sets - 10 reps  ASSESSMENT:  CLINICAL IMPRESSION: Daisee Centner Lacosse reports pain now primarily at night in L hip radiating down LLE.  Checked orthotics and heel lift, she has appropriate orthotics in L shoe and hips are level, noted compensated s-shaped lumbar scoliosis, so did not recommend any changes, continue to use current heel lift.  She is demonstrating improving hip strength, progressed to green theraband resistance for clamshells.  Manual therapy and dry needling to L hip with significant improvement in pain following interventions.  Lauri Purdum Obrien  continues to demonstrate potential for improvement and would benefit from continued skilled therapy to address impairments.    OBJECTIVE IMPAIRMENTS: decreased activity tolerance, decreased balance, difficulty walking, decreased ROM, decreased strength, increased fascial restrictions, increased muscle spasms, postural dysfunction, and pain.   ACTIVITY LIMITATIONS: sleeping and locomotion level  PARTICIPATION LIMITATIONS: meal prep and community activity  PERSONAL FACTORS: Past/current experiences and Time since onset of injury/illness/exacerbation are also affecting patient's functional outcome.   REHAB POTENTIAL: Good  CLINICAL DECISION MAKING: Evolving/moderate complexity  EVALUATION COMPLEXITY: Moderate   GOALS: Goals reviewed with patient? Yes  SHORT TERM GOALS: Target date: 02/02/2022  Pt will be ind with initial HEP Baseline: Goal status: MET- 01/31/22  2.  PT will assess 5 x STS within the next 2 visits Baseline:  Goal status: MET- 01/19/22  3.  PT will assess DGI within the next 2 visits Baseline:  Goal status: MET-01/19/22   LONG TERM GOALS: Target date: 03/02/2022   Pt will be ind with advancing and progressing HEP Baseline:  Goal status: IN PROGRESS  2.  Pt will demo improved bilat hip strength to at least 4+/5 for improved transfers and mobility Baseline:  Goal status: IN PROGRESS  3.  Pt will report decrease in pain by >/=50% Baseline:  Goal status: IN PROGRESS  4.  Pt will demo L = R SLR to demo improving neural tension and hamstring length Baseline:  Goal status: IN PROGRESS  5.  Pt will have increased LEFS to >/= 95% Baseline:  Goal status: IN PROGRESS  PLAN:  PT FREQUENCY: 2x/week  PT DURATION: 8 weeks  PLANNED INTERVENTIONS: Therapeutic exercises, Therapeutic activity, Neuromuscular re-education, Balance training, Gait training, Patient/Family education, Self Care, Joint mobilization, Stair training, Aquatic Therapy, Dry Needling,  Electrical stimulation, Spinal mobilization, Cryotherapy, Moist heat, Taping, Ionotophoresis 4mg /ml Dexamethasone, Manual therapy,  and Re-evaluation  PLAN FOR NEXT SESSION:  Continue to hip/core/glute strengthening and hip mobility; incorporate dynamic, higher level balance; Manual work as needed.    Rennie Natter, PT, DPT  02/02/2022, 4:01 PM

## 2022-02-03 ENCOUNTER — Encounter: Payer: Self-pay | Admitting: Medical Oncology

## 2022-02-03 ENCOUNTER — Inpatient Hospital Stay: Payer: Medicare Other | Attending: Hematology & Oncology

## 2022-02-03 ENCOUNTER — Inpatient Hospital Stay (HOSPITAL_BASED_OUTPATIENT_CLINIC_OR_DEPARTMENT_OTHER): Payer: Medicare Other | Admitting: Medical Oncology

## 2022-02-03 VITALS — BP 126/87 | HR 66 | Temp 98.3°F | Resp 17 | Wt 116.2 lb

## 2022-02-03 DIAGNOSIS — E871 Hypo-osmolality and hyponatremia: Secondary | ICD-10-CM | POA: Insufficient documentation

## 2022-02-03 DIAGNOSIS — R509 Fever, unspecified: Secondary | ICD-10-CM | POA: Diagnosis not present

## 2022-02-03 DIAGNOSIS — R058 Other specified cough: Secondary | ICD-10-CM

## 2022-02-03 DIAGNOSIS — R7989 Other specified abnormal findings of blood chemistry: Secondary | ICD-10-CM

## 2022-02-03 DIAGNOSIS — J189 Pneumonia, unspecified organism: Secondary | ICD-10-CM | POA: Diagnosis not present

## 2022-02-03 DIAGNOSIS — D649 Anemia, unspecified: Secondary | ICD-10-CM | POA: Diagnosis not present

## 2022-02-03 DIAGNOSIS — D72819 Decreased white blood cell count, unspecified: Secondary | ICD-10-CM | POA: Insufficient documentation

## 2022-02-03 DIAGNOSIS — N189 Chronic kidney disease, unspecified: Secondary | ICD-10-CM | POA: Insufficient documentation

## 2022-02-03 LAB — CMP (CANCER CENTER ONLY)
ALT: 7 U/L (ref 0–44)
AST: 20 U/L (ref 15–41)
Albumin: 4 g/dL (ref 3.5–5.0)
Alkaline Phosphatase: 54 U/L (ref 38–126)
Anion gap: 8 (ref 5–15)
BUN: 26 mg/dL — ABNORMAL HIGH (ref 8–23)
CO2: 27 mmol/L (ref 22–32)
Calcium: 10 mg/dL (ref 8.9–10.3)
Chloride: 97 mmol/L — ABNORMAL LOW (ref 98–111)
Creatinine: 1.24 mg/dL — ABNORMAL HIGH (ref 0.44–1.00)
GFR, Estimated: 47 mL/min — ABNORMAL LOW (ref >60)
Glucose, Bld: 104 mg/dL — ABNORMAL HIGH (ref 70–99)
Potassium: 4.9 mmol/L (ref 3.5–5.1)
Sodium: 132 mmol/L — ABNORMAL LOW (ref 135–145)
Total Bilirubin: 0.4 mg/dL (ref 0.3–1.2)
Total Protein: 8 g/dL (ref 6.5–8.1)

## 2022-02-03 LAB — CBC WITH DIFFERENTIAL/PLATELET
Abs Immature Granulocytes: 0.08 10*3/uL — ABNORMAL HIGH (ref 0.00–0.07)
Basophils Absolute: 0 10*3/uL (ref 0.0–0.1)
Basophils Relative: 0 %
Eosinophils Absolute: 0.2 10*3/uL (ref 0.0–0.5)
Eosinophils Relative: 2 %
HCT: 36 % (ref 36.0–46.0)
Hemoglobin: 11.5 g/dL — ABNORMAL LOW (ref 12.0–15.0)
Immature Granulocytes: 1 %
Lymphocytes Relative: 5 %
Lymphs Abs: 0.7 10*3/uL (ref 0.7–4.0)
MCH: 27.5 pg (ref 26.0–34.0)
MCHC: 31.9 g/dL (ref 30.0–36.0)
MCV: 86.1 fL (ref 80.0–100.0)
Monocytes Absolute: 0.5 10*3/uL (ref 0.1–1.0)
Monocytes Relative: 4 %
Neutro Abs: 12.1 10*3/uL — ABNORMAL HIGH (ref 1.7–7.7)
Neutrophils Relative %: 88 %
Platelets: 374 10*3/uL (ref 150–400)
RBC: 4.18 MIL/uL (ref 3.87–5.11)
RDW: 15.9 % — ABNORMAL HIGH (ref 11.5–15.5)
WBC: 13.7 10*3/uL — ABNORMAL HIGH (ref 4.0–10.5)
nRBC: 0 % (ref 0.0–0.2)

## 2022-02-03 LAB — LACTATE DEHYDROGENASE: LDH: 159 U/L (ref 98–192)

## 2022-02-03 NOTE — Progress Notes (Signed)
Referral MD  Reason for Referral: Transient leukopenia and anemia; chronic renal insufficiency  Chief Complaint  Patient presents with   Follow-up  : My white blood cells are low and I have anemia.  HPI: Ms. Julia Mora is a very charming 72 year old white female.  She presents today for a follow up for her mild anemia and mild neutropenia.   At her last follow up on 01/20/2022 she was diagnosed with pneumonia and treated with Augmentin/Doxycycline. She was originally here for her anemia follow up however her CBC was more abnormal than usual- thought to be due to current infection. She presents today for recheck to ensure her CBC has returned to her baseline.   She reports that she is about 95% better from her recent pneumonia. No fevers or SOB but still has an occasional cough. She is still very active.   Overall, I would have to say that her performance status is ECOG 1.  Wt Readings from Last 3 Encounters:  02/03/22 116 lb 3.2 oz (52.7 kg)  01/20/22 113 lb 6.4 oz (51.4 kg)  10/14/21 121 lb 12.8 oz (55.2 kg)     Past Medical History:  Diagnosis Date   CKD (chronic kidney disease) stage 3, GFR 30-59 ml/min (HCC)    Hypertension    Osteoporosis   :   Past Surgical History:  Procedure Laterality Date   ABDOMINAL HYSTERECTOMY     ANUS SURGERY     BLADDER SURGERY     FEMUR IM NAIL Left 06/02/2019   Procedure: INTRAMEDULLARY (IM) NAIL FEMORAL;  Surgeon: Newt Minion, MD;  Location: [facility];  Service: Orthopedics;  Laterality: Left;  :   Current Outpatient Medications:    amLODipine (NORVASC) 5 MG tablet, Take 5 mg by mouth daily., Disp: , Rfl:    Cholecalciferol (VITAMIN D3) 50 MCG (2000 UT) TABS, Take 2,000 Units by mouth daily., Disp: , Rfl:    fenofibrate (TRICOR) 145 MG tablet, Take 145 mg by mouth daily., Disp: , Rfl:    fexofenadine (ALLEGRA) 180 MG tablet, Take 180 mg by mouth daily., Disp: , Rfl:    fluticasone (FLONASE) 50 MCG/ACT nasal spray, Place 1 spray into both  nostrils daily. , Disp: , Rfl:    Iron-FA-B Cmp-C-Biot-Probiotic (FUSION PLUS) CAPS, Take 1 tablet by mouth daily after breakfast., Disp: 30 capsule, Rfl: 12   losartan (COZAAR) 50 MG tablet, Take 50 mg by mouth 2 (two) times daily., Disp: , Rfl:    Magnesium 250 MG TABS, Take 250 mg by mouth at bedtime., Disp: , Rfl:    Multiple Vitamins-Minerals (PRESERVISION AREDS 2) CAPS, Take 1 capsule by mouth 2 (two) times daily., Disp: , Rfl:    pregabalin (LYRICA) 50 MG capsule, TAKE 1 CAPSULE(50 MG) BY MOUTH TWICE DAILY IN THE MORNING AND AT BEDTIME, Disp: 60 capsule, Rfl: 2   Probiotic Product (ALIGN) 4 MG CAPS, Take by mouth as needed., Disp: , Rfl:    sertraline (ZOLOFT) 50 MG tablet, Take 50 mg by mouth daily., Disp: , Rfl:    temazepam (RESTORIL) 15 MG capsule, Take 15 mg by mouth at bedtime., Disp: , Rfl:    acetaminophen (TYLENOL) 500 MG tablet, Take 1 tablet (500 mg total) by mouth every 6 (six) hours as needed for mild pain. (Patient not taking: Reported on 02/03/2022), Disp: 30 tablet, Rfl: 0:  :   Allergies  Allergen Reactions   Codeine Other (See Comments)    Abd pain   Erythromycin Other (See Comments)  Abd pain  :   Family History  Problem Relation Age of Onset   Heart attack Father    Diabetes Neg Hx    Hyperlipidemia Neg Hx    Hypertension Neg Hx   :   Social History   Socioeconomic History   Marital status: Married    Spouse name: Not on file   Number of children: Not on file   Years of education: Not on file   Highest education level: Not on file  Occupational History   Not on file  Tobacco Use   Smoking status: Never   Smokeless tobacco: Never  Vaping Use   Vaping Use: Never used  Substance and Sexual Activity   Alcohol use: No   Drug use: No   Sexual activity: Not on file  Other Topics Concern   Not on file  Social History Narrative   Not on file   Social Determinants of Health   Financial Resource Strain: Not on file  Food Insecurity: Not on  file  Transportation Needs: Not on file  Physical Activity: Not on file  Stress: Not on file  Social Connections: Not on file  Intimate Partner Violence: Not on file  :  Review of Systems  Constitutional:  Negative for chills, fever, malaise/fatigue and weight loss.  HENT: Negative.    Eyes: Negative.   Respiratory:  Positive for cough. Negative for shortness of breath and wheezing.   Cardiovascular: Negative.  Negative for chest pain and leg swelling.  Gastrointestinal: Negative.  Negative for blood in stool and melena.  Genitourinary: Negative.   Musculoskeletal: Negative.   Skin: Negative.  Negative for rash.  Neurological: Negative.   Endo/Heme/Allergies: Negative.   Psychiatric/Behavioral: Negative.     Vitals:   02/03/22 1254  BP: 126/87  Pulse: 66  Resp: 17  Temp: 98.3 F (36.8 C)  SpO2: 99%    Physical Exam Vitals reviewed.  HENT:     Head: Normocephalic and atraumatic.  Eyes:     Pupils: Pupils are equal, round, and reactive to light.  Cardiovascular:     Rate and Rhythm: Normal rate and regular rhythm.     Heart sounds: Normal heart sounds.  Pulmonary:     Effort: Pulmonary effort is normal. No respiratory distress.     Breath sounds: No wheezing, rhonchi or rales.  Abdominal:     General: Bowel sounds are normal.     Palpations: Abdomen is soft.  Musculoskeletal:        General: No tenderness or deformity. Normal range of motion.     Cervical back: Normal range of motion.  Lymphadenopathy:     Cervical: No cervical adenopathy.  Skin:    General: Skin is warm and dry.     Findings: No erythema or rash.  Neurological:     General: No focal deficit present.     Mental Status: She is alert and oriented to person, place, and time. Mental status is at baseline.  Psychiatric:        Behavior: Behavior normal.        Thought Content: Thought content normal.        Judgment: Judgment normal.     Recent Labs    02/03/22 1240  WBC 13.7*  HGB 11.5*   HCT 36.0  PLT 374   Encounter Diagnoses  Name Primary?   Abnormal CBC Yes   Productive cough    Fever, unspecified fever cause     Assessment and Plan: Ms. Julia Mora  is a very nice 72 year old white female.    Neutropenia: Improved but not fully resolved. She is clinically much improved from her pneumonia. I would like to see her back in 1 month to recheck labs and ensure that her WBC count has returned to normal. She will also have her follow up chest x ray performed after the 18th of this month to ensure clearance of her CAP. Reviewed red flags   Anemia: Chronic in nature and essentially stable. We will have her follow up in 1 month for further evaluation.   Hyponatremia: Found on labs. Pt attributes this to not drinking enough water and not eating enough salt. She will gently increase both. Discussed red flags. If not improved with these efforts we will investigate further.   Hughie Closs PA-C

## 2022-02-03 NOTE — Patient Instructions (Signed)
Chest x ray after the 18th of Feb Increase your salt and water intake slightly We will see you in 1 month to recheck labs.

## 2022-02-08 ENCOUNTER — Ambulatory Visit: Payer: Medicare Other | Attending: Neurosurgery | Admitting: Physical Therapy

## 2022-02-08 ENCOUNTER — Encounter: Payer: Self-pay | Admitting: Physical Therapy

## 2022-02-08 DIAGNOSIS — R2681 Unsteadiness on feet: Secondary | ICD-10-CM | POA: Insufficient documentation

## 2022-02-08 DIAGNOSIS — M5416 Radiculopathy, lumbar region: Secondary | ICD-10-CM | POA: Insufficient documentation

## 2022-02-08 DIAGNOSIS — R252 Cramp and spasm: Secondary | ICD-10-CM | POA: Insufficient documentation

## 2022-02-08 DIAGNOSIS — M25552 Pain in left hip: Secondary | ICD-10-CM | POA: Insufficient documentation

## 2022-02-08 DIAGNOSIS — M5432 Sciatica, left side: Secondary | ICD-10-CM | POA: Insufficient documentation

## 2022-02-08 DIAGNOSIS — M6281 Muscle weakness (generalized): Secondary | ICD-10-CM | POA: Insufficient documentation

## 2022-02-08 NOTE — Therapy (Signed)
OUTPATIENT PHYSICAL THERAPY TREATMENT  Patient Name: DUSTI TETRO MRN: [id number] DOB:03/28/1950, 72 y.o., female Today's Date: 02/08/2022  END OF SESSION:  PT End of Session - 02/08/22 1101     Visit Number 7    Number of Visits 16    Date for PT Re-Evaluation 03/02/22    Authorization Type BCBS Medicare    Progress Note Due on Visit 10    PT Start Time 1101    PT Stop Time 1145    PT Time Calculation (min) 44 min    Activity Tolerance Patient tolerated treatment well    Behavior During Therapy WFL for tasks assessed/performed               Past Medical History:  Diagnosis Date   CKD (chronic kidney disease) stage 3, GFR 30-59 ml/min (HCC)    Hypertension    Osteoporosis    Past Surgical History:  Procedure Laterality Date   ABDOMINAL HYSTERECTOMY     ANUS SURGERY     BLADDER SURGERY     FEMUR IM NAIL Left 06/02/2019   Procedure: INTRAMEDULLARY (IM) NAIL FEMORAL;  Surgeon: Newt Minion, MD;  Location: [facility];  Service: Orthopedics;  Laterality: Left;   Patient Active Problem List   Diagnosis Date Noted   Fall    Closed left femoral fracture ([place]) 06/01/2019   Essential hypertension 06/01/2019   CKD (chronic kidney disease) stage 3, GFR 30-59 ml/min ([place]) 06/01/2019   Left knee injury 08/21/2012   Right foot pain 07/24/2012    PCP: Loraine Leriche., MD  REFERRING PROVIDER: Loraine Leriche.,*  REFERRING DIAG: G57.02 (ICD-10-CM) - Piriformis syndrome of left side  THERAPY DIAG:  Pain in left hip  Muscle weakness (generalized)  Unsteadiness on feet  Sciatica, left side  Radiculopathy, lumbar region  Cramp and spasm  Rationale for Evaluation and Treatment: Rehabilitation  ONSET DATE: ~3 years  SUBJECTIVE:   SUBJECTIVE STATEMENT: Johanne Mcglade Rivers reports she had an injection in her L hip area for piriformis syndrome instead of her back yesterday, also said the doctor told her that about 15 different conditions could be causing  her pain and that it could take up to 6 weeks for injection to take effect    PERTINENT HISTORY: Had PT in the past for her back, history of scoliosis  PAIN:  Are you having pain? Yes: NPRS scale: 1/10 Pain location: L glute  PRECAUTIONS: None  WEIGHT BEARING RESTRICTIONS: No  FALLS:  Has patient fallen in last 6 months? No  LIVING ENVIRONMENT: Lives with: lives with their spouse Lives in: House/apartment Stairs: No issues with stairs Has following equipment at home: None  OCCUPATION: Retired Education officer, museum  PLOF: Independent  PATIENT GOALS: Improve pain/sleep; decrease having to use pain medicine  NEXT MD VISIT:  02/03/22  OBJECTIVE:   DIAGNOSTIC FINDINGS: Hip x-ray from 06/07/19: 1. Prior ORIF left femur. Hardware intact. Anatomic alignment. Upper left femoral shaft fracture again noted. Some degree of early callus formation noted.   2. Sclerotic densities in the left sacrum and left pubis. Although these may represent bone islands, blastic metastatic disease cannot be excluded. Whole-body bone scan can be obtained to further Evaluate  MRI lumbar spine 11/14/20: IMPRESSION: 1. Lumbar spine spondylosis as described above. 2. No acute osseous injury of the lumbar spine.   PATIENT SURVEYS:  LEFS 72/80 = 90%  COGNITION: Overall cognitive status: Within functional limits for tasks assessed     SENSATION: L lateral knee decreased  sensation vs R  EDEMA:  None  POSTURE: R iliac crest higher than L in standing; R lateral hip shift  MUSCLE LENGTH: Hamstrings: Right >90 deg; Left 80 deg Thomas test: Right 10 deg below plinth; Left 10 deg below plinth  PALPATION: Pt reports L>R TTP in piriformis, glutes, lumbar paraspinals, hamstrings, gastroc   LOWER EXTREMITY MMT:  MMT Right eval Left eval  Hip flexion 4+ 4+  Hip extension 3 without trunk compensation 3- without trunk compensation  Hip abduction 3+ 3+  Hip adduction    Hip internal rotation    Hip  external rotation    Knee flexion 5 4-  Knee extension 5 4+  Ankle dorsiflexion    Ankle plantarflexion    Ankle inversion    Ankle eversion     (Blank rows = not tested)  LOWER EXTREMITY SPECIAL TESTS:  Hip special tests: Saralyn Pilar (FABER) test: positive , Thomas test: negative, and Ely's test: negative can feel in front of thigh bilat Slump test: (+) L, SLR: Neg  FUNCTIONAL TESTS:  5 times sit to stand: 11 sec no UE support  GAIT: Distance walked: 100' Assistive device utilized: None Level of assistance: Complete Independence Comments: 02/08/22 - decreased heel strike on R, increased out toeing on L and decreased stance time and weight shift to LLE during gait, decreased gait speed   TODAY'S TREATMENT:                                                                                                                              DATE:  02/08/2022 Therapeutic Exercise: to improve strength and mobility.  Demo, verbal and tactile cues throughout for technique. Gait x 900' (850 completed in 6 min ) for warm-up - cues for heel strike.  Standing heel raies 2# x 20 with UE support Standing toe raises x 20  with UE support Standing hip extensions 2# 2 x 20 bil  with UE support Standing hip abduction 2# 2 x 20 bil  with UE support Standing hip flexion 2# 2 x 10 bil -with UE support noted more difficulty on Left Squats x 20 with chair behind Seated Piriformis stretches Seated hip flexors step overs x 20 bil  Forward T's with UE support x 10 bil    02/02/22 Therapeutic Exercise: to improve strength and mobility.  Demo, verbal and tactile cues throughout for technique. Nustep L5 x 6 min  Supine LTR  Supine hip IR/ER repeated stretches Supine PPT x 10 Spine bridges x 10  -with cues for TrA bracing S/L clams - progressed to GTB x 10  Manual Therapy: to decrease muscle spasm and pain and improve mobility STM/TPR to L piriformis/glut med, L QL, UPA mobs L lumbar spine, skilled palpation and  monitoring during dry needling. Trigger Point Dry-Needling  Treatment instructions: Expect mild to moderate muscle soreness. S/S of pneumothorax if dry needled over a lung field, and to seek immediate medical attention should they occur.  Patient verbalized understanding of these instructions and education. Patient Consent Given: Yes Education handout provided: Yes Muscles treated: L glut med, L piriformis Electrical stimulation performed: No Parameters: N/A Treatment response/outcome: Twitch Response Elicited and Palpable Increase in Muscle Length    01/31/22 Therapeutic Exercise: to improve strength and mobility.  Demo, verbal and tactile cues throughout for technique. Nustep L5x25min Seated piriformis stretch leg on table x 30 sec Supine hamstring stretch x 30 sec w/ strap Supine sciatic nerve glide x 10 Supine SLR x 10 bil Seated ER AROM x 10 bil Standing hip abduction x 10 bil 1# Standing hip extension x 10 bil 1# Standing marches x 10 bil 1#    PATIENT EDUCATION:  Education details: HEP update  Person educated: Patient Education method: Explanation, Demonstration, and Handouts Education comprehension: verbalized understanding, returned demonstration, and needs further education  HOME EXERCISE PROGRAM: Access Code: [email or web address] URL: [email or web address] Date: 02/08/2022 Prepared by: [place] with Counter Support  - 1 x daily - 7 x weekly - 3 sets - 10 reps - Heel Toe Raises with Counter Support  - 1 x daily - 7 x weekly - 3 sets - 10 reps - Standing Hip Abduction with Counter Support  - 1 x daily - 7 x weekly - 3 sets - 10 reps - Standing Hip Extension with Counter Support  - 1 x daily - 7 x weekly - 3 sets - 10 reps - Standing Hip Flexion with Counter Support  - 1 x daily - 7 x weekly - 3 sets - 10 reps - Squat with Chair Touch  - 1 x daily - 7 x weekly - 3 sets - 10 reps  ASSESSMENT:  CLINICAL IMPRESSION: Marge Vandermeulen Elsey  reports minimal pain in L hip today.  Noted decreased heel strike on R, increased out toeing on L and decreased stance time and weight shift to LLE during gait, and she also reported increased fatigue in L hip flexors compared to right.  Discussed how this muscle imbalance in gait could be affecting pain, updated HEP, recommended ankle weights and inexpensive options.   Denny Lave Silverstein continues to demonstrate potential for improvement and would benefit from continued skilled therapy to address impairments.    OBJECTIVE IMPAIRMENTS: decreased activity tolerance, decreased balance, difficulty walking, decreased ROM, decreased strength, increased fascial restrictions, increased muscle spasms, postural dysfunction, and pain.   ACTIVITY LIMITATIONS: sleeping and locomotion level  PARTICIPATION LIMITATIONS: meal prep and community activity  PERSONAL FACTORS: Past/current experiences and Time since onset of injury/illness/exacerbation are also affecting patient's functional outcome.   REHAB POTENTIAL: Good  CLINICAL DECISION MAKING: Evolving/moderate complexity  EVALUATION COMPLEXITY: Moderate   GOALS: Goals reviewed with patient? Yes  SHORT TERM GOALS: Target date: 02/02/2022  Pt will be ind with initial HEP Baseline: Goal status: MET- 01/31/22  2.  PT will assess 5 x STS within the next 2 visits Baseline:  Goal status: MET- 01/19/22  3.  PT will assess DGI within the next 2 visits Baseline:  Goal status: MET-01/19/22   LONG TERM GOALS: Target date: 03/02/2022   Pt will be ind with advancing and progressing HEP Baseline:  Goal status: IN PROGRESS  2.  Pt will demo improved bilat hip strength to at least 4+/5 for improved transfers and mobility Baseline:  Goal status: IN PROGRESS  3.  Pt will report decrease in pain by >/=50% Baseline:  Goal status: IN PROGRESS  4.  Pt will  demo L = R SLR to demo improving neural tension and hamstring length Baseline:  Goal  status: IN PROGRESS  5.  Pt will have increased LEFS to >/= 95% Baseline:  Goal status: IN PROGRESS  PLAN:  PT FREQUENCY: 2x/week  PT DURATION: 8 weeks  PLANNED INTERVENTIONS: Therapeutic exercises, Therapeutic activity, Neuromuscular re-education, Balance training, Gait training, Patient/Family education, Self Care, Joint mobilization, Stair training, Aquatic Therapy, Dry Needling, Electrical stimulation, Spinal mobilization, Cryotherapy, Moist heat, Taping, Ionotophoresis 4mg /ml Dexamethasone, Manual therapy, and Re-evaluation  PLAN FOR NEXT SESSION:  Continue to hip/core/glute strengthening and hip mobility; incorporate dynamic, higher level balance; Manual work as needed.    , PT, DPT  02/08/2022, 11:53 AM

## 2022-02-11 ENCOUNTER — Ambulatory Visit: Payer: Medicare Other

## 2022-02-11 DIAGNOSIS — M5432 Sciatica, left side: Secondary | ICD-10-CM

## 2022-02-11 DIAGNOSIS — M25552 Pain in left hip: Secondary | ICD-10-CM | POA: Diagnosis not present

## 2022-02-11 DIAGNOSIS — R252 Cramp and spasm: Secondary | ICD-10-CM

## 2022-02-11 DIAGNOSIS — M6281 Muscle weakness (generalized): Secondary | ICD-10-CM

## 2022-02-11 DIAGNOSIS — M5416 Radiculopathy, lumbar region: Secondary | ICD-10-CM

## 2022-02-11 DIAGNOSIS — R2681 Unsteadiness on feet: Secondary | ICD-10-CM

## 2022-02-11 NOTE — Therapy (Signed)
OUTPATIENT PHYSICAL THERAPY TREATMENT  Patient Name: Julia Mora MRN: [id number] DOB:1950/10/02, 72 y.o., female Today's Date: 02/11/2022  END OF SESSION:  PT End of Session - 02/11/22 1157     Visit Number 8    Number of Visits 16    Date for PT Re-Evaluation 03/02/22    Authorization Type BCBS Medicare    Progress Note Due on Visit 10    PT Start Time 1106    PT Stop Time 1149    PT Time Calculation (min) 43 min    Activity Tolerance Patient tolerated treatment well    Behavior During Therapy WFL for tasks assessed/performed                Past Medical History:  Diagnosis Date   CKD (chronic kidney disease) stage 3, GFR 30-59 ml/min (HCC)    Hypertension    Osteoporosis    Past Surgical History:  Procedure Laterality Date   ABDOMINAL HYSTERECTOMY     ANUS SURGERY     BLADDER SURGERY     FEMUR IM NAIL Left 06/02/2019   Procedure: INTRAMEDULLARY (IM) NAIL FEMORAL;  Surgeon: Newt Minion, MD;  Location: [facility];  Service: Orthopedics;  Laterality: Left;   Patient Active Problem List   Diagnosis Date Noted   Fall    Closed left femoral fracture ([place]) 06/01/2019   Essential hypertension 06/01/2019   CKD (chronic kidney disease) stage 3, GFR 30-59 ml/min ([place]) 06/01/2019   Left knee injury 08/21/2012   Right foot pain 07/24/2012    PCP: Loraine Leriche., MD  REFERRING PROVIDER: Myrle Sheng, *  REFERRING DIAG: G57.02 (ICD-10-CM) - Piriformis syndrome of left side  THERAPY DIAG:  Pain in left hip  Muscle weakness (generalized)  Unsteadiness on feet  Sciatica, left side  Radiculopathy, lumbar region  Cramp and spasm  Rationale for Evaluation and Treatment: Rehabilitation  ONSET DATE: ~3 years  SUBJECTIVE:   SUBJECTIVE STATEMENT: Pt reports the injection may be kicking in now. Reports no pain in low back just discomfort in posterior L knee.  PERTINENT HISTORY: Had PT in the past for her back, history of scoliosis  PAIN:   Are you having pain? Yes: NPRS scale: 0/10 Pain location: N/A  PRECAUTIONS: None  WEIGHT BEARING RESTRICTIONS: No  FALLS:  Has patient fallen in last 6 months? No  LIVING ENVIRONMENT: Lives with: lives with their spouse Lives in: House/apartment Stairs: No issues with stairs Has following equipment at home: None  OCCUPATION: Retired Education officer, museum  PLOF: Independent  PATIENT GOALS: Improve pain/sleep; decrease having to use pain medicine  NEXT MD VISIT:  02/03/22  OBJECTIVE:   DIAGNOSTIC FINDINGS: Hip x-ray from 06/07/19: 1. Prior ORIF left femur. Hardware intact. Anatomic alignment. Upper left femoral shaft fracture again noted. Some degree of early callus formation noted.   2. Sclerotic densities in the left sacrum and left pubis. Although these may represent bone islands, blastic metastatic disease cannot be excluded. Whole-body bone scan can be obtained to further Evaluate  MRI lumbar spine 11/14/20: IMPRESSION: 1. Lumbar spine spondylosis as described above. 2. No acute osseous injury of the lumbar spine.   PATIENT SURVEYS:  LEFS 72/80 = 90%  COGNITION: Overall cognitive status: Within functional limits for tasks assessed     SENSATION: L lateral knee decreased sensation vs R  EDEMA:  None  POSTURE: R iliac crest higher than L in standing; R lateral hip shift  MUSCLE LENGTH: Hamstrings: Right >90 deg; Left 80 deg  Thomas test: Right 10 deg below plinth; Left 10 deg below plinth  PALPATION: Pt reports L>R TTP in piriformis, glutes, lumbar paraspinals, hamstrings, gastroc   LOWER EXTREMITY MMT:  MMT Right eval Left eval  Hip flexion 4+ 4+  Hip extension 3 without trunk compensation 3- without trunk compensation  Hip abduction 3+ 3+  Hip adduction    Hip internal rotation    Hip external rotation    Knee flexion 5 4-  Knee extension 5 4+  Ankle dorsiflexion    Ankle plantarflexion    Ankle inversion    Ankle eversion     (Blank rows = not  tested)  LOWER EXTREMITY SPECIAL TESTS:  Hip special tests: Saralyn Pilar (FABER) test: positive , Thomas test: negative, and Ely's test: negative can feel in front of thigh bilat Slump test: (+) L, SLR: Neg  FUNCTIONAL TESTS:  5 times sit to stand: 11 sec no UE support  GAIT: Distance walked: 100' Assistive device utilized: None Level of assistance: Complete Independence Comments: 02/08/22 - decreased heel strike on R, increased out toeing on L and decreased stance time and weight shift to LLE during gait, decreased gait speed   TODAY'S TREATMENT:                                                                                                                              DATE:   02/11/2022 Therapeutic Exercise: to improve strength and mobility.  Demo, verbal and tactile cues throughout for technique. Bike L3x48mn Seated figure 4 stretch 2x30 sec Seated hip up and overs 1# x 20 bil Single leg RDL x 10 bil Sidestep and monster walk RTB 2x253fStanding hip abduction x 10 RTB bil Standing hip extension x 10 RTB bil  02/08/2022 Therapeutic Exercise: to improve strength and mobility.  Demo, verbal and tactile cues throughout for technique. Gait x 900' (850 completed in 6 min ) for warm-up - cues for heel strike.  Standing heel raies 2# x 20 with UE support Standing toe raises x 20  with UE support Standing hip extensions 2# 2 x 20 bil  with UE support Standing hip abduction 2# 2 x 20 bil  with UE support Standing hip flexion 2# 2 x 10 bil -with UE support noted more difficulty on Left Squats x 20 with chair behind Seated Piriformis stretches Seated hip flexors step overs x 20 bil  Forward T's with UE support x 10 bil    02/02/22 Therapeutic Exercise: to improve strength and mobility.  Demo, verbal and tactile cues throughout for technique. Nustep L5 x 6 min  Supine LTR  Supine hip IR/ER repeated stretches Supine PPT x 10 Spine bridges x 10  -with cues for TrA bracing S/L clams -  progressed to GTB x 10  Manual Therapy: to decrease muscle spasm and pain and improve mobility STM/TPR to L piriformis/glut med, L QL, UPA mobs L lumbar spine, skilled palpation and monitoring during dry needling.  Trigger Point Dry-Needling  Treatment instructions: Expect mild to moderate muscle soreness. S/S of pneumothorax if dry needled over a lung field, and to seek immediate medical attention should they occur. Patient verbalized understanding of these instructions and education. Patient Consent Given: Yes Education handout provided: Yes Muscles treated: L glut med, L piriformis Electrical stimulation performed: No Parameters: N/A Treatment response/outcome: Twitch Response Elicited and Palpable Increase in Muscle Length    01/31/22 Therapeutic Exercise: to improve strength and mobility.  Demo, verbal and tactile cues throughout for technique. Nustep L5x17mn Seated piriformis stretch leg on table x 30 sec Supine hamstring stretch x 30 sec w/ strap Supine sciatic nerve glide x 10 Supine SLR x 10 bil Seated ER AROM x 10 bil Standing hip abduction x 10 bil 1# Standing hip extension x 10 bil 1# Standing marches x 10 bil 1#    PATIENT EDUCATION:  Education details: HEP update  Person educated: Patient Education method: Explanation, Demonstration, and Handouts Education comprehension: verbalized understanding, returned demonstration, and needs further education  HOME EXERCISE PROGRAM: Access Code: [email or web address]: [email or web address] Date: 02/08/2022 Prepared by: ELas Animaswith Counter Support  - 1 x daily - 7 x weekly - 3 sets - 10 reps - Heel Toe Raises with Counter Support  - 1 x daily - 7 x weekly - 3 sets - 10 reps - Standing Hip Abduction with Counter Support  - 1 x daily - 7 x weekly - 3 sets - 10 reps - Standing Hip Extension with Counter Support  - 1 x daily - 7 x weekly - 3 sets - 10 reps - Standing Hip Flexion  with Counter Support  - 1 x daily - 7 x weekly - 3 sets - 10 reps - Squat with Chair Touch  - 1 x daily - 7 x weekly - 3 sets - 10 reps  ASSESSMENT:  CLINICAL IMPRESSION: Advanced therex to improve hip strength necessary for improve gait and standing tolerance with ADLs. Postural cues given with standing hip exercises. Instruction to keep knees bent during monster walk and sidestep to target glutes. Pt would continue to benefit from skilled PT to improve function.  OBJECTIVE IMPAIRMENTS: decreased activity tolerance, decreased balance, difficulty walking, decreased ROM, decreased strength, increased fascial restrictions, increased muscle spasms, postural dysfunction, and pain.   ACTIVITY LIMITATIONS: sleeping and locomotion level  PARTICIPATION LIMITATIONS: meal prep and community activity  PERSONAL FACTORS: Past/current experiences and Time since onset of injury/illness/exacerbation are also affecting patient's functional outcome.   REHAB POTENTIAL: Good  CLINICAL DECISION MAKING: Evolving/moderate complexity  EVALUATION COMPLEXITY: Moderate   GOALS: Goals reviewed with patient? Yes  SHORT TERM GOALS: Target date: 02/02/2022  Pt will be ind with initial HEP Baseline: Goal status: MET- 01/31/22  2.  PT will assess 5 x STS within the next 2 visits Baseline:  Goal status: MET- 01/19/22  3.  PT will assess DGI within the next 2 visits Baseline:  Goal status: MET-01/19/22   LONG TERM GOALS: Target date: 03/02/2022   Pt will be ind with advancing and progressing HEP Baseline:  Goal status: IN PROGRESS  2.  Pt will demo improved bilat hip strength to at least 4+/5 for improved transfers and mobility Baseline:  Goal status: IN PROGRESS  3.  Pt will report decrease in pain by >/=50% Baseline:  Goal status: IN PROGRESS  4.  Pt will demo L = R SLR to demo improving neural tension and hamstring length  Baseline:  Goal status: IN PROGRESS  5.  Pt will have increased LEFS to  >/= 95% Baseline:  Goal status: IN PROGRESS  PLAN:  PT FREQUENCY: 2x/week  PT DURATION: 8 weeks  PLANNED INTERVENTIONS: Therapeutic exercises, Therapeutic activity, Neuromuscular re-education, Balance training, Gait training, Patient/Family education, Self Care, Joint mobilization, Stair training, Aquatic Therapy, Dry Needling, Electrical stimulation, Spinal mobilization, Cryotherapy, Moist heat, Taping, Ionotophoresis 33m/ml Dexamethasone, Manual therapy, and Re-evaluation  PLAN FOR NEXT SESSION:  Continue to hip/core/glute strengthening and hip mobility; incorporate dynamic, higher level balance; Manual work as needed.    BArtist Pais PTA 02/11/2022, 12:16 PM

## 2022-02-16 ENCOUNTER — Ambulatory Visit: Payer: Medicare Other

## 2022-02-16 DIAGNOSIS — M6281 Muscle weakness (generalized): Secondary | ICD-10-CM

## 2022-02-16 DIAGNOSIS — R2681 Unsteadiness on feet: Secondary | ICD-10-CM

## 2022-02-16 DIAGNOSIS — M25552 Pain in left hip: Secondary | ICD-10-CM

## 2022-02-16 DIAGNOSIS — M5432 Sciatica, left side: Secondary | ICD-10-CM

## 2022-02-16 DIAGNOSIS — R252 Cramp and spasm: Secondary | ICD-10-CM

## 2022-02-16 DIAGNOSIS — M5416 Radiculopathy, lumbar region: Secondary | ICD-10-CM

## 2022-02-16 NOTE — Therapy (Signed)
OUTPATIENT PHYSICAL THERAPY TREATMENT  Patient Name: Julia Mora MRN: [id number] DOB:[birth year], 72 y.o., female Today's Date: 02/16/2022  END OF SESSION:  PT End of Session - 02/16/22 1323     Visit Number 9    Number of Visits 16    Date for PT Re-Evaluation 03/02/22    Authorization Type BCBS Medicare    Progress Note Due on Visit 10    PT Start Time 1316    PT Stop Time 1400    PT Time Calculation (min) 44 min    Activity Tolerance Patient tolerated treatment well    Behavior During Therapy WFL for tasks assessed/performed                 Past Medical History:  Diagnosis Date   CKD (chronic kidney disease) stage 3, GFR 30-59 ml/min (HCC)    Hypertension    Osteoporosis    Past Surgical History:  Procedure Laterality Date   ABDOMINAL HYSTERECTOMY     ANUS SURGERY     BLADDER SURGERY     FEMUR IM NAIL Left 06/02/2019   Procedure: INTRAMEDULLARY (IM) NAIL FEMORAL;  Surgeon: Newt Minion, MD;  Location: [facility];  Service: Orthopedics;  Laterality: Left;   Patient Active Problem List   Diagnosis Date Noted   Fall    Closed left femoral fracture ([place]) 06/01/2019   Essential hypertension 06/01/2019   CKD (chronic kidney disease) stage 3, GFR 30-59 ml/min ([place]) 06/01/2019   Left knee injury 08/21/2012   Right foot pain 07/24/2012    PCP: Loraine Leriche., MD  REFERRING PROVIDER: Myrle Sheng, *  REFERRING DIAG: G57.02 (ICD-10-CM) - Piriformis syndrome of left side  THERAPY DIAG:  Pain in left hip  Muscle weakness (generalized)  Unsteadiness on feet  Sciatica, left side  Radiculopathy, lumbar region  Cramp and spasm  Rationale for Evaluation and Treatment: Rehabilitation  ONSET DATE: ~3 years  SUBJECTIVE:   SUBJECTIVE STATEMENT: Pt reports the injection helped for a few days but now has worn off. Has had some  increase in pain today in glutes and posterior thigh.  PERTINENT HISTORY: Had PT in the past for her back,  history of scoliosis  PAIN:  Are you having pain? Yes: NPRS scale: 5/10 Pain location: Glutes and posterior thigh  PRECAUTIONS: None  WEIGHT BEARING RESTRICTIONS: No  FALLS:  Has patient fallen in last 6 months? No  LIVING ENVIRONMENT: Lives with: lives with their spouse Lives in: House/apartment Stairs: No issues with stairs Has following equipment at home: None  OCCUPATION: Retired Education officer, museum  PLOF: Independent  PATIENT GOALS: Improve pain/sleep; decrease having to use pain medicine  NEXT MD VISIT:  02/03/22  OBJECTIVE:   DIAGNOSTIC FINDINGS: Hip x-ray from 06/07/19: 1. Prior ORIF left femur. Hardware intact. Anatomic alignment. Upper left femoral shaft fracture again noted. Some degree of early callus formation noted.   2. Sclerotic densities in the left sacrum and left pubis. Although these may represent bone islands, blastic metastatic disease cannot be excluded. Whole-body bone scan can be obtained to further Evaluate  MRI lumbar spine 11/14/20: IMPRESSION: 1. Lumbar spine spondylosis as described above. 2. No acute osseous injury of the lumbar spine.   PATIENT SURVEYS:  LEFS 72/80 = 90%  COGNITION: Overall cognitive status: Within functional limits for tasks assessed     SENSATION: L lateral knee decreased sensation vs R  EDEMA:  None  POSTURE: R iliac crest higher than L in standing; R lateral hip shift  MUSCLE LENGTH: Hamstrings: Right >90 deg; Left 80 deg Thomas test: Right 10 deg below plinth; Left 10 deg below plinth  PALPATION: Pt reports L>R TTP in piriformis, glutes, lumbar paraspinals, hamstrings, gastroc   LOWER EXTREMITY MMT:  MMT Right eval Left eval Right  Left   Hip flexion 4+ 4+ 4+ 4+  Hip extension 3 without trunk compensation 3- without trunk compensation 4- 4-  Hip abduction 3+ 3+ 4- 4  Hip adduction      Hip internal rotation      Hip external rotation      Knee flexion 5 4- 5 4  Knee extension 5 4+    Ankle  dorsiflexion      Ankle plantarflexion      Ankle inversion      Ankle eversion       (Blank rows = not tested)  LOWER EXTREMITY SPECIAL TESTS:  Hip special tests: Saralyn Pilar (FABER) test: positive , Thomas test: negative, and Ely's test: negative can feel in front of thigh bilat Slump test: (+) L, SLR: Neg  FUNCTIONAL TESTS:  5 times sit to stand: 11 sec no UE support  GAIT: Distance walked: 100' Assistive device utilized: None Level of assistance: Complete Independence Comments: 02/08/22 - decreased heel strike on R, increased out toeing on L and decreased stance time and weight shift to LLE during gait, decreased gait speed   TODAY'S TREATMENT:                                                                                                                              DATE:  02/16/22 Therapeutic Exercise: to improve strength and mobility.  Demo, verbal and tactile cues throughout for technique. Nustep L5x40mn LE MMT Seated piriformis stretch L 2x30 sec Seated LAQ 2# x 10 bil Seated marching 2# x 10 bil Standing hip abduction 2x10 2# bil Standing hip extension 2x10 2# bil Functional squat x 10  Single leg deadlift x 10   02/11/2022 Therapeutic Exercise: to improve strength and mobility.  Demo, verbal and tactile cues throughout for technique. Bike L3x628m Seated figure 4 stretch 2x30 sec Seated hip up and overs 1# x 20 bil Single leg RDL x 10 bil Sidestep and monster walk RTB 2x2564ftanding hip abduction x 10 RTB bil Standing hip extension x 10 RTB bil  02/08/2022 Therapeutic Exercise: to improve strength and mobility.  Demo, verbal and tactile cues throughout for technique. Gait x 900' (850 completed in 6 min ) for warm-up - cues for heel strike.  Standing heel raies 2# x 20 with UE support Standing toe raises x 20  with UE support Standing hip extensions 2# 2 x 20 bil  with UE support Standing hip abduction 2# 2 x 20 bil  with UE support Standing hip flexion 2# 2 x 10 bil  -with UE support noted more difficulty on Left Squats x 20 with chair behind Seated Piriformis stretches Seated hip flexors step overs  x 20 bil  Forward T's with UE support x 10 bil     PATIENT EDUCATION:  Education details: HEP update  Person educated: Patient Education method: Explanation, Demonstration, and Handouts Education comprehension: verbalized understanding, returned demonstration, and needs further education  HOME EXERCISE PROGRAM: Access Code: [email or web address] URL: [email or web address] Date: 02/08/2022 Prepared by: [place] with Counter Support  - 1 x daily - 7 x weekly - 3 sets - 10 reps - Heel Toe Raises with Counter Support  - 1 x daily - 7 x weekly - 3 sets - 10 reps - Standing Hip Abduction with Counter Support  - 1 x daily - 7 x weekly - 3 sets - 10 reps - Standing Hip Extension with Counter Support  - 1 x daily - 7 x weekly - 3 sets - 10 reps - Standing Hip Flexion with Counter Support  - 1 x daily - 7 x weekly - 3 sets - 10 reps - Squat with Chair Touch  - 1 x daily - 7 x weekly - 3 sets - 10 reps  ASSESSMENT:  CLINICAL IMPRESSION: Progressed exercises to continue focusing on hip strength based on MMT today. Most weakness continues to be in glutes. Gave GTB for HEP with standing hip abduction and extension. Pt showed more fatigue with functional movements such as deadlifts and squats. Needs 10th visit progress note next visit. Would continue to benefit from skilled therapy.   OBJECTIVE IMPAIRMENTS: decreased activity tolerance, decreased balance, difficulty walking, decreased ROM, decreased strength, increased fascial restrictions, increased muscle spasms, postural dysfunction, and pain.   ACTIVITY LIMITATIONS: sleeping and locomotion level  PARTICIPATION LIMITATIONS: meal prep and community activity  PERSONAL FACTORS: Past/current experiences and Time since onset of injury/illness/exacerbation are also  affecting patient's functional outcome.   REHAB POTENTIAL: Good  CLINICAL DECISION MAKING: Evolving/moderate complexity  EVALUATION COMPLEXITY: Moderate   GOALS: Goals reviewed with patient? Yes  SHORT TERM GOALS: Target date: 02/02/2022  Pt will be ind with initial HEP Baseline: Goal status: MET- 01/31/22  2.  PT will assess 5 x STS within the next 2 visits Baseline:  Goal status: MET- 01/19/22  3.  PT will assess DGI within the next 2 visits Baseline:  Goal status: MET-01/19/22   LONG TERM GOALS: Target date: 03/02/2022   Pt will be ind with advancing and progressing HEP Baseline:  Goal status: IN PROGRESS  2.  Pt will demo improved bilat hip strength to at least 4+/5 for improved transfers and mobility Baseline:  Goal status: IN PROGRESS  3.  Pt will report decrease in pain by >/=50% Baseline:  Goal status: IN PROGRESS  4.  Pt will demo L = R SLR to demo improving neural tension and hamstring length Baseline:  Goal status: IN PROGRESS  5.  Pt will have increased LEFS to >/= 95% Baseline:  Goal status: IN PROGRESS  PLAN:  PT FREQUENCY: 2x/week  PT DURATION: 8 weeks  PLANNED INTERVENTIONS: Therapeutic exercises, Therapeutic activity, Neuromuscular re-education, Balance training, Gait training, Patient/Family education, Self Care, Joint mobilization, Stair training, Aquatic Therapy, Dry Needling, Electrical stimulation, Spinal mobilization, Cryotherapy, Moist heat, Taping, Ionotophoresis 79m/ml Dexamethasone, Manual therapy, and Re-evaluation  PLAN FOR NEXT SESSION:  Continue to hip/core/glute strengthening and hip mobility; incorporate dynamic, higher level balance; Manual work as needed.    BArtist Pais PTA 02/16/2022, 2:02 PM

## 2022-02-17 ENCOUNTER — Ambulatory Visit: Payer: Medicare Other | Admitting: Physical Therapy

## 2022-02-17 ENCOUNTER — Encounter: Payer: Self-pay | Admitting: Physical Therapy

## 2022-02-17 DIAGNOSIS — M5432 Sciatica, left side: Secondary | ICD-10-CM

## 2022-02-17 DIAGNOSIS — R2681 Unsteadiness on feet: Secondary | ICD-10-CM

## 2022-02-17 DIAGNOSIS — M5416 Radiculopathy, lumbar region: Secondary | ICD-10-CM

## 2022-02-17 DIAGNOSIS — M25552 Pain in left hip: Secondary | ICD-10-CM | POA: Diagnosis not present

## 2022-02-17 DIAGNOSIS — R252 Cramp and spasm: Secondary | ICD-10-CM

## 2022-02-17 DIAGNOSIS — M6281 Muscle weakness (generalized): Secondary | ICD-10-CM

## 2022-02-17 NOTE — Therapy (Signed)
OUTPATIENT PHYSICAL THERAPY TREATMENT Progress Note Reporting Period 01/05/2022 to 02/17/2022  See note below for Objective Data and Assessment of Progress/Goals.     Patient Name: Julia Mora MRN: [id number] DOB:18-Jun-1950, 72 y.o., female Today's Date: 02/17/2022  END OF SESSION:  PT End of Session - 02/17/22 1315     Visit Number 10    Number of Visits 16    Date for PT Re-Evaluation 03/02/22    Authorization Type BCBS Medicare    Progress Note Due on Visit 10    PT Start Time 1315    PT Stop Time 1405    PT Time Calculation (min) 50 min    Activity Tolerance Patient tolerated treatment well    Behavior During Therapy WFL for tasks assessed/performed                 Past Medical History:  Diagnosis Date   CKD (chronic kidney disease) stage 3, GFR 30-59 ml/min (HCC)    Hypertension    Osteoporosis    Past Surgical History:  Procedure Laterality Date   ABDOMINAL HYSTERECTOMY     ANUS SURGERY     BLADDER SURGERY     FEMUR IM NAIL Left 06/02/2019   Procedure: INTRAMEDULLARY (IM) NAIL FEMORAL;  Surgeon: Newt Minion, MD;  Location: [facility];  Service: Orthopedics;  Laterality: Left;   Patient Active Problem List   Diagnosis Date Noted   Fall    Closed left femoral fracture ([place]) 06/01/2019   Essential hypertension 06/01/2019   CKD (chronic kidney disease) stage 3, GFR 30-59 ml/min ([place]) 06/01/2019   Left knee injury 08/21/2012   Right foot pain 07/24/2012    PCP: Loraine Leriche., MD  REFERRING PROVIDER: Myrle Sheng, *  REFERRING DIAG: G57.02 (ICD-10-CM) - Piriformis syndrome of left side  THERAPY DIAG:  Pain in left hip  Muscle weakness (generalized)  Unsteadiness on feet  Sciatica, left side  Radiculopathy, lumbar region  Cramp and spasm  Rationale for Evaluation and Treatment: Rehabilitation  ONSET DATE: ~3 years  SUBJECTIVE:   SUBJECTIVE STATEMENT: Back of leg from hip down to toes started to hurt yesterday, which  makes her feel out of balance because it feels numb.    PERTINENT HISTORY: Had PT in the past for her back, history of scoliosis  PAIN:  Are you having pain? Yes: NPRS scale: 6-7/10 Pain location: Glutes and posterior thigh  PRECAUTIONS: None  WEIGHT BEARING RESTRICTIONS: No  FALLS:  Has patient fallen in last 6 months? No  LIVING ENVIRONMENT: Lives with: lives with their spouse Lives in: House/apartment Stairs: No issues with stairs Has following equipment at home: None  OCCUPATION: Retired Education officer, museum  PLOF: Independent  PATIENT GOALS: Improve pain/sleep; decrease having to use pain medicine  NEXT MD VISIT:  02/03/22  OBJECTIVE:   DIAGNOSTIC FINDINGS: Hip x-ray from 06/07/19: 1. Prior ORIF left femur. Hardware intact. Anatomic alignment. Upper left femoral shaft fracture again noted. Some degree of early callus formation noted.   2. Sclerotic densities in the left sacrum and left pubis. Although these may represent bone islands, blastic metastatic disease cannot be excluded. Whole-body bone scan can be obtained to further Evaluate  MRI lumbar spine 11/14/20: IMPRESSION: 1. Lumbar spine spondylosis as described above. 2. No acute osseous injury of the lumbar spine.   PATIENT SURVEYS:  LEFS 72/80 = 90%  COGNITION: Overall cognitive status: Within functional limits for tasks assessed     SENSATION: L lateral knee decreased sensation vs  R  EDEMA:  None  POSTURE: R iliac crest higher than L in standing; R lateral hip shift  MUSCLE LENGTH: Hamstrings: Right >90 deg; Left 80 deg Thomas test: Right 10 deg below plinth; Left 10 deg below plinth  PALPATION: Pt reports L>R TTP in piriformis, glutes, lumbar paraspinals, hamstrings, gastroc   LOWER EXTREMITY MMT:  MMT Right eval Left eval Right 02/16/22  Left 02/16/22  Hip flexion 4+ 4+ 4+ 4+  Hip extension 3 without trunk compensation 3- without trunk compensation 4- 4-  Hip abduction 3+ 3+ 4- 4  Hip  adduction      Hip internal rotation      Hip external rotation      Knee flexion 5 4- 5 4  Knee extension 5 4+    Ankle dorsiflexion      Ankle plantarflexion      Ankle inversion      Ankle eversion       (Blank rows = not tested)  LOWER EXTREMITY SPECIAL TESTS:  Hip special tests: Saralyn Pilar (FABER) test: positive , Thomas test: negative, and Ely's test: negative can feel in front of thigh bilat Slump test: (+) L, SLR: Neg  FUNCTIONAL TESTS:  5 times sit to stand: 11 sec no UE support 02/17/2022 18/24  GAIT: Distance walked: 100' Assistive device utilized: None Level of assistance: Complete Independence Comments: 02/08/22 - decreased heel strike on R, increased out toeing on L and decreased stance time and weight shift to LLE during gait, decreased gait speed   [facility] PT Assessment - 02/17/22 0001       Standardized Balance Assessment   Standardized Balance Assessment Dynamic Gait Index      Dynamic Gait Index   Level Surface Mild Impairment    Change in Gait Speed Mild Impairment    Gait with Horizontal Head Turns Normal    Gait with Vertical Head Turns Moderate Impairment    Gait and Pivot Turn Mild Impairment    Step Over Obstacle Normal    Step Around Obstacles Normal    Steps Mild Impairment    Total Score 18              TODAY'S TREATMENT:                                                                                                                              DATE:   02/17/22 Therapeutic Exercise: to improve strength and mobility.  Demo, verbal and tactile cues throughout for technique. Bike x 6 min Prone laying x 2 min Prone press-ups x 10 Prone hamstring curls x 10 bil Prone hip extension x 10 bil  Therapeutic Activity:  to assess progress towards goals.  Review of goals DGI Manual Therapy: to decrease muscle spasm and pain and improve mobility IASTM with foam roller to L glutes, TPR to L lumbar paraspinals, L UPA mobs lumbar spine, skilled palpation  and monitoring during dry needling. Trigger Point  Dry-Needling  Treatment instructions: Expect mild to moderate muscle soreness. S/S of pneumothorax if dry needled over a lung field, and to seek immediate medical attention should they occur. Patient verbalized understanding of these instructions and education. Patient Consent Given: Yes Education handout provided: Previously provided Muscles treated: L lumbar multifidi L2-5 Electrical stimulation performed: No Parameters: N/A Treatment response/outcome: Twitch Response Elicited and Palpable Increase in Muscle Length Modalities: CP x 5 min to low back.    02/16/22 Therapeutic Exercise: to improve strength and mobility.  Demo, verbal and tactile cues throughout for technique. Nustep L5x60mn LE MMT Seated piriformis stretch L 2x30 sec Seated LAQ 2# x 10 bil Seated marching 2# x 10 bil Standing hip abduction 2x10 2# bil Standing hip extension 2x10 2# bil Functional squat x 10  Single leg deadlift x 10   02/11/2022 Therapeutic Exercise: to improve strength and mobility.  Demo, verbal and tactile cues throughout for technique. Bike L3x637m Seated figure 4 stretch 2x30 sec Seated hip up and overs 1# x 20 bil Single leg RDL x 10 bil Sidestep and monster walk RTB 2x2551ftanding hip abduction x 10 RTB bil Standing hip extension x 10 RTB bil  PATIENT EDUCATION:  Education details: HEP update  Person educated: Patient Education method: Explanation, Demonstration, and Handouts Education comprehension: verbalized understanding, returned demonstration, and needs further education  HOME EXERCISE PROGRAM: Access Code: [email or web address]: [email or web address] Date: 02/08/2022 Prepared by: EliNew Hopeth Counter Support  - 1 x daily - 7 x weekly - 3 sets - 10 reps - Heel Toe Raises with Counter Support  - 1 x daily - 7 x weekly - 3 sets - 10 reps - Standing Hip Abduction with Counter  Support  - 1 x daily - 7 x weekly - 3 sets - 10 reps - Standing Hip Extension with Counter Support  - 1 x daily - 7 x weekly - 3 sets - 10 reps - Standing Hip Flexion with Counter Support  - 1 x daily - 7 x weekly - 3 sets - 10 reps - Squat with Chair Touch  - 1 x daily - 7 x weekly - 3 sets - 10 reps  ASSESSMENT:  CLINICAL IMPRESSION: Julia Mora making good progress towards all goals, reporting 70% improvement in pain overall, but also reporting increased LBP and radicular symptoms today down LLE, impairing her balance.  Performed DGI, noted she slows significantly with looking up, and ambulates with very wide BOS, and score of 18/24 places her at increased risk of falls.  She demonstrated spasm in her L lumbar multifidi today, but responded well to manual therapy and extension based exercises.  Updated HEP for McKenzie based extension exercises with instructions to stop if radicular symptoms worsen.  Applied CP to low back which further decreased pain.   Julia Mora continues to demonstrate potential for improvement and would benefit from continued skilled therapy to address impairments.     OBJECTIVE IMPAIRMENTS: decreased activity tolerance, decreased balance, difficulty walking, decreased ROM, decreased strength, increased fascial restrictions, increased muscle spasms, postural dysfunction, and pain.   ACTIVITY LIMITATIONS: sleeping and locomotion level  PARTICIPATION LIMITATIONS: meal prep and community activity  PERSONAL FACTORS: Past/current experiences and Time since onset of injury/illness/exacerbation are also affecting patient's functional outcome.   REHAB POTENTIAL: Good  CLINICAL DECISION MAKING: Evolving/moderate complexity  EVALUATION COMPLEXITY: Moderate   GOALS: Goals reviewed with patient? Yes  SHORT TERM GOALS: Target date: 02/02/2022  Pt will be ind with initial HEP Baseline: Goal status: MET- 01/31/22  2.  PT will assess 5 x STS within the next 2  visits Baseline:  Goal status: MET- 01/19/22  3.  PT will assess DGI within the next 2 visits Baseline:  Goal status: MET-01/19/22   LONG TERM GOALS: Target date: 03/02/2022   Pt will be ind with advancing and progressing HEP Baseline:  Goal status: IN PROGRESS 02/16/22- met for current  2.  Pt will demo improved bilat hip strength to at least 4+/5 for improved transfers and mobility Baseline:  Goal status: IN PROGRESS  02/16/22- see objective.   3.  Pt will report decrease in pain by >/=50% Baseline:  Goal status: MET 02/17/22- significant improvement overall 70%, but still has intermittent flares of pain, interferes with sleep.   4.  Pt will demo L = R SLR to demo improving neural tension and hamstring length Baseline:  Goal status: MET 02/17/2022 - equal   5.  Pt will have increased LEFS to >/= 95% Baseline:  Goal status: IN PROGRESS  6.  Pt will have improved DGI to 22/24 to decrease fall risk.  Baseline:  18/24 Goal status: INITIAL  PLAN:  PT FREQUENCY: 2x/week  PT DURATION: 8 weeks  PLANNED INTERVENTIONS: Therapeutic exercises, Therapeutic activity, Neuromuscular re-education, Balance training, Gait training, Patient/Family education, Self Care, Joint mobilization, Stair training, Aquatic Therapy, Dry Needling, Electrical stimulation, Spinal mobilization, Cryotherapy, Moist heat, Taping, Ionotophoresis 53m/ml Dexamethasone, Manual therapy, and Re-evaluation  PLAN FOR NEXT SESSION:  Continue to hip/core/glute strengthening and hip mobility; incorporate dynamic, higher level balance; Manual work as needed.    ERennie Natter PT, DPT  02/17/2022, 4:17 PM

## 2022-02-21 ENCOUNTER — Ambulatory Visit: Payer: Medicare Other

## 2022-02-21 DIAGNOSIS — R2681 Unsteadiness on feet: Secondary | ICD-10-CM

## 2022-02-21 DIAGNOSIS — M25552 Pain in left hip: Secondary | ICD-10-CM | POA: Diagnosis not present

## 2022-02-21 DIAGNOSIS — M6281 Muscle weakness (generalized): Secondary | ICD-10-CM

## 2022-02-21 DIAGNOSIS — M5416 Radiculopathy, lumbar region: Secondary | ICD-10-CM

## 2022-02-21 DIAGNOSIS — M5432 Sciatica, left side: Secondary | ICD-10-CM

## 2022-02-21 DIAGNOSIS — R252 Cramp and spasm: Secondary | ICD-10-CM

## 2022-02-21 NOTE — Therapy (Signed)
OUTPATIENT PHYSICAL THERAPY TREATMENT     Patient Name: Julia Mora MRN: [id number] DOB:05/20/1950, 72 y.o., female Today's Date: 02/21/2022  END OF SESSION:  PT End of Session - 02/21/22 1151     Visit Number 11    Number of Visits 16    Date for PT Re-Evaluation 03/02/22    Authorization Type BCBS Medicare    Progress Note Due on Visit 10    PT Start Time 1102    PT Stop Time 1145    PT Time Calculation (min) 43 min    Activity Tolerance Patient tolerated treatment well    Behavior During Therapy WFL for tasks assessed/performed                 Past Medical History:  Diagnosis Date   CKD (chronic kidney disease) stage 3, GFR 30-59 ml/min (HCC)    Hypertension    Osteoporosis    Past Surgical History:  Procedure Laterality Date   ABDOMINAL HYSTERECTOMY     ANUS SURGERY     BLADDER SURGERY     FEMUR IM NAIL Left 06/02/2019   Procedure: INTRAMEDULLARY (IM) NAIL FEMORAL;  Surgeon: Newt Minion, MD;  Location: [place];  Service: Orthopedics;  Laterality: Left;   Patient Active Problem List   Diagnosis Date Noted   Fall    Closed left femoral fracture ([place]) 06/01/2019   Essential hypertension 06/01/2019   CKD (chronic kidney disease) stage 3, GFR 30-59 ml/min ([place]) 06/01/2019   Left knee injury 08/21/2012   Right foot pain 07/24/2012    PCP: Loraine Leriche., MD  REFERRING PROVIDER: Myrle Sheng, *  REFERRING DIAG: G57.02 (ICD-10-CM) - Piriformis syndrome of left side  THERAPY DIAG:  Pain in left hip  Muscle weakness (generalized)  Unsteadiness on feet  Sciatica, left side  Radiculopathy, lumbar region  Cramp and spasm  Rationale for Evaluation and Treatment: Rehabilitation  ONSET DATE: ~3 years  SUBJECTIVE:   SUBJECTIVE STATEMENT: Pt reports she is doing well today with no pain.  PERTINENT HISTORY: Had PT in the past for her back, history of scoliosis  PAIN:  Are you having pain? Yes: NPRS scale: 0/10 Pain  location: Glutes and posterior thigh  PRECAUTIONS: None  WEIGHT BEARING RESTRICTIONS: No  FALLS:  Has patient fallen in last 6 months? No  LIVING ENVIRONMENT: Lives with: lives with their spouse Lives in: House/apartment Stairs: No issues with stairs Has following equipment at home: None  OCCUPATION: Retired Education officer, museum  PLOF: Independent  PATIENT GOALS: Improve pain/sleep; decrease having to use pain medicine  NEXT MD VISIT:  02/03/22  OBJECTIVE:   DIAGNOSTIC FINDINGS: Hip x-ray from 06/07/19: 1. Prior ORIF left femur. Hardware intact. Anatomic alignment. Upper left femoral shaft fracture again noted. Some degree of early callus formation noted.   2. Sclerotic densities in the left sacrum and left pubis. Although these may represent bone islands, blastic metastatic disease cannot be excluded. Whole-body bone scan can be obtained to further Evaluate  MRI lumbar spine 11/14/20: IMPRESSION: 1. Lumbar spine spondylosis as described above. 2. No acute osseous injury of the lumbar spine.   PATIENT SURVEYS:  LEFS 72/80 = 90%  COGNITION: Overall cognitive status: Within functional limits for tasks assessed     SENSATION: L lateral knee decreased sensation vs R  EDEMA:  None  POSTURE: R iliac crest higher than L in standing; R lateral hip shift  MUSCLE LENGTH: Hamstrings: Right >90 deg; Left 80 deg Thomas test: Right 10  deg below plinth; Left 10 deg below plinth  PALPATION: Pt reports L>R TTP in piriformis, glutes, lumbar paraspinals, hamstrings, gastroc   LOWER EXTREMITY MMT:  MMT Right eval Left eval Right 02/16/22  Left 02/16/22  Hip flexion 4+ 4+ 4+ 4+  Hip extension 3 without trunk compensation 3- without trunk compensation 4- 4-  Hip abduction 3+ 3+ 4- 4  Hip adduction      Hip internal rotation      Hip external rotation      Knee flexion 5 4- 5 4  Knee extension 5 4+    Ankle dorsiflexion      Ankle plantarflexion      Ankle inversion       Ankle eversion       (Blank rows = not tested)  LOWER EXTREMITY SPECIAL TESTS:  Hip special tests: Saralyn Pilar (FABER) test: positive , Thomas test: negative, and Ely's test: negative can feel in front of thigh bilat Slump test: (+) L, SLR: Neg  FUNCTIONAL TESTS:  5 times sit to stand: 11 sec no UE support 02/17/2022 18/24  GAIT: Distance walked: 100' Assistive device utilized: None Level of assistance: Complete Independence Comments: 02/08/22 - decreased heel strike on R, increased out toeing on L and decreased stance time and weight shift to LLE during gait, decreased gait speed      TODAY'S TREATMENT:                                                                                                                              DATE:  02/21/22 Therapeutic Exercise: to improve strength and mobility.  Demo, verbal and tactile cues throughout for technique. Gait 4x157f for warm up Bridges with ball squeeze x 10; arms crossed for 2nd set x 10 Prone hip extension x 10 bil Quadruped hip extension x 10 bil STS with red TB at knees x 10 - mirror used Squat with red TB x 10- mirror used Step ups 6' x 10- fwd  Lateral step up LLE x 10  02/17/22 Therapeutic Exercise: to improve strength and mobility.  Demo, verbal and tactile cues throughout for technique. Bike x 6 min Prone laying x 2 min Prone press-ups x 10 Prone hamstring curls x 10 bil Prone hip extension x 10 bil  Therapeutic Activity:  to assess progress towards goals.  Review of goals DGI Manual Therapy: to decrease muscle spasm and pain and improve mobility IASTM with foam roller to L glutes, TPR to L lumbar paraspinals, L UPA mobs lumbar spine, skilled palpation and monitoring during dry needling. Trigger Point Dry-Needling  Treatment instructions: Expect mild to moderate muscle soreness. S/S of pneumothorax if dry needled over a lung field, and to seek immediate medical attention should they occur. Patient verbalized  understanding of these instructions and education. Patient Consent Given: Yes Education handout provided: Previously provided Muscles treated: L lumbar multifidi L2-5 Electrical stimulation performed: No Parameters: N/A Treatment response/outcome: Twitch Response Elicited and  Palpable Increase in Muscle Length Modalities: CP x 5 min to low back.    02/16/22 Therapeutic Exercise: to improve strength and mobility.  Demo, verbal and tactile cues throughout for technique. Nustep L5x53mn LE MMT Seated piriformis stretch L 2x30 sec Seated LAQ 2# x 10 bil Seated marching 2# x 10 bil Standing hip abduction 2x10 2# bil Standing hip extension 2x10 2# bil Functional squat x 10  Single leg deadlift x 10   02/11/2022 Therapeutic Exercise: to improve strength and mobility.  Demo, verbal and tactile cues throughout for technique. Bike L3x661m Seated figure 4 stretch 2x30 sec Seated hip up and overs 1# x 20 bil Single leg RDL x 10 bil Sidestep and monster walk RTB 2x2535ftanding hip abduction x 10 RTB bil Standing hip extension x 10 RTB bil  PATIENT EDUCATION:  Education details: HEP update  Person educated: Patient Education method: Explanation, Demonstration, and Handouts Education comprehension: verbalized understanding, returned demonstration, and needs further education  HOME EXERCISE PROGRAM: Access Code: [email or web address]: [email or web address] Date: 02/08/2022 Prepared by: EliCarmenth Counter Support  - 1 x daily - 7 x weekly - 3 sets - 10 reps - Heel Toe Raises with Counter Support  - 1 x daily - 7 x weekly - 3 sets - 10 reps - Standing Hip Abduction with Counter Support  - 1 x daily - 7 x weekly - 3 sets - 10 reps - Standing Hip Extension with Counter Support  - 1 x daily - 7 x weekly - 3 sets - 10 reps - Standing Hip Flexion with Counter Support  - 1 x daily - 7 x weekly - 3 sets - 10 reps - Squat with Chair Touch  - 1 x  daily - 7 x weekly - 3 sets - 10 reps  ASSESSMENT:  CLINICAL IMPRESSION: SusKattyaows good response to progression of exercises. In quadruped she needed cues for neutral spine and to avoid pelvic rotation with hip extension. Added red TB to squats and STS to avoid valgus collapse with R knee, mirror also utilized to even out weight shift. Pt after exercises noted some muscle fatigue but no increased pain. She would continue to benefit from skilled PT to address functional impairments.      OBJECTIVE IMPAIRMENTS: decreased activity tolerance, decreased balance, difficulty walking, decreased ROM, decreased strength, increased fascial restrictions, increased muscle spasms, postural dysfunction, and pain.   ACTIVITY LIMITATIONS: sleeping and locomotion level  PARTICIPATION LIMITATIONS: meal prep and community activity  PERSONAL FACTORS: Past/current experiences and Time since onset of injury/illness/exacerbation are also affecting patient's functional outcome.   REHAB POTENTIAL: Good  CLINICAL DECISION MAKING: Evolving/moderate complexity  EVALUATION COMPLEXITY: Moderate   GOALS: Goals reviewed with patient? Yes  SHORT TERM GOALS: Target date: 02/02/2022  Pt will be ind with initial HEP Baseline: Goal status: MET- 01/31/22  2.  PT will assess 5 x STS within the next 2 visits Baseline:  Goal status: MET- 01/19/22  3.  PT will assess DGI within the next 2 visits Baseline:  Goal status: MET-01/19/22   LONG TERM GOALS: Target date: 03/02/2022   Pt will be ind with advancing and progressing HEP Baseline:  Goal status: IN PROGRESS 02/16/22- met for current  2.  Pt will demo improved bilat hip strength to at least 4+/5 for improved transfers and mobility Baseline:  Goal status: IN PROGRESS  02/16/22- see objective.   3.  Pt will report decrease in pain  by >/=50% Baseline:  Goal status: MET 02/17/22- significant improvement overall 70%, but still has intermittent flares of pain,  interferes with sleep.   4.  Pt will demo L = R SLR to demo improving neural tension and hamstring length Baseline:  Goal status: MET 02/17/2022 - equal   5.  Pt will have increased LEFS to >/= 95% Baseline:  Goal status: IN PROGRESS  6.  Pt will have improved DGI to 22/24 to decrease fall risk.  Baseline:  18/24 Goal status: INITIAL  PLAN:  PT FREQUENCY: 2x/week  PT DURATION: 8 weeks  PLANNED INTERVENTIONS: Therapeutic exercises, Therapeutic activity, Neuromuscular re-education, Balance training, Gait training, Patient/Family education, Self Care, Joint mobilization, Stair training, Aquatic Therapy, Dry Needling, Electrical stimulation, Spinal mobilization, Cryotherapy, Moist heat, Taping, Ionotophoresis 58m/ml Dexamethasone, Manual therapy, and Re-evaluation  PLAN FOR NEXT SESSION:  Continue to hip/core/glute strengthening and hip mobility; incorporate dynamic, higher level balance; Manual work as needed.    BArtist Pais PTA, DPT  02/21/2022, 12:05 PM

## 2022-02-24 ENCOUNTER — Encounter: Payer: Self-pay | Admitting: Physical Therapy

## 2022-02-24 ENCOUNTER — Ambulatory Visit: Payer: Medicare Other | Admitting: Physical Therapy

## 2022-02-24 DIAGNOSIS — M6281 Muscle weakness (generalized): Secondary | ICD-10-CM

## 2022-02-24 DIAGNOSIS — M25552 Pain in left hip: Secondary | ICD-10-CM

## 2022-02-24 DIAGNOSIS — R2681 Unsteadiness on feet: Secondary | ICD-10-CM

## 2022-02-24 DIAGNOSIS — M5416 Radiculopathy, lumbar region: Secondary | ICD-10-CM

## 2022-02-24 DIAGNOSIS — M5432 Sciatica, left side: Secondary | ICD-10-CM

## 2022-02-24 DIAGNOSIS — R252 Cramp and spasm: Secondary | ICD-10-CM

## 2022-02-24 NOTE — Therapy (Signed)
OUTPATIENT PHYSICAL THERAPY TREATMENT     Patient Name: Julia Mora MRN: [id number] DOB:1950-05-21, 72 y.o., female Today's Date: 02/24/2022  END OF SESSION:  PT End of Session - 02/24/22 1106     Visit Number 12    Number of Visits 16    Date for PT Re-Evaluation 03/02/22    Authorization Type BCBS Medicare    Progress Note Due on Visit 10    PT Start Time 1106    PT Stop Time 1150    PT Time Calculation (min) 44 min    Activity Tolerance Patient tolerated treatment well    Behavior During Therapy WFL for tasks assessed/performed                 Past Medical History:  Diagnosis Date   CKD (chronic kidney disease) stage 3, GFR 30-59 ml/min (HCC)    Hypertension    Osteoporosis    Past Surgical History:  Procedure Laterality Date   ABDOMINAL HYSTERECTOMY     ANUS SURGERY     BLADDER SURGERY     FEMUR IM NAIL Left 06/02/2019   Procedure: INTRAMEDULLARY (IM) NAIL FEMORAL;  Surgeon: Newt Minion, MD;  Location: [place];  Service: Orthopedics;  Laterality: Left;   Patient Active Problem List   Diagnosis Date Noted   Fall    Closed left femoral fracture ([place]) 06/01/2019   Essential hypertension 06/01/2019   CKD (chronic kidney disease) stage 3, GFR 30-59 ml/min ([place]) 06/01/2019   Left knee injury 08/21/2012   Right foot pain 07/24/2012    PCP: Loraine Leriche., MD  REFERRING PROVIDER: Myrle Sheng, *  REFERRING DIAG: G57.02 (ICD-10-CM) - Piriformis syndrome of left side  THERAPY DIAG:  Pain in left hip  Muscle weakness (generalized)  Unsteadiness on feet  Sciatica, left side  Radiculopathy, lumbar region  Cramp and spasm  Rationale for Evaluation and Treatment: Rehabilitation  ONSET DATE: ~3 years  SUBJECTIVE:   SUBJECTIVE STATEMENT:   During the day she feels good, but still has it get "angry" at night, wakes her up, not sure what to do.   PERTINENT HISTORY: Had PT in the past for her back, history of  scoliosis  PAIN:  Are you having pain? Yes: NPRS scale: 0/10 Pain location: Glutes and posterior thigh  PRECAUTIONS: None  WEIGHT BEARING RESTRICTIONS: No  FALLS:  Has patient fallen in last 6 months? No  LIVING ENVIRONMENT: Lives with: lives with their spouse Lives in: House/apartment Stairs: No issues with stairs Has following equipment at home: None  OCCUPATION: Retired Education officer, museum  PLOF: Independent  PATIENT GOALS: Improve pain/sleep; decrease having to use pain medicine  NEXT MD VISIT:  02/03/22  OBJECTIVE:   DIAGNOSTIC FINDINGS: Hip x-ray from 06/07/19: 1. Prior ORIF left femur. Hardware intact. Anatomic alignment. Upper left femoral shaft fracture again noted. Some degree of early callus formation noted.   2. Sclerotic densities in the left sacrum and left pubis. Although these may represent bone islands, blastic metastatic disease cannot be excluded. Whole-body bone scan can be obtained to further Evaluate  MRI lumbar spine 11/14/20: IMPRESSION: 1. Lumbar spine spondylosis as described above. 2. No acute osseous injury of the lumbar spine.   PATIENT SURVEYS:  LEFS 72/80 = 90%  COGNITION: Overall cognitive status: Within functional limits for tasks assessed     SENSATION: L lateral knee decreased sensation vs R  EDEMA:  None  POSTURE: R iliac crest higher than L in standing; R lateral hip  shift  MUSCLE LENGTH: Hamstrings: Right >90 deg; Left 80 deg Thomas test: Right 10 deg below plinth; Left 10 deg below plinth  PALPATION: Pt reports L>R TTP in piriformis, glutes, lumbar paraspinals, hamstrings, gastroc   LOWER EXTREMITY MMT:  MMT Right eval Left eval Right 02/16/22  Left 02/16/22  Hip flexion 4+ 4+ 4+ 4+  Hip extension 3 without trunk compensation 3- without trunk compensation 4- 4-  Hip abduction 3+ 3+ 4- 4  Hip adduction      Hip internal rotation      Hip external rotation      Knee flexion 5 4- 5 4  Knee extension 5 4+     Ankle dorsiflexion      Ankle plantarflexion      Ankle inversion      Ankle eversion       (Blank rows = not tested)  LOWER EXTREMITY SPECIAL TESTS:  Hip special tests: Saralyn Pilar (FABER) test: positive , Thomas test: negative, and Ely's test: negative can feel in front of thigh bilat Slump test: (+) L, SLR: Neg  FUNCTIONAL TESTS:  5 times sit to stand: 11 sec no UE support 02/17/2022 18/24  GAIT: Distance walked: 100' Assistive device utilized: None Level of assistance: Complete Independence Comments: 02/08/22 - decreased heel strike on R, increased out toeing on L and decreased stance time and weight shift to LLE during gait, decreased gait speed      TODAY'S TREATMENT:                                                                                                                              DATE:  02/24/22 Therapeutic Exercise: to improve strength and mobility.  Demo, verbal and tactile cues throughout for technique. Nustep L5 x 6 min  Quadruped hip extension 2 x 10 bil Self Care: Education on sleep positioning, trialing different positions and pillow set-up.   Manual Therapy: to decrease muscle spasm and pain and improve mobility IASTM with foam roller to L glutes, L UPA mobs lumbar spine, TPR to L piriformis, skilled palpation and monitoring during dry needling. Trigger Point Dry-Needling  Treatment instructions: Expect mild to moderate muscle soreness. S/S of pneumothorax if dry needled over a lung field, and to seek immediate medical attention should they occur. Patient verbalized understanding of these instructions and education. Patient Consent Given: Yes Education handout provided: Previously provided Muscles treated: L piriformis Electrical stimulation performed: No Parameters: N/A Treatment response/outcome: Twitch Response Elicited and Palpable Increase in Muscle Length  02/21/22 Therapeutic Exercise: to improve strength and mobility.  Demo, verbal and tactile cues  throughout for technique. Gait [id number] for warm up Bridges with ball squeeze x 10; arms crossed for 2nd set x 10 Prone hip extension x 10 bil Quadruped hip extension x 10 bil STS with red TB at knees x 10 - mirror used Squat with red TB x 10- mirror used Step ups 6' x 10- fwd  Lateral  step up LLE x 10  02/17/22 Therapeutic Exercise: to improve strength and mobility.  Demo, verbal and tactile cues throughout for technique. Bike x 6 min Prone laying x 2 min Prone press-ups x 10 Prone hamstring curls x 10 bil Prone hip extension x 10 bil  Therapeutic Activity:  to assess progress towards goals.  Review of goals DGI Manual Therapy: to decrease muscle spasm and pain and improve mobility IASTM with foam roller to L glutes, TPR to L lumbar paraspinals, L UPA mobs lumbar spine, skilled palpation and monitoring during dry needling. Trigger Point Dry-Needling  Treatment instructions: Expect mild to moderate muscle soreness. S/S of pneumothorax if dry needled over a lung field, and to seek immediate medical attention should they occur. Patient verbalized understanding of these instructions and education. Patient Consent Given: Yes Education handout provided: Previously provided Muscles treated: L lumbar multifidi L2-5 Electrical stimulation performed: No Parameters: N/A Treatment response/outcome: Twitch Response Elicited and Palpable Increase in Muscle Length Modalities: CP x 5 min to low back.   PATIENT EDUCATION:  Education details: sleep positioning  Person educated: Patient Education method: Explanation, Demonstration, and Handouts Education comprehension: verbalized understanding, returned demonstration, and needs further education  HOME EXERCISE PROGRAM: Access Code: [email or web address] URL: [email or web address] Date: 02/08/2022 Prepared by: Nikolski with Counter Support  - 1 x daily - 7 x weekly - 3 sets - 10 reps - Heel Toe  Raises with Counter Support  - 1 x daily - 7 x weekly - 3 sets - 10 reps - Standing Hip Abduction with Counter Support  - 1 x daily - 7 x weekly - 3 sets - 10 reps - Standing Hip Extension with Counter Support  - 1 x daily - 7 x weekly - 3 sets - 10 reps - Standing Hip Flexion with Counter Support  - 1 x daily - 7 x weekly - 3 sets - 10 reps - Squat with Chair Touch  - 1 x daily - 7 x weekly - 3 sets - 10 reps  ASSESSMENT:  CLINICAL IMPRESSION: Julia Mora reports significant improvement in pain, but still having significant sleep disruption due to pain at night.  We discussed sleep positioning and tried several positions with different pillow arrangements, recommended starting with pillow between knees.  She still had tightness in L piriformis, so performed TrDN to this muscle with good response.  Julia Mora continues to demonstrate potential for improvement and would benefit from continued skilled therapy to address impairments.        OBJECTIVE IMPAIRMENTS: decreased activity tolerance, decreased balance, difficulty walking, decreased ROM, decreased strength, increased fascial restrictions, increased muscle spasms, postural dysfunction, and pain.   ACTIVITY LIMITATIONS: sleeping and locomotion level  PARTICIPATION LIMITATIONS: meal prep and community activity  PERSONAL FACTORS: Past/current experiences and Time since onset of injury/illness/exacerbation are also affecting patient's functional outcome.   REHAB POTENTIAL: Good  CLINICAL DECISION MAKING: Evolving/moderate complexity  EVALUATION COMPLEXITY: Moderate   GOALS: Goals reviewed with patient? Yes  SHORT TERM GOALS: Target date: 02/02/2022  Pt will be ind with initial HEP Baseline: Goal status: MET- 01/31/22  2.  PT will assess 5 x STS within the next 2 visits Baseline:  Goal status: MET- 01/19/22  3.  PT will assess DGI within the next 2 visits Baseline:  Goal status: MET-01/19/22   LONG TERM GOALS:  Target date: 03/02/2022   Pt will be ind with advancing and progressing HEP Baseline:  Goal status: IN PROGRESS 02/16/22- met for current  2.  Pt will demo improved bilat hip strength to at least 4+/5 for improved transfers and mobility Baseline:  Goal status: IN PROGRESS  02/16/22- see objective.   3.  Pt will report decrease in pain by >/=50% Baseline:  Goal status: MET 02/17/22- significant improvement overall 70%, but still has intermittent flares of pain, interferes with sleep.   4.  Pt will demo L = R SLR to demo improving neural tension and hamstring length Baseline:  Goal status: MET 02/17/2022 - equal   5.  Pt will have increased LEFS to >/= 95% Baseline:  Goal status: IN PROGRESS  6.  Pt will have improved DGI to 22/24 to decrease fall risk.  Baseline:  18/24 Goal status: IN PROGRESS  PLAN:  PT FREQUENCY: 2x/week  PT DURATION: 8 weeks  PLANNED INTERVENTIONS: Therapeutic exercises, Therapeutic activity, Neuromuscular re-education, Balance training, Gait training, Patient/Family education, Self Care, Joint mobilization, Stair training, Aquatic Therapy, Dry Needling, Electrical stimulation, Spinal mobilization, Cryotherapy, Moist heat, Taping, Ionotophoresis 81m/ml Dexamethasone, Manual therapy, and Re-evaluation  PLAN FOR NEXT SESSION:  Continue to hip/core/glute strengthening and hip mobility; incorporate dynamic, higher level balance; Manual work as needed.    ERennie Natter PT, DPT  02/24/2022, 11:57 AM

## 2022-03-02 ENCOUNTER — Ambulatory Visit: Payer: Medicare Other

## 2022-03-03 ENCOUNTER — Inpatient Hospital Stay: Payer: Medicare Other | Admitting: Medical Oncology

## 2022-03-03 ENCOUNTER — Inpatient Hospital Stay: Payer: Medicare Other

## 2022-03-07 ENCOUNTER — Other Ambulatory Visit: Payer: Self-pay | Admitting: Physical Medicine and Rehabilitation

## 2022-03-10 ENCOUNTER — Encounter: Payer: Self-pay | Admitting: Medical Oncology

## 2022-03-10 ENCOUNTER — Other Ambulatory Visit: Payer: Self-pay

## 2022-03-10 ENCOUNTER — Inpatient Hospital Stay (HOSPITAL_BASED_OUTPATIENT_CLINIC_OR_DEPARTMENT_OTHER): Payer: Medicare Other | Admitting: Medical Oncology

## 2022-03-10 ENCOUNTER — Inpatient Hospital Stay: Payer: Medicare Other | Attending: Hematology & Oncology

## 2022-03-10 VITALS — BP 142/83 | HR 65 | Temp 98.1°F | Resp 18 | Ht 59.0 in | Wt 116.0 lb

## 2022-03-10 DIAGNOSIS — N183 Chronic kidney disease, stage 3 unspecified: Secondary | ICD-10-CM | POA: Diagnosis not present

## 2022-03-10 DIAGNOSIS — E871 Hypo-osmolality and hyponatremia: Secondary | ICD-10-CM | POA: Diagnosis not present

## 2022-03-10 DIAGNOSIS — D72818 Other decreased white blood cell count: Secondary | ICD-10-CM | POA: Insufficient documentation

## 2022-03-10 DIAGNOSIS — D649 Anemia, unspecified: Secondary | ICD-10-CM | POA: Diagnosis present

## 2022-03-10 DIAGNOSIS — R7989 Other specified abnormal findings of blood chemistry: Secondary | ICD-10-CM

## 2022-03-10 DIAGNOSIS — Z8701 Personal history of pneumonia (recurrent): Secondary | ICD-10-CM | POA: Insufficient documentation

## 2022-03-10 DIAGNOSIS — N189 Chronic kidney disease, unspecified: Secondary | ICD-10-CM | POA: Insufficient documentation

## 2022-03-10 DIAGNOSIS — Z8616 Personal history of COVID-19: Secondary | ICD-10-CM | POA: Insufficient documentation

## 2022-03-10 LAB — FERRITIN: Ferritin: 95 ng/mL (ref 11–307)

## 2022-03-10 LAB — CMP (CANCER CENTER ONLY)
ALT: 10 U/L (ref 0–44)
AST: 24 U/L (ref 15–41)
Albumin: 4.7 g/dL (ref 3.5–5.0)
Alkaline Phosphatase: 50 U/L (ref 38–126)
Anion gap: 8 (ref 5–15)
BUN: 27 mg/dL — ABNORMAL HIGH (ref 8–23)
CO2: 28 mmol/L (ref 22–32)
Calcium: 10.6 mg/dL — ABNORMAL HIGH (ref 8.9–10.3)
Chloride: 92 mmol/L — ABNORMAL LOW (ref 98–111)
Creatinine: 1.24 mg/dL — ABNORMAL HIGH (ref 0.44–1.00)
GFR, Estimated: 47 mL/min — ABNORMAL LOW (ref >60)
Glucose, Bld: 99 mg/dL (ref 70–99)
Potassium: 5 mmol/L (ref 3.5–5.1)
Sodium: 128 mmol/L — ABNORMAL LOW (ref 135–145)
Total Bilirubin: 0.5 mg/dL (ref 0.3–1.2)
Total Protein: 8.7 g/dL — ABNORMAL HIGH (ref 6.5–8.1)

## 2022-03-10 LAB — CBC WITH DIFFERENTIAL/PLATELET
Abs Immature Granulocytes: 0.08 10*3/uL — ABNORMAL HIGH (ref 0.00–0.07)
Basophils Absolute: 0 10*3/uL (ref 0.0–0.1)
Basophils Relative: 1 %
Eosinophils Absolute: 0.2 10*3/uL (ref 0.0–0.5)
Eosinophils Relative: 3 %
HCT: 40.3 % (ref 36.0–46.0)
Hemoglobin: 13 g/dL (ref 12.0–15.0)
Immature Granulocytes: 2 %
Lymphocytes Relative: 13 %
Lymphs Abs: 0.6 10*3/uL — ABNORMAL LOW (ref 0.7–4.0)
MCH: 27.3 pg (ref 26.0–34.0)
MCHC: 32.3 g/dL (ref 30.0–36.0)
MCV: 84.5 fL (ref 80.0–100.0)
Monocytes Absolute: 0.4 10*3/uL (ref 0.1–1.0)
Monocytes Relative: 9 %
Neutro Abs: 3.5 10*3/uL (ref 1.7–7.7)
Neutrophils Relative %: 72 %
Platelets: 426 10*3/uL — ABNORMAL HIGH (ref 150–400)
RBC: 4.77 MIL/uL (ref 3.87–5.11)
RDW: 15.8 % — ABNORMAL HIGH (ref 11.5–15.5)
WBC: 4.8 10*3/uL (ref 4.0–10.5)
nRBC: 0 % (ref 0.0–0.2)

## 2022-03-10 LAB — LACTATE DEHYDROGENASE: LDH: 162 U/L (ref 98–192)

## 2022-03-10 LAB — IRON AND IRON BINDING CAPACITY (CC-WL,HP ONLY)
Iron: 68 ug/dL (ref 28–170)
Saturation Ratios: 14 % (ref 10.4–31.8)
TIBC: 490 ug/dL — ABNORMAL HIGH (ref 250–450)
UIBC: 422 ug/dL (ref 148–442)

## 2022-03-10 NOTE — Progress Notes (Signed)
Referral MD  Reason for Referral: Transient leukopenia and anemia; chronic renal insufficiency  Chief Complaint  Patient presents with   Follow-up  My white blood cells are low and I have anemia.  HPI: Julia Mora is a very charming 72 year old white female.  She presents today for a follow up for her mild anemia and mild neutropenia.   At her last follow up on 01/20/2022 she was diagnosed with pneumonia and treated with Augmentin/Doxycycline. Today she reports that she is recently getting over a COVID-19 infection. No other illnesses. Still having some nausea, fatigue, diarrhea from the COVID-19 but overall improving.   No SOB, fever, unintentional weight loss.   Overall, I would have to say that her performance status is ECOG 1.  Wt Readings from Last 3 Encounters:  03/10/22 116 lb (52.6 kg)  02/03/22 116 lb 3.2 oz (52.7 kg)  01/20/22 113 lb 6.4 oz (51.4 kg)     Past Medical History:  Diagnosis Date   CKD (chronic kidney disease) stage 3, GFR 30-59 ml/min (HCC)    Hypertension    Osteoporosis   :   Past Surgical History:  Procedure Laterality Date   ABDOMINAL HYSTERECTOMY     ANUS SURGERY     BLADDER SURGERY     FEMUR IM NAIL Left 06/02/2019   Procedure: INTRAMEDULLARY (IM) NAIL FEMORAL;  Surgeon: Newt Minion, MD;  Location: [place];  Service: Orthopedics;  Laterality: Left;  :   Current Outpatient Medications:    acetaminophen (TYLENOL) 500 MG tablet, Take 1 tablet (500 mg total) by mouth every 6 (six) hours as needed for mild pain., Disp: 30 tablet, Rfl: 0   amLODipine (NORVASC) 5 MG tablet, Take 5 mg by mouth daily., Disp: , Rfl:    Cholecalciferol (VITAMIN D3) 50 MCG (2000 UT) TABS, Take 2,000 Units by mouth daily., Disp: , Rfl:    fenofibrate (TRICOR) 145 MG tablet, Take 145 mg by mouth daily., Disp: , Rfl:    fexofenadine (ALLEGRA) 180 MG tablet, Take 180 mg by mouth daily., Disp: , Rfl:    fluticasone (FLONASE) 50 MCG/ACT nasal spray, Place 1 spray into both  nostrils daily. , Disp: , Rfl:    Iron-FA-B Cmp-C-Biot-Probiotic (FUSION PLUS) CAPS, Take 1 tablet by mouth daily after breakfast., Disp: 30 capsule, Rfl: 12   losartan (COZAAR) 50 MG tablet, Take 50 mg by mouth 2 (two) times daily., Disp: , Rfl:    Magnesium 250 MG TABS, Take 250 mg by mouth at bedtime., Disp: , Rfl:    Multiple Vitamins-Minerals (PRESERVISION AREDS 2) CAPS, Take 1 capsule by mouth 2 (two) times daily., Disp: , Rfl:    pregabalin (LYRICA) 50 MG capsule, TAKE 1 CAPSULE(50 MG) BY MOUTH TWICE DAILY IN THE MORNING AND AT BEDTIME, Disp: 180 capsule, Rfl: 2   Probiotic Product (ALIGN) 4 MG CAPS, Take by mouth as needed., Disp: , Rfl:    sertraline (ZOLOFT) 50 MG tablet, Take 50 mg by mouth daily., Disp: , Rfl: :  :   Allergies  Allergen Reactions   Codeine Other (See Comments)    Abd pain   Erythromycin Other (See Comments)    Abd pain  :   Family History  Problem Relation Age of Onset   Heart attack Father    Diabetes Neg Hx    Hyperlipidemia Neg Hx    Hypertension Neg Hx   :   Social History   Socioeconomic History   Marital status: Married    Spouse name:  Not on file   Number of children: Not on file   Years of education: Not on file   Highest education level: Not on file  Occupational History   Not on file  Tobacco Use   Smoking status: Never   Smokeless tobacco: Never  Vaping Use   Vaping Use: Never used  Substance and Sexual Activity   Alcohol use: No   Drug use: No   Sexual activity: Not on file  Other Topics Concern   Not on file  Social History Narrative   Not on file   Social Determinants of Health   Financial Resource Strain: Not on file  Food Insecurity: Not on file  Transportation Needs: Not on file  Physical Activity: Not on file  Stress: Not on file  Social Connections: Not on file  Intimate Partner Violence: Not on file  :  Review of Systems  Constitutional:  Negative for chills, fever, malaise/fatigue and weight loss.   HENT: Negative.    Eyes: Negative.   Respiratory:  Positive for cough. Negative for shortness of breath and wheezing.   Cardiovascular: Negative.  Negative for chest pain and leg swelling.  Gastrointestinal: Negative.  Negative for blood in stool and melena.  Genitourinary: Negative.   Musculoskeletal: Negative.   Skin: Negative.  Negative for rash.  Neurological: Negative.   Endo/Heme/Allergies: Negative.   Psychiatric/Behavioral: Negative.     Vitals:   03/10/22 1124  BP: (!) 142/83  Pulse: 65  Resp: 18  Temp: 98.1 F (36.7 C)  SpO2: 100%     Physical Exam Vitals and nursing note reviewed.  Constitutional:      General: She is not in acute distress.    Appearance: Normal appearance. She is not ill-appearing, toxic-appearing or diaphoretic.  HENT:     Head: Normocephalic and atraumatic.  Eyes:     Pupils: Pupils are equal, round, and reactive to light.  Cardiovascular:     Rate and Rhythm: Normal rate and regular rhythm.     Heart sounds: Normal heart sounds.  Pulmonary:     Effort: Pulmonary effort is normal. No respiratory distress.     Breath sounds: No wheezing, rhonchi or rales.  Abdominal:     Palpations: Abdomen is soft.  Musculoskeletal:        General: No tenderness or deformity. Normal range of motion.     Cervical back: Normal range of motion.  Lymphadenopathy:     Cervical: No cervical adenopathy.  Skin:    General: Skin is warm and dry.     Findings: No erythema or rash.  Neurological:     General: No focal deficit present.     Mental Status: She is alert and oriented to person, place, and time. Mental status is at baseline.  Psychiatric:        Behavior: Behavior normal.        Thought Content: Thought content normal.        Judgment: Judgment normal.     Recent Labs    03/10/22 1103  WBC 4.8  HGB 13.0  HCT 40.3  PLT 426*   Encounter Diagnosis  Name Primary?   Abnormal CBC Yes    Assessment and Plan: Julia Mora is a very nice  72 year old white female.    Neutropenia: Resolved. Does have recent viral illness with somewhat recent bacterial infection. We will need to watch to see how she does over the next few months in terms of infections.   Anemia: Improved. We will  continue to monitor for changes but at the moment this is non-concerning.   Hyponatremia: Acute on chronic. Likely worsened by her diarrhea from COVID-19. She would benefit from Imodium, increased salt intake and hydration. I have encouraged her to continue her follow up in regards to her COVID-19 symptoms with her PCP who is managing this illness.   Hypercalcemia: Occurs off/on. Will monitor. I suspect she would benefit from nephrology consult. I will suggest that she discuss this with her PCP.   Hughie Closs PA-C

## 2022-03-15 NOTE — Therapy (Signed)
OUTPATIENT PHYSICAL THERAPY TREATMENT     Patient Name: Julia Mora MRN: [id number] DOB:[birth year], 72 y.o., female Today's Date: 03/16/2022  END OF SESSION:  PT End of Session - 03/16/22 1301     Visit Number 13    Number of Visits 21    Date for PT Re-Evaluation 04/13/22    Authorization Type BCBS Medicare    Progress Note Due on Visit 23    PT Start Time 1302    PT Stop Time 1346    PT Time Calculation (min) 44 min    Activity Tolerance Patient tolerated treatment well    Behavior During Therapy WFL for tasks assessed/performed                 Past Medical History:  Diagnosis Date   CKD (chronic kidney disease) stage 3, GFR 30-59 ml/min (HCC)    Hypertension    Osteoporosis    Past Surgical History:  Procedure Laterality Date   ABDOMINAL HYSTERECTOMY     ANUS SURGERY     BLADDER SURGERY     FEMUR IM NAIL Left 06/02/2019   Procedure: INTRAMEDULLARY (IM) NAIL FEMORAL;  Surgeon: Newt Minion, MD;  Location: [place];  Service: Orthopedics;  Laterality: Left;   Patient Active Problem List   Diagnosis Date Noted   Fall    Closed left femoral fracture ([place]) 06/01/2019   Essential hypertension 06/01/2019   CKD (chronic kidney disease) stage 3, GFR 30-59 ml/min ([place]) 06/01/2019   Left knee injury 08/21/2012   Right foot pain 07/24/2012    PCP: Loraine Leriche., MD  REFERRING PROVIDER: Myrle Sheng, *  REFERRING DIAG: G57.02 (ICD-10-CM) - Piriformis syndrome of left side  THERAPY DIAG:  Unsteadiness on feet  Pain in left hip  Muscle weakness (generalized)  Acute pain of right knee  Rationale for Evaluation and Treatment: Rehabilitation  ONSET DATE: ~3 years  SUBJECTIVE:   SUBJECTIVE STATEMENT: Patient has missed 3 weeks of PT due to pneumonia and Covid and feels like she hasn't progressed as she should have. I cannot walk on unlevel ground. I get tripped up. She denies falls. Denies hip pain. Her R knee has been hurting for 3  weeks but it is getter today. I tried to get in bed the wrong way. Kneeled onto knee and it strained it.  PERTINENT HISTORY: Had PT in the past for her back, history of scoliosis  PAIN:  Are you having pain? Yes: NPRS scale: 5/10 Pain location: R knee  PRECAUTIONS: None  WEIGHT BEARING RESTRICTIONS: No  FALLS:  Has patient fallen in last 6 months? No  LIVING ENVIRONMENT: Lives with: lives with their spouse Lives in: House/apartment Stairs: No issues with stairs Has following equipment at home: None  OCCUPATION: Retired Education officer, museum  PLOF: Independent  PATIENT GOALS: Improve pain/sleep; decrease having to use pain medicine  NEXT MD VISIT:  02/03/22  OBJECTIVE:   DIAGNOSTIC FINDINGS: Hip x-ray from 06/07/19: 1. Prior ORIF left femur. Hardware intact. Anatomic alignment. Upper left femoral shaft fracture again noted. Some degree of early callus formation noted.   2. Sclerotic densities in the left sacrum and left pubis. Although these may represent bone islands, blastic metastatic disease cannot be excluded. Whole-body bone scan can be obtained to further Evaluate  MRI lumbar spine 11/14/20: IMPRESSION: 1. Lumbar spine spondylosis as described above. 2. No acute osseous injury of the lumbar spine.   PATIENT SURVEYS:  LEFS 72/80 = 90%  60 / 80 =  75.0 % on 03/16/22  COGNITION: Overall cognitive status: Within functional limits for tasks assessed     SENSATION: L lateral knee decreased sensation vs R  EDEMA:  None  POSTURE: R iliac crest higher than L in standing; R lateral hip shift  MUSCLE LENGTH: Hamstrings: Right >90 deg; Left 80 deg Thomas test: Right 10 deg below plinth; Left 10 deg below plinth  PALPATION: Pt reports L>R TTP in piriformis, glutes, lumbar paraspinals, hamstrings, gastroc 03/16/22 TTP of R lateral joint line of knee   LOWER EXTREMITY MMT:  MMT Right eval Left eval Right 02/16/22  Left 02/16/22 Right 03/16/22 Left 03/16/22  Hip  flexion 4+ 4+ 4+ 4+ 4+ 4+  Hip extension 3 without trunk compensation 3- without trunk compensation 4- 4- 4- 5  Hip abduction 3+ 3+ 4- '4 4 5  '$ Hip adduction        Hip internal rotation        Hip external rotation        Knee flexion 5 4- '5 4 5 5  '$ Knee extension 5 4+   5 5  Ankle dorsiflexion     5 5  Ankle plantarflexion        Ankle inversion        Ankle eversion         (Blank rows = not tested)  LOWER EXTREMITY SPECIAL TESTS:  Hip special tests: Saralyn Pilar (FABER) test: positive , Thomas test: negative, and Ely's test: negative can feel in front of thigh bilat Slump test: (+) L, SLR: Neg  FUNCTIONAL TESTS:  5 times sit to stand: 11 sec no UE support DGI 02/17/2022 18/24, 03/16/22 18/24  GAIT: Distance walked: 100' Assistive device utilized: None Level of assistance: Complete Independence Comments: 02/08/22 - decreased heel strike on R, increased out toeing on L and decreased stance time and weight shift to LLE during gait, decreased gait speed 03/16/22 - decreased arm swing and trunk rotation, toeing out bil, decreased gait speed and step length   [facility] PT Assessment - 03/16/22 0001       Dynamic Gait Index   Level Surface Mild Impairment    Change in Gait Speed Mild Impairment    Gait with Horizontal Head Turns Mild Impairment    Gait with Vertical Head Turns Mild Impairment    Gait and Pivot Turn Mild Impairment    Step Over Obstacle Normal    Step Around Obstacles Normal    Steps Mild Impairment    Total Score 18               TODAY'S TREATMENT:                                                                                                                              DATE:  03/16/22 Therapeutic Exercise: to improve strength and mobility.  Demo, verbal and tactile cues throughout for technique. Nustep L5 x 6 min  S/L clam R  with GTB x 10 - requires PT to stabilize hip from rolling bwd Hooklying unilateral clam R GTB x 10  Therapeutic Activities: MMT, goals  assessed, DGI completed    02/24/22 Therapeutic Exercise: to improve strength and mobility.  Demo, verbal and tactile cues throughout for technique. Nustep L5 x 6 min  Quadruped hip extension 2 x 10 bil Self Care: Education on sleep positioning, trialing different positions and pillow set-up.   Manual Therapy: to decrease muscle spasm and pain and improve mobility IASTM with foam roller to L glutes, L UPA mobs lumbar spine, TPR to L piriformis, skilled palpation and monitoring during dry needling. Trigger Point Dry-Needling  Treatment instructions: Expect mild to moderate muscle soreness. S/S of pneumothorax if dry needled over a lung field, and to seek immediate medical attention should they occur. Patient verbalized understanding of these instructions and education. Patient Consent Given: Yes Education handout provided: Previously provided Muscles treated: L piriformis Electrical stimulation performed: No Parameters: N/A Treatment response/outcome: Twitch Response Elicited and Palpable Increase in Muscle Length  02/21/22 Therapeutic Exercise: to improve strength and mobility.  Demo, verbal and tactile cues throughout for technique. Gait 4x133f for warm up Bridges with ball squeeze x 10; arms crossed for 2nd set x 10 Prone hip extension x 10 bil Quadruped hip extension x 10 bil STS with red TB at knees x 10 - mirror used Squat with red TB x 10- mirror used Step ups 6' x 10- fwd  Lateral step up LLE x 10   PATIENT EDUCATION:  Education details: sleep positioning  Person educated: Patient Education method: Explanation, Demonstration, and Handouts Education comprehension: verbalized understanding, returned demonstration, and needs further education  HOME EXERCISE PROGRAM: Access Code: [email or web address]: [email or web address] Date: 03/16/2022 Prepared by: JAlmyra Free Exercises - Seated Table Piriformis Stretch  - 1 x daily - 7 x weekly - 3 sets - 30 sec hold - Supine  Hamstring Stretch with Strap  - 1 x daily - 7 x weekly - 2 sets - 30 sec hold - Supine Sciatic Nerve Glide  - 1 x daily - 7 x weekly - 1 sets - 10 reps - Supine Bridge  - 1 x daily - 7 x weekly - 2 sets - 10 reps - Clam with Resistance  - 1 x daily - 7 x weekly - 2 sets - 10 reps - Supine Lower Trunk Rotation  - 1 x daily - 7 x weekly - 1 sets - 10 reps - Supine Straight Leg Raises  - 1 x daily - 7 x weekly - 2 sets - 10 reps - Supine Posterior Pelvic Tilt  - 1 x daily - 7 x weekly - 3 sets - 10 reps - Heel Raises with Counter Support  - 1 x daily - 7 x weekly - 3 sets - 10 reps - Heel Toe Raises with Counter Support  - 1 x daily - 7 x weekly - 3 sets - 10 reps - Standing Hip Abduction with Counter Support  - 1 x daily - 7 x weekly - 3 sets - 10 reps - Standing Hip Extension with Counter Support  - 1 x daily - 7 x weekly - 3 sets - 10 reps - Standing Hip Flexion with Counter Support  - 1 x daily - 7 x weekly - 3 sets - 10 reps - Squat with Chair Touch  - 1 x daily - 7 x weekly - 3 sets - 10 reps - Prone Press Up On Elbows  -  1 x daily - 7 x weekly - 3 sets - 10 reps - Prone Hip Extension  - 1 x daily - 7 x weekly - 3 sets - 10 reps - Hooklying Isometric Clamshell  - 1 x daily - 7 x weekly - 3 sets - 10 reps  ASSESSMENT:  CLINICAL IMPRESSION: Julia Mora returns to PT after 3 week absence due to illness. Her POC has expired so she was reassessed. She reports that she no longer has R hip pain, but she continues to have weakness and balance issues, especially on uneven ground. Additionally she has new R knee pain which she hurt 3 weeks ago kneeling to get into bed. The pain has improved, but is still present. She has normal R knee ROM and strength, however she had marked tenderness in the lateral joint line on arrival. After TE and assessment she no longer had pain to the touch, however she did complain of pain with certain exercises. Julia Mora shows no change in her DGI 18/24 which still indicates  she is a significant risk for falls. She has functional R hip gluteus medius weakness with gait and sit to stand, although MMT tests 5/5, Overall her R hip strength has improved compared to the left which is still weak in flex, ABD and ext. Her LEFS has decreased from 90% to 75%. Julia Mora will benefit from skilled PT to address these deficits. Based on these deficits, I recommend that Julia Mora continue PT for 2x/wk for 4 more weeks. Julia Mora has a large HEP and would benefit from streamlining and focusing HEP on remaining defiicts.       OBJECTIVE IMPAIRMENTS: decreased activity tolerance, decreased balance, difficulty walking, decreased ROM, decreased strength, increased fascial restrictions, increased muscle spasms, postural dysfunction, and pain.   ACTIVITY LIMITATIONS: sleeping and locomotion level  PARTICIPATION LIMITATIONS: meal prep and community activity  PERSONAL FACTORS: Past/current experiences and Time since onset of injury/illness/exacerbation are also affecting patient's functional outcome.   REHAB POTENTIAL: Good  CLINICAL DECISION MAKING: Evolving/moderate complexity  EVALUATION COMPLEXITY: Moderate   GOALS: Goals reviewed with patient? Yes  SHORT TERM GOALS: Target date: 02/02/2022  Pt will be ind with initial HEP Baseline: Goal status: MET- 01/31/22  2.  PT will assess 5 x STS within the next 2 visits Baseline:  Goal status: MET- 01/19/22  3.  PT will assess DGI within the next 2 visits Baseline:  Goal status: MET-01/19/22   LONG TERM GOALS: Target date: 03/02/2022 extended to 04/13/22   Pt will be ind with advancing and progressing HEP Baseline:  Goal status: IN PROGRESS 02/16/22- met for current  2.  Pt will demo improved bilat hip strength to at least 4+/5 for improved transfers and mobility Baseline:  Goal status: IN PROGRESS  02/16/22- see objective.   3.  Pt will report decrease in pain by >/=50% Baseline:  Goal status: MET 02/17/22- significant improvement  overall 70%, but still has intermittent flares of pain, interferes with sleep.   4.  Pt will demo L = R SLR to demo improving neural tension and hamstring length Baseline:  Goal status: MET 02/17/2022 - equal   5.  Pt will have increased LEFS to >/= 95% Baseline:  Goal status: IN PROGRESS  6.  Pt will have improved DGI to 22/24 to decrease fall risk.  Baseline:  18/24 Goal status: IN PROGRESS  PLAN:  PT FREQUENCY: 2x/week  PT DURATION: 8 weeks  PLANNED INTERVENTIONS: Therapeutic exercises, Therapeutic activity, Neuromuscular re-education, Balance training, Gait  training, Patient/Family education, Self Care, Joint mobilization, Stair training, Aquatic Therapy, Dry Needling, Electrical stimulation, Spinal mobilization, Cryotherapy, Moist heat, Taping, Ionotophoresis '4mg'$ /ml Dexamethasone, Manual therapy, and Re-evaluation  PLAN FOR NEXT SESSION:  Continue to hip/core/glute med strengthening and hip mobility; incorporate dynamic, higher level balance including gait on uneven ground; Manual work as needed.    Quetzalli Clos, PT  03/16/2022, 3:58 PM

## 2022-03-16 ENCOUNTER — Encounter: Payer: Self-pay | Admitting: Physical Therapy

## 2022-03-16 ENCOUNTER — Ambulatory Visit: Payer: Medicare Other | Attending: Neurosurgery | Admitting: Physical Therapy

## 2022-03-16 DIAGNOSIS — M25552 Pain in left hip: Secondary | ICD-10-CM | POA: Insufficient documentation

## 2022-03-16 DIAGNOSIS — M6281 Muscle weakness (generalized): Secondary | ICD-10-CM | POA: Diagnosis present

## 2022-03-16 DIAGNOSIS — R2681 Unsteadiness on feet: Secondary | ICD-10-CM | POA: Diagnosis not present

## 2022-03-16 DIAGNOSIS — M25561 Pain in right knee: Secondary | ICD-10-CM | POA: Insufficient documentation

## 2022-03-18 ENCOUNTER — Ambulatory Visit: Payer: Medicare Other

## 2022-03-18 DIAGNOSIS — R2681 Unsteadiness on feet: Secondary | ICD-10-CM

## 2022-03-18 DIAGNOSIS — M6281 Muscle weakness (generalized): Secondary | ICD-10-CM

## 2022-03-18 DIAGNOSIS — M25552 Pain in left hip: Secondary | ICD-10-CM

## 2022-03-18 DIAGNOSIS — M25561 Pain in right knee: Secondary | ICD-10-CM

## 2022-03-18 NOTE — Therapy (Signed)
OUTPATIENT PHYSICAL THERAPY TREATMENT     Patient Name: Julia Mora MRN: [id number] DOB:[birth year], 72 y.o., female Today's Date: 03/18/2022  END OF SESSION:  PT End of Session - 03/18/22 1200     Visit Number 14    Number of Visits 21    Date for PT Re-Evaluation 04/13/22    Authorization Type BCBS Medicare    Progress Note Due on Visit 23    PT Start Time 1103    PT Stop Time 1148    PT Time Calculation (min) 45 min    Activity Tolerance Patient tolerated treatment well    Behavior During Therapy WFL for tasks assessed/performed                 Past Medical History:  Diagnosis Date   CKD (chronic kidney disease) stage 3, GFR 30-59 ml/min (HCC)    Hypertension    Osteoporosis    Past Surgical History:  Procedure Laterality Date   ABDOMINAL HYSTERECTOMY     ANUS SURGERY     BLADDER SURGERY     FEMUR IM NAIL Left 06/02/2019   Procedure: INTRAMEDULLARY (IM) NAIL FEMORAL;  Surgeon: Newt Minion, MD;  Location: [facility];  Service: Orthopedics;  Laterality: Left;   Patient Active Problem List   Diagnosis Date Noted   Fall    Closed left femoral fracture ([place]) 06/01/2019   Essential hypertension 06/01/2019   CKD (chronic kidney disease) stage 3, GFR 30-59 ml/min ([place]) 06/01/2019   Left knee injury 08/21/2012   Right foot pain 07/24/2012    PCP: Loraine Leriche., MD  REFERRING PROVIDER: Loraine Leriche.,*  REFERRING DIAG: G57.02 (ICD-10-CM) - Piriformis syndrome of left side  THERAPY DIAG:  Pain in left hip  Muscle weakness (generalized)  Unsteadiness on feet  Acute pain of right knee  Rationale for Evaluation and Treatment: Rehabilitation  ONSET DATE: ~3 years  SUBJECTIVE:   SUBJECTIVE STATEMENT: Having R knee pain today.   PERTINENT HISTORY: Had PT in the past for her back, history of scoliosis  PAIN:  Are you having pain? Yes: NPRS scale: 5/10 Pain location: R knee  PRECAUTIONS: None  WEIGHT BEARING RESTRICTIONS:  No  FALLS:  Has patient fallen in last 6 months? No  LIVING ENVIRONMENT: Lives with: lives with their spouse Lives in: House/apartment Stairs: No issues with stairs Has following equipment at home: None  OCCUPATION: Retired Education officer, museum  PLOF: Independent  PATIENT GOALS: Improve pain/sleep; decrease having to use pain medicine  NEXT MD VISIT:  02/03/22  OBJECTIVE:   DIAGNOSTIC FINDINGS: Hip x-ray from 06/07/19: 1. Prior ORIF left femur. Hardware intact. Anatomic alignment. Upper left femoral shaft fracture again noted. Some degree of early callus formation noted.   2. Sclerotic densities in the left sacrum and left pubis. Although these may represent bone islands, blastic metastatic disease cannot be excluded. Whole-body bone scan can be obtained to further Evaluate  MRI lumbar spine 11/14/20: IMPRESSION: 1. Lumbar spine spondylosis as described above. 2. No acute osseous injury of the lumbar spine.   PATIENT SURVEYS:  LEFS 72/80 = 90%  60 / 80 = 75.0 % on 03/16/22  COGNITION: Overall cognitive status: Within functional limits for tasks assessed     SENSATION: L lateral knee decreased sensation vs R  EDEMA:  None  POSTURE: R iliac crest higher than L in standing; R lateral hip shift  MUSCLE LENGTH: Hamstrings: Right >90 deg; Left 80 deg Thomas test: Right 10 deg below plinth; Left  10 deg below plinth  PALPATION: Pt reports L>R TTP in piriformis, glutes, lumbar paraspinals, hamstrings, gastroc 03/16/22 TTP of R lateral joint line of knee   LOWER EXTREMITY MMT:  MMT Right eval Left eval Right 02/16/22  Left 02/16/22 Right 03/16/22 Left 03/16/22  Hip flexion 4+ 4+ 4+ 4+ 4+ 4+  Hip extension 3 without trunk compensation 3- without trunk compensation 4- 4- 4- 5  Hip abduction 3+ 3+ 4- 4 4 5   Hip adduction        Hip internal rotation        Hip external rotation        Knee flexion 5 4- 5 4 5 5   Knee extension 5 4+   5 5  Ankle dorsiflexion     5 5   Ankle plantarflexion        Ankle inversion        Ankle eversion         (Blank rows = not tested)  LOWER EXTREMITY SPECIAL TESTS:  Hip special tests: Saralyn Pilar (FABER) test: positive , Thomas test: negative, and Ely's test: negative can feel in front of thigh bilat Slump test: (+) L, SLR: Neg  FUNCTIONAL TESTS:  5 times sit to stand: 11 sec no UE support DGI 02/17/2022 18/24, 03/16/22 18/24  GAIT: Distance walked: 100' Assistive device utilized: None Level of assistance: Complete Independence Comments: 02/08/22 - decreased heel strike on R, increased out toeing on L and decreased stance time and weight shift to LLE during gait, decreased gait speed 03/16/22 - decreased arm swing and trunk rotation, toeing out bil, decreased gait speed and step length       TODAY'S TREATMENT:                                                                                                                              DATE:  03/18/22 MMT of hip ER 4/5 bil Therapeutic Exercise: to improve strength and mobility.  Demo, verbal and tactile cues throughout for technique. Nustep L5 x 6 min  Seated hip up and over GTB x 12 Standing clamshell GTB x 10 Seated ER RTB x 15  Manual Therapy: to decrease muscle spasm and pain and improve mobility STM to R lateral patellar ligament, ITB knee attachment   Lower Extremity Functional Score: 63 / 80 = 78.8 %  03/16/22 Therapeutic Exercise: to improve strength and mobility.  Demo, verbal and tactile cues throughout for technique. Nustep L5 x 6 min  S/L clam R with GTB x 10 - requires PT to stabilize hip from rolling bwd Hooklying unilateral clam R GTB x 10  Therapeutic Activities: MMT, goals assessed, DGI completed    02/24/22 Therapeutic Exercise: to improve strength and mobility.  Demo, verbal and tactile cues throughout for technique. Nustep L5 x 6 min  Quadruped hip extension 2 x 10 bil Self Care: Education on sleep positioning, trialing different  positions and pillow set-up.   Manual  Therapy: to decrease muscle spasm and pain and improve mobility IASTM with foam roller to L glutes, L UPA mobs lumbar spine, TPR to L piriformis, skilled palpation and monitoring during dry needling. Trigger Point Dry-Needling  Treatment instructions: Expect mild to moderate muscle soreness. S/S of pneumothorax if dry needled over a lung field, and to seek immediate medical attention should they occur. Patient verbalized understanding of these instructions and education. Patient Consent Given: Yes Education handout provided: Previously provided Muscles treated: L piriformis Electrical stimulation performed: No Parameters: N/A Treatment response/outcome: Twitch Response Elicited and Palpable Increase in Muscle Length  02/21/22 Therapeutic Exercise: to improve strength and mobility.  Demo, verbal and tactile cues throughout for technique. Gait 4x162ft for warm up Bridges with ball squeeze x 10; arms crossed for 2nd set x 10 Prone hip extension x 10 bil Quadruped hip extension x 10 bil STS with red TB at knees x 10 - mirror used Squat with red TB x 10- mirror used Step ups 6' x 10- fwd  Lateral step up LLE x 10   PATIENT EDUCATION:  Education details: sleep positioning  Person educated: Patient Education method: Explanation, Demonstration, and Handouts Education comprehension: verbalized understanding, returned demonstration, and needs further education  HOME EXERCISE PROGRAM: Access Code: [email or web address]  ASSESSMENT:  CLINICAL IMPRESSION: Went through LEFS with patient today and completed. Focused strengthening on R ER to improve genu valgus on R knee and improve hip stability. Pt responded well to manual therapy with most TTP and soreness along the lateral knee. Pt did report slight improvement in knee pain post session. She would continue to benefit from skilled therapy to address deficits.       OBJECTIVE IMPAIRMENTS: decreased activity  tolerance, decreased balance, difficulty walking, decreased ROM, decreased strength, increased fascial restrictions, increased muscle spasms, postural dysfunction, and pain.   ACTIVITY LIMITATIONS: sleeping and locomotion level  PARTICIPATION LIMITATIONS: meal prep and community activity  PERSONAL FACTORS: Past/current experiences and Time since onset of injury/illness/exacerbation are also affecting patient's functional outcome.   REHAB POTENTIAL: Good  CLINICAL DECISION MAKING: Evolving/moderate complexity  EVALUATION COMPLEXITY: Moderate   GOALS: Goals reviewed with patient? Yes  SHORT TERM GOALS: Target date: 02/02/2022  Pt will be ind with initial HEP Baseline: Goal status: MET- 01/31/22  2.  PT will assess 5 x STS within the next 2 visits Baseline:  Goal status: MET- 01/19/22  3.  PT will assess DGI within the next 2 visits Baseline:  Goal status: MET-01/19/22   LONG TERM GOALS: Target date: 03/02/2022 extended to 04/13/22   Pt will be ind with advancing and progressing HEP Baseline:  Goal status: IN PROGRESS 02/16/22- met for current  2.  Pt will demo improved bilat hip strength to at least 4+/5 for improved transfers and mobility Baseline:  Goal status: IN PROGRESS  02/16/22- see objective.   3.  Pt will report decrease in pain by >/=50% Baseline:  Goal status: MET 02/17/22- significant improvement overall 70%, but still has intermittent flares of pain, interferes with sleep.   4.  Pt will demo L = R SLR to demo improving neural tension and hamstring length Baseline:  Goal status: MET 02/17/2022 - equal   5.  Pt will have increased LEFS to >/= 95% Baseline:  Goal status: IN PROGRESS - 03/18/22  Lower Extremity Functional Score: 63 / 80 = 78.8 %  6.  Pt will have improved DGI to 22/24 to decrease fall risk.  Baseline:  18/24 Goal status: IN PROGRESS  PLAN:  PT FREQUENCY: 2x/week  PT DURATION: 8 weeks  PLANNED INTERVENTIONS: Therapeutic exercises,  Therapeutic activity, Neuromuscular re-education, Balance training, Gait training, Patient/Family education, Self Care, Joint mobilization, Stair training, Aquatic Therapy, Dry Needling, Electrical stimulation, Spinal mobilization, Cryotherapy, Moist heat, Taping, Ionotophoresis 4mg /ml Dexamethasone, Manual therapy, and Re-evaluation  PLAN FOR NEXT SESSION:  Continue to hip/core/glute med strengthening and hip mobility; incorporate dynamic, higher level balance including gait on uneven ground; Manual work as needed.    Artist Pais, PTA  03/18/2022, 12:02 PM

## 2022-03-30 ENCOUNTER — Ambulatory Visit: Payer: Medicare Other | Admitting: Physical Therapy

## 2022-03-30 ENCOUNTER — Encounter: Payer: Self-pay | Admitting: Physical Therapy

## 2022-03-30 DIAGNOSIS — R2681 Unsteadiness on feet: Secondary | ICD-10-CM

## 2022-03-30 DIAGNOSIS — M25552 Pain in left hip: Secondary | ICD-10-CM

## 2022-03-30 DIAGNOSIS — M6281 Muscle weakness (generalized): Secondary | ICD-10-CM

## 2022-03-30 NOTE — Therapy (Signed)
OUTPATIENT PHYSICAL THERAPY TREATMENT     Patient Name: Julia Mora MRN: [id number] DOB:1950-01-05, 72 y.o., female Today's Date: 03/30/2022  END OF SESSION:  PT End of Session - 03/30/22 1059     Visit Number 15    Number of Visits 21    Date for PT Re-Evaluation 04/13/22    Authorization Type BCBS Medicare    Progress Note Due on Visit 23    PT Start Time 1100    Activity Tolerance Patient tolerated treatment well    Behavior During Therapy WFL for tasks assessed/performed                 Past Medical History:  Diagnosis Date   CKD (chronic kidney disease) stage 3, GFR 30-59 ml/min (HCC)    Hypertension    Osteoporosis    Past Surgical History:  Procedure Laterality Date   ABDOMINAL HYSTERECTOMY     ANUS SURGERY     BLADDER SURGERY     FEMUR IM NAIL Left 06/02/2019   Procedure: INTRAMEDULLARY (IM) NAIL FEMORAL;  Surgeon: Newt Minion, MD;  Location: [facility];  Service: Orthopedics;  Laterality: Left;   Patient Active Problem List   Diagnosis Date Noted   Fall    Closed left femoral fracture ([place]) 06/01/2019   Essential hypertension 06/01/2019   CKD (chronic kidney disease) stage 3, GFR 30-59 ml/min ([place]) 06/01/2019   Left knee injury 08/21/2012   Right foot pain 07/24/2012    PCP: Loraine Leriche., MD  REFERRING PROVIDER: Myrle Sheng, *  REFERRING DIAG: G57.02 (ICD-10-CM) - Piriformis syndrome of left side  THERAPY DIAG:  Pain in left hip  Muscle weakness (generalized)  Unsteadiness on feet  Rationale for Evaluation and Treatment: Rehabilitation  ONSET DATE: ~3 years  SUBJECTIVE:   SUBJECTIVE STATEMENT: Julia Mora tripped over a cord at home and fell hitting her R knee, she was seen at urgent care on 03/24/22,  X-rays of R knee only demonstrated mild degenerative changes and small joint effusion.  Discharged with RICE.   Also saw neurosurgeon on 03/21/22, going to do EMG/NCS testing for LLE, scheduled for April she  thinks.   The pain in her R knee has made her forget the pain in her back/LLE.  Knee doesn't hurt all the time, just certain positions.   PERTINENT HISTORY: Had PT in the past for her back, history of scoliosis  PAIN:  Are you having pain? Yes: NPRS scale: 7-8/10 Pain location: R knee Aggravating factors: bending, walking  PRECAUTIONS: None  WEIGHT BEARING RESTRICTIONS: No  FALLS:  Has patient fallen in last 6 months? No  LIVING ENVIRONMENT: Lives with: lives with their spouse Lives in: House/apartment Stairs: No issues with stairs Has following equipment at home: None  OCCUPATION: Retired Education officer, museum  PLOF: Independent  PATIENT GOALS: Improve pain/sleep; decrease having to use pain medicine  NEXT MD VISIT:  02/03/22  OBJECTIVE:   DIAGNOSTIC FINDINGS:  03/24/22 R knee IMPRESSION:  Mild degenerative changes. Small joint effusion. Possible  osteochondral abnormality of the lateral femoral condyle. Additional  workup when clinically appropriate   Hip x-ray from 06/07/19: 1. Prior ORIF left femur. Hardware intact. Anatomic alignment. Upper left femoral shaft fracture again noted. Some degree of early callus formation noted.   2. Sclerotic densities in the left sacrum and left pubis. Although these may represent bone islands, blastic metastatic disease cannot be excluded. Whole-body bone scan can be obtained to further Evaluate  MRI lumbar spine 11/14/20:  IMPRESSION: 1. Lumbar spine spondylosis as described above. 2. No acute osseous injury of the lumbar spine.   PATIENT SURVEYS:  LEFS 72/80 = 90%  60 / 80 = 75.0 % on 03/16/22  COGNITION: Overall cognitive status: Within functional limits for tasks assessed     SENSATION: L lateral knee decreased sensation vs R  EDEMA:  None  POSTURE: R iliac crest higher than L in standing; R lateral hip shift  MUSCLE LENGTH: Hamstrings: Right >90 deg; Left 80 deg Thomas test: Right 10 deg below plinth; Left 10 deg below  plinth  PALPATION: Pt reports L>R TTP in piriformis, glutes, lumbar paraspinals, hamstrings, gastroc 03/16/22 TTP of R lateral joint line of knee   LOWER EXTREMITY MMT:  MMT Right eval Left eval Right 02/16/22  Left 02/16/22 Right 03/16/22 Left 03/16/22  Hip flexion 4+ 4+ 4+ 4+ 4+ 4+  Hip extension 3 without trunk compensation 3- without trunk compensation 4- 4- 4- 5  Hip abduction 3+ 3+ 4- 4 4 5   Hip adduction        Hip internal rotation        Hip external rotation        Knee flexion 5 4- 5 4 5 5   Knee extension 5 4+   5 5  Ankle dorsiflexion     5 5  Ankle plantarflexion        Ankle inversion        Ankle eversion         (Blank rows = not tested)  LOWER EXTREMITY SPECIAL TESTS:  Hip special tests: Saralyn Pilar (FABER) test: positive , Thomas test: negative, and Ely's test: negative can feel in front of thigh bilat Slump test: (+) L, SLR: Neg  FUNCTIONAL TESTS:  5 times sit to stand: 11 sec no UE support DGI 02/17/2022 18/24, 03/16/22 18/24  GAIT: Distance walked: 100' Assistive device utilized: None Level of assistance: Complete Independence Comments: 02/08/22 - decreased heel strike on R, increased out toeing on L and decreased stance time and weight shift to LLE during gait, decreased gait speed 03/16/22 - decreased arm swing and trunk rotation, toeing out bil, decreased gait speed and step length       TODAY'S TREATMENT:                                                                                                                              DATE:  03/30/2022 Therapeutic Exercise: to improve strength and mobility.  Demo, verbal and tactile cues throughout for technique. Nustep L5 x 6 min Ultrasound: x 8 min to R knee 3.3 MHz, 1.2 w/cm2 cont to decrease inflammation/pain Manual Therapy: to decrease muscle spasm and pain and improve mobility STM/TPR to R knee musculature, IASTM with s/s tool to R quad, patellar mobs.    03/18/22 MMT of hip ER 4/5 bil Therapeutic  Exercise: to improve strength and mobility.  Demo, verbal and tactile cues throughout for technique. Nustep  L5 x 6 min  Seated hip up and over GTB x 12 Standing clamshell GTB x 10 Seated ER RTB x 15  Manual Therapy: to decrease muscle spasm and pain and improve mobility STM to R lateral patellar ligament, ITB knee attachment   Lower Extremity Functional Score: 63 / 80 = 78.8 %  03/16/22 Therapeutic Exercise: to improve strength and mobility.  Demo, verbal and tactile cues throughout for technique. Nustep L5 x 6 min  S/L clam R with GTB x 10 - requires PT to stabilize hip from rolling bwd Hooklying unilateral clam R GTB x 10  Therapeutic Activities: MMT, goals assessed, DGI completed    02/24/22 Therapeutic Exercise: to improve strength and mobility.  Demo, verbal and tactile cues throughout for technique. Nustep L5 x 6 min  Quadruped hip extension 2 x 10 bil Self Care: Education on sleep positioning, trialing different positions and pillow set-up.   Manual Therapy: to decrease muscle spasm and pain and improve mobility IASTM with foam roller to L glutes, L UPA mobs lumbar spine, TPR to L piriformis, skilled palpation and monitoring during dry needling. Trigger Point Dry-Needling  Treatment instructions: Expect mild to moderate muscle soreness. S/S of pneumothorax if dry needled over a lung field, and to seek immediate medical attention should they occur. Patient verbalized understanding of these instructions and education. Patient Consent Given: Yes Education handout provided: Previously provided Muscles treated: L piriformis Electrical stimulation performed: No Parameters: N/A Treatment response/outcome: Twitch Response Elicited and Palpable Increase in Muscle Length  02/21/22 Therapeutic Exercise: to improve strength and mobility.  Demo, verbal and tactile cues throughout for technique. Gait 4x197ft for warm up Bridges with ball squeeze x 10; arms crossed for 2nd set x  10 Prone hip extension x 10 bil Quadruped hip extension x 10 bil STS with red TB at knees x 10 - mirror used Squat with red TB x 10- mirror used Step ups 6' x 10- fwd  Lateral step up LLE x 10   PATIENT EDUCATION:  Education details: sleep positioning  Person educated: Patient Education method: Consulting civil engineer, Demonstration, and Handouts Education comprehension: verbalized understanding, returned demonstration, and needs further education  HOME EXERCISE PROGRAM: Access Code: [email or web address]  ASSESSMENT:  CLINICAL IMPRESSION: Julia Mora was very painful and swollen in R knee today after fall, focused on manual therapy and modalities to decrease R knee pain and inflammation, reported decreased pain following.  Julia Mora continues to demonstrate potential for improvement and would benefit from continued skilled therapy to address impairments.       OBJECTIVE IMPAIRMENTS: decreased activity tolerance, decreased balance, difficulty walking, decreased ROM, decreased strength, increased fascial restrictions, increased muscle spasms, postural dysfunction, and pain.   ACTIVITY LIMITATIONS: sleeping and locomotion level  PARTICIPATION LIMITATIONS: meal prep and community activity  PERSONAL FACTORS: Past/current experiences and Time since onset of injury/illness/exacerbation are also affecting patient's functional outcome.   REHAB POTENTIAL: Good  CLINICAL DECISION MAKING: Evolving/moderate complexity  EVALUATION COMPLEXITY: Moderate   GOALS: Goals reviewed with patient? Yes  SHORT TERM GOALS: Target date: 02/02/2022  Pt will be ind with initial HEP Baseline: Goal status: MET- 01/31/22  2.  PT will assess 5 x STS within the next 2 visits Baseline:  Goal status: MET- 01/19/22  3.  PT will assess DGI within the next 2 visits Baseline:  Goal status: MET-01/19/22   LONG TERM GOALS: Target date: 03/02/2022 extended to 04/13/22   Pt will be ind with advancing and progressing  HEP Baseline:  Goal status: IN PROGRESS 02/16/22- met for current  2.  Pt will demo improved bilat hip strength to at least 4+/5 for improved transfers and mobility Baseline:  Goal status: IN PROGRESS  02/16/22- see objective.   3.  Pt will report decrease in pain by >/=50% Baseline:  Goal status: MET 02/17/22- significant improvement overall 70%, but still has intermittent flares of pain, interferes with sleep.   4.  Pt will demo L = R SLR to demo improving neural tension and hamstring length Baseline:  Goal status: MET 02/17/2022 - equal   5.  Pt will have increased LEFS to >/= 95% Baseline:  Goal status: IN PROGRESS - 03/18/22  Lower Extremity Functional Score: 63 / 80 = 78.8 %  6.  Pt will have improved DGI to 22/24 to decrease fall risk.  Baseline:  18/24 Goal status: IN PROGRESS  PLAN:  PT FREQUENCY: 2x/week  PT DURATION: 8 weeks  PLANNED INTERVENTIONS: Therapeutic exercises, Therapeutic activity, Neuromuscular re-education, Balance training, Gait training, Patient/Family education, Self Care, Joint mobilization, Stair training, Aquatic Therapy, Dry Needling, Electrical stimulation, Spinal mobilization, Cryotherapy, Moist heat, Taping, Ionotophoresis 4mg /ml Dexamethasone, Manual therapy, and Re-evaluation  PLAN FOR NEXT SESSION:  Continue to hip/core/glute med strengthening and hip mobility; incorporate dynamic, higher level balance including gait on uneven ground; Manual work as needed.    Rennie Natter, PT, DPT  03/30/2022, 10:59 AM

## 2022-04-04 ENCOUNTER — Ambulatory Visit: Payer: Medicare Other | Admitting: Physical Therapy

## 2022-04-07 ENCOUNTER — Ambulatory Visit: Payer: Medicare Other | Attending: Neurosurgery

## 2022-04-07 DIAGNOSIS — M6281 Muscle weakness (generalized): Secondary | ICD-10-CM | POA: Diagnosis present

## 2022-04-07 DIAGNOSIS — R2681 Unsteadiness on feet: Secondary | ICD-10-CM | POA: Diagnosis present

## 2022-04-07 DIAGNOSIS — M25552 Pain in left hip: Secondary | ICD-10-CM | POA: Diagnosis not present

## 2022-04-07 NOTE — Therapy (Addendum)
OUTPATIENT PHYSICAL THERAPY TREATMENT     Patient Name: Julia Mora MRN: [id number] DOB:[birth year], 72 y.o., female Today's Date: 04/07/2022  END OF SESSION:  PT End of Session - 04/07/22 1324     Visit Number 16    Number of Visits 21    Date for PT Re-Evaluation 04/13/22    Authorization Type BCBS Medicare    Progress Note Due on Visit 23    PT Start Time 1317    PT Stop Time 1408    PT Time Calculation (min) 51 min    Activity Tolerance Patient tolerated treatment well    Behavior During Therapy WFL for tasks assessed/performed                  Past Medical History:  Diagnosis Date   CKD (chronic kidney disease) stage 3, GFR 30-59 ml/min    Hypertension    Osteoporosis    Past Surgical History:  Procedure Laterality Date   ABDOMINAL HYSTERECTOMY     ANUS SURGERY     BLADDER SURGERY     FEMUR IM NAIL Left 06/02/2019   Procedure: INTRAMEDULLARY (IM) NAIL FEMORAL;  Surgeon: Newt Minion, MD;  Location: [facility];  Service: Orthopedics;  Laterality: Left;   Patient Active Problem List   Diagnosis Date Noted   Fall    Closed left femoral fracture 06/01/2019   Essential hypertension 06/01/2019   CKD (chronic kidney disease) stage 3, GFR 30-59 ml/min 06/01/2019   Left knee injury 08/21/2012   Right foot pain 07/24/2012    PCP: Loraine Leriche., MD  REFERRING PROVIDER: Myrle Sheng, *  REFERRING DIAG: G57.02 (ICD-10-CM) - Piriformis syndrome of left side  THERAPY DIAG:  Pain in left hip  Muscle weakness (generalized)  Unsteadiness on feet  Rationale for Evaluation and Treatment: Rehabilitation  ONSET DATE: ~3 years  SUBJECTIVE:   SUBJECTIVE STATEMENT: Pt reports continuing to have R knee pain, especially transferring from sit to stand. She will see ortho doctor on Monday.  PERTINENT HISTORY: Had PT in the past for her back, history of scoliosis  PAIN:  Are you having pain? Yes: NPRS scale: 6 at rest; 8 when going from sit  to stand /10 Pain location: R knee Aggravating factors: bending, walking; going from sit to stand  PRECAUTIONS: None  WEIGHT BEARING RESTRICTIONS: No  FALLS:  Has patient fallen in last 6 months? No  LIVING ENVIRONMENT: Lives with: lives with their spouse Lives in: House/apartment Stairs: No issues with stairs Has following equipment at home: None  OCCUPATION: Retired Education officer, museum  PLOF: Independent  PATIENT GOALS: Improve pain/sleep; decrease having to use pain medicine  NEXT MD VISIT:  02/03/22  OBJECTIVE:   DIAGNOSTIC FINDINGS:  03/24/22 R knee IMPRESSION:  Mild degenerative changes. Small joint effusion. Possible  osteochondral abnormality of the lateral femoral condyle. Additional  workup when clinically appropriate   Hip x-ray from 06/07/19: 1. Prior ORIF left femur. Hardware intact. Anatomic alignment. Upper left femoral shaft fracture again noted. Some degree of early callus formation noted.   2. Sclerotic densities in the left sacrum and left pubis. Although these may represent bone islands, blastic metastatic disease cannot be excluded. Whole-body bone scan can be obtained to further Evaluate  MRI lumbar spine 11/14/20: IMPRESSION: 1. Lumbar spine spondylosis as described above. 2. No acute osseous injury of the lumbar spine.   PATIENT SURVEYS:  LEFS 72/80 = 90%  60 / 80 = 75.0 % on 03/16/22  COGNITION: Overall  cognitive status: Within functional limits for tasks assessed     SENSATION: L lateral knee decreased sensation vs R  EDEMA:  None  POSTURE: R iliac crest higher than L in standing; R lateral hip shift  MUSCLE LENGTH: Hamstrings: Right >90 deg; Left 80 deg Thomas test: Right 10 deg below plinth; Left 10 deg below plinth  PALPATION: Pt reports L>R TTP in piriformis, glutes, lumbar paraspinals, hamstrings, gastroc 03/16/22 TTP of R lateral joint line of knee   LOWER EXTREMITY MMT:  MMT Right eval Left eval Right 02/16/22   Left 02/16/22 Right 03/16/22 Left 03/16/22  Hip flexion 4+ 4+ 4+ 4+ 4+ 4+  Hip extension 3 without trunk compensation 3- without trunk compensation 4- 4- 4- 5  Hip abduction 3+ 3+ 4- 4 4 5   Hip adduction        Hip internal rotation        Hip external rotation        Knee flexion 5 4- 5 4 5 5   Knee extension 5 4+   5 5  Ankle dorsiflexion     5 5  Ankle plantarflexion        Ankle inversion        Ankle eversion         (Blank rows = not tested)  LOWER EXTREMITY SPECIAL TESTS:  Hip special tests: Julia Mora (FABER) test: positive , Thomas test: negative, and Ely's test: negative can feel in front of thigh bilat Slump test: (+) L, SLR: Neg  FUNCTIONAL TESTS:  5 times sit to stand: 11 sec no UE support DGI 02/17/2022 18/24, 03/16/22 18/24  GAIT: Distance walked: 100' Assistive device utilized: None Level of assistance: Complete Independence Comments: 02/08/22 - decreased heel strike on R, increased out toeing on L and decreased stance time and weight shift to LLE during gait, decreased gait speed 03/16/22 - decreased arm swing and trunk rotation, toeing out bil, decreased gait speed and step length   [facility] PT Assessment - 04/07/22 0001       Dynamic Gait Index   Level Surface Mild Impairment    Change in Gait Speed Mild Impairment    Gait with Horizontal Head Turns Mild Impairment    Gait with Vertical Head Turns Mild Impairment    Gait and Pivot Turn Mild Impairment    Step Over Obstacle Mild Impairment    Step Around Obstacles Normal    Steps Mild Impairment    Total Score 17                TODAY'S TREATMENT:                                                                                                                              DATE:  04/07/2022 Therapeutic Exercise: to improve strength and mobility.  Demo, verbal and tactile cues throughout for technique. Nustep L5 x 6 min Supine bridge with GTB 10x Supine SLR with QS  x 10  Seated R isometric knee extension x 10;  5 sec hold DGI completed  Ultrasound: x 8 min to R knee 3.3 MHz, 1.2 w/cm2 cont to decrease inflammation/pain STM to lateral knee jt line MWM  03/30/2022 Therapeutic Exercise: to improve strength and mobility.  Demo, verbal and tactile cues throughout for technique. Nustep L5 x 6 min Ultrasound: x 8 min to R knee 3.3 MHz, 1.2 w/cm2 cont to decrease inflammation/pain Manual Therapy: to decrease muscle spasm and pain and improve mobility STM/TPR to R knee musculature, IASTM with s/s tool to R quad, patellar mobs.    03/18/22 MMT of hip ER 4/5 bil Therapeutic Exercise: to improve strength and mobility.  Demo, verbal and tactile cues throughout for technique. Nustep L5 x 6 min  Seated hip up and over GTB x 12 Standing clamshell GTB x 10 Seated ER RTB x 15  Manual Therapy: to decrease muscle spasm and pain and improve mobility STM to R lateral patellar ligament, ITB knee attachment   Lower Extremity Functional Score: 63 / 80 = 78.8 %  03/16/22 Therapeutic Exercise: to improve strength and mobility.  Demo, verbal and tactile cues throughout for technique. Nustep L5 x 6 min  S/L clam R with GTB x 10 - requires PT to stabilize hip from rolling bwd Hooklying unilateral clam R GTB x 10  Therapeutic Activities: MMT, goals assessed, DGI completed    02/24/22 Therapeutic Exercise: to improve strength and mobility.  Demo, verbal and tactile cues throughout for technique. Nustep L5 x 6 min  Quadruped hip extension 2 x 10 bil Self Care: Education on sleep positioning, trialing different positions and pillow set-up.   Manual Therapy: to decrease muscle spasm and pain and improve mobility IASTM with foam roller to L glutes, L UPA mobs lumbar spine, TPR to L piriformis, skilled palpation and monitoring during dry needling. Trigger Point Dry-Needling  Treatment instructions: Expect mild to moderate muscle soreness. S/S of pneumothorax if dry needled over a lung field, and to seek immediate  medical attention should they occur. Patient verbalized understanding of these instructions and education. Patient Consent Given: Yes Education handout provided: Previously provided Muscles treated: L piriformis Electrical stimulation performed: No Parameters: N/A Treatment response/outcome: Twitch Response Elicited and Palpable Increase in Muscle Length  02/21/22 Therapeutic Exercise: to improve strength and mobility.  Demo, verbal and tactile cues throughout for technique. Gait 4x121ft for warm up Bridges with ball squeeze x 10; arms crossed for 2nd set x 10 Prone hip extension x 10 bil Quadruped hip extension x 10 bil STS with red TB at knees x 10 - mirror used Squat with red TB x 10- mirror used Step ups 6' x 10- fwd  Lateral step up LLE x 10   PATIENT EDUCATION:  Education details: sleep positioning  Person educated: Patient Education method: Explanation, Demonstration, and Handouts Education comprehension: verbalized understanding, returned demonstration, and needs further education  HOME EXERCISE PROGRAM: Access Code: [id number]  ASSESSMENT:  CLINICAL IMPRESSION: Julia Mora continues to report high pain in L knee with transitional movements and daily activities. She will see an orthopedist on Monday for her knee pain. She was able to do light exercises and noted some pain with SLR. She scored 17/24 on DGI which is less than before but I believe this is d/t knee pain. Performed US to R lateral knee and STM which brought her pain down post session.      OBJECTIVE IMPAIRMENTS: decreased activity tolerance, decreased balance, difficulty walking,  decreased ROM, decreased strength, increased fascial restrictions, increased muscle spasms, postural dysfunction, and pain.   ACTIVITY LIMITATIONS: sleeping and locomotion level  PARTICIPATION LIMITATIONS: meal prep and community activity  PERSONAL FACTORS: Past/current experiences and Time since onset of  injury/illness/exacerbation are also affecting patient's functional outcome.   REHAB POTENTIAL: Good  CLINICAL DECISION MAKING: Evolving/moderate complexity  EVALUATION COMPLEXITY: Moderate   GOALS: Goals reviewed with patient? Yes  SHORT TERM GOALS: Target date: 02/02/2022  Pt will be ind with initial HEP Baseline: Goal status: MET- 01/31/22  2.  PT will assess 5 x STS within the next 2 visits Baseline:  Goal status: MET- 01/19/22  3.  PT will assess DGI within the next 2 visits Baseline:  Goal status: MET-01/19/22   LONG TERM GOALS: Target date: 03/02/2022 extended to 04/13/22   Pt will be ind with advancing and progressing HEP Baseline:  Goal status: IN PROGRESS 02/16/22- met for current  2.  Pt will demo improved bilat hip strength to at least 4+/5 for improved transfers and mobility Baseline:  Goal status: IN PROGRESS  02/16/22- see objective.   3.  Pt will report decrease in pain by >/=50% Baseline:  Goal status: MET 02/17/22- significant improvement overall 70%, but still has intermittent flares of pain, interferes with sleep.   4.  Pt will demo L = R SLR to demo improving neural tension and hamstring length Baseline:  Goal status: MET 02/17/2022 - equal   5.  Pt will have increased LEFS to >/= 95% Baseline:  Goal status: IN PROGRESS - 03/18/22  Lower Extremity Functional Score: 63 / 80 = 78.8 %  6.  Pt will have improved DGI to 22/24 to decrease fall risk.  Baseline:  18/24 Goal status: IN PROGRESS  PLAN:  PT FREQUENCY: 2x/week  PT DURATION: 8 weeks  PLANNED INTERVENTIONS: Therapeutic exercises, Therapeutic activity, Neuromuscular re-education, Balance training, Gait training, Patient/Family education, Self Care, Joint mobilization, Stair training, Aquatic Therapy, Dry Needling, Electrical stimulation, Spinal mobilization, Cryotherapy, Moist heat, Taping, Ionotophoresis 4mg /ml Dexamethasone, Manual therapy, and Re-evaluation  PLAN FOR NEXT SESSION:  How did  MD visit go? Recert? Continue to hip/core/glute med strengthening and hip mobility; incorporate dynamic, higher level balance including gait on uneven ground; Manual work as needed.    Darleene Cleaver, PTA 04/07/2022, 2:30 PM  PHYSICAL THERAPY DISCHARGE SUMMARY  Visits from Start of Care: 16  Current functional level related to goals / functional outcomes: See above   Remaining deficits: See above   Education / Equipment: HEP  Plan:  Patient is being discharged due to cancelling remaining visits and not returning to PT after 04/07/2022 visit.  She would need new order to resume therapy.     Jena Gauss, PT, DPT 11:00 AM 05/18/2022

## 2022-04-12 ENCOUNTER — Ambulatory Visit: Payer: Medicare Other

## 2022-04-14 ENCOUNTER — Encounter: Payer: Medicare Other | Admitting: Physical Therapy

## 2022-10-27 ENCOUNTER — Other Ambulatory Visit: Payer: Self-pay | Admitting: Hematology & Oncology

## 2022-11-04 ENCOUNTER — Inpatient Hospital Stay: Payer: Medicare Other | Admitting: Hematology & Oncology

## 2022-11-04 ENCOUNTER — Inpatient Hospital Stay: Payer: Medicare Other

## 2022-12-20 ENCOUNTER — Other Ambulatory Visit: Payer: Self-pay | Admitting: Physical Medicine and Rehabilitation

## 2023-09-06 ENCOUNTER — Telehealth: Payer: Self-pay | Admitting: Physical Medicine and Rehabilitation

## 2023-09-06 ENCOUNTER — Other Ambulatory Visit: Payer: Self-pay

## 2023-09-06 MED ORDER — FUSION PLUS PO CAPS: 1.0000 | ORAL_CAPSULE | Freq: Every day | ORAL | 4 refills | Status: AC

## 2023-09-06 NOTE — Telephone Encounter (Signed)
 Patient called. Says the [employer]  is not working. Would like to speak with Megan.

## 2023-09-09 ENCOUNTER — Other Ambulatory Visit: Payer: Self-pay | Admitting: Physical Medicine and Rehabilitation

## 2023-09-19 ENCOUNTER — Ambulatory Visit (INDEPENDENT_AMBULATORY_CARE_PROVIDER_SITE_OTHER): Admitting: Physical Medicine and Rehabilitation

## 2023-09-19 ENCOUNTER — Encounter: Payer: Self-pay | Admitting: Physical Medicine and Rehabilitation

## 2023-09-19 DIAGNOSIS — M5442 Lumbago with sciatica, left side: Secondary | ICD-10-CM | POA: Diagnosis not present

## 2023-09-19 DIAGNOSIS — M47816 Spondylosis without myelopathy or radiculopathy, lumbar region: Secondary | ICD-10-CM | POA: Diagnosis not present

## 2023-09-19 DIAGNOSIS — G8929 Other chronic pain: Secondary | ICD-10-CM | POA: Diagnosis not present

## 2023-09-19 DIAGNOSIS — M5116 Intervertebral disc disorders with radiculopathy, lumbar region: Secondary | ICD-10-CM

## 2023-09-19 DIAGNOSIS — M5416 Radiculopathy, lumbar region: Secondary | ICD-10-CM

## 2023-09-19 MED ORDER — PREGABALIN 50 MG PO CAPS: ORAL_CAPSULE | ORAL | 0 refills | Status: DC

## 2023-09-19 NOTE — Progress Notes (Signed)
 Pain Scale   Average Pain 5 Patient advising she has chronic lower back pain and patient advising she needs new and different medication        +Driver, -BT, -Dye Allergies.

## 2023-09-19 NOTE — Progress Notes (Signed)
 Julia Mora - 73 y.o. female MRN [id number]  Date of birth: 11-Dec-1950  Office Visit Note: Visit Date: 09/19/2023 PCP: Andrew Truman GRADE., MD Referred by: Andrew Truman GRADE.,*  Subjective: Chief Complaint  Patient presents with   Lower Back - Pain   HPI: Julia Mora is a 73 y.o. female who comes in today for evaluation of chronic left sided lower back pain radiating to buttock, posterolateral thigh down to knee. Biggest pain generator is to lateral region of left knee. Pain ongoing for several years, worsens with movement and activity. She describes pain as nagging and sore sensation, currently denies pain at this time. Some relief of pain with home exercise regimen, rest and use of medications. She was placed on Tramadol while residing in rehab facility earlier in the year. Reports Tramadol helped to alleviate her pain significantly. History of formal physical therapy with good relief of pain. Lumbar MRI from 2022 exhibits multi-level facet hypertrophy and mild broad-based disc bulge with a prominent left lateral disc osteophyte complex at L5-S1. No high grade spinal canal stenosis noted. History of multiple lumbar epidural steroid injections performed in our office, most recent was left L5 and S1 transforaminal epidural steroid injection on 03/18/2021 that provided some relief for 4 days. Patient has been taking Lyrica  50 mg twice a day now for several months and feels this medication does help to alleviate her pain. She reports mild ankle swelling after starting Lyrica , no issues recently. Patient denies focal weakness, numbness and tingling. No recent trauma or falls.      Review of Systems  Musculoskeletal:  Positive for back pain.  Neurological:  Negative for tingling, sensory change, focal weakness and weakness.  All other systems reviewed and are negative.  Otherwise per HPI.  Assessment & Plan: Visit Diagnoses:    ICD-10-CM   1. Chronic left-sided low back pain  with left-sided sciatica  M54.42    G89.29     2. Lumbar radiculopathy  M54.16     3. Intervertebral disc disorders with radiculopathy, lumbar region  M51.16     4. Facet hypertrophy of lumbar region  M47.816        Plan: Findings:  Chronic left sided lower back pain radiating to buttock, posterolateral thigh down to knee. Patient continues to have severe pain despite good conservative therapies such as formal physical therapy, home exercise regimen, rest and use of medications. Patients clinical presentation and exam are consistent lumbar radiculopathy, specifically left L5 and S1 nerve patterns. Next step is to continue to monitor. Patient instructed to let us  know if her pain worsens/changes in nature. We will continue with Lyrica  50 mg twice a day, would not recommend increasing dose to 75 mg BID due to her history of chronic kidney disease. We also discussed Voltaren gel, Biofreeze, use of TENS unit and re-grouping with PT. Overall, I do not think injection therapy has been beneficial for her, would not recommend repeating injections at this time. Ultimately, I think she will need referral for chronic pain management. Could look at obtaining new lumbar MRI imaging at some point and referral to our spine surgeon Dr. Ozell Ada. She does not seem interested in surgical intervention. I encouraged her to let us  know how she is doing in the next couple of weeks. No red flag symptoms noted upon exam today.     Meds & Orders:  Meds ordered this encounter  Medications   pregabalin  (LYRICA ) 50 MG capsule    Sig: TAKE  1 CAPSULE(50 MG) BY MOUTH TWICE DAILY IN THE MORNING AND AT BEDTIME    Dispense:  180 capsule    Refill:  0   No orders of the defined types were placed in this encounter.   Follow-up: Return if symptoms worsen or fail to improve.   Procedures: No procedures performed      Clinical History: MRI LUMBAR SPINE WITHOUT CONTRAST     TECHNIQUE:  Multiplanar, multisequence MR  imaging of the lumbar spine was  performed. No intravenous contrast was administered.     COMPARISON:  None.     FINDINGS:  Segmentation:  Standard.     Alignment: Levoscoliosis of the thoracolumbar spine. 2 mm  retrolisthesis of L2 on L3. Minimal grade 1 anterolisthesis of L4 on  L5 and L5 on S1 secondary to facet disease.     Vertebrae: No acute fracture, evidence of discitis, or aggressive  bone lesion.     Conus medullaris and cauda equina: Conus extends to the T12-L1  level. Conus and cauda equina appear normal.     Paraspinal and other soft tissues: No acute paraspinal abnormality.     Disc levels:     Disc spaces: Degenerative disease with disc height loss at L2-3 and  L3-4. Mild disc height loss at L5-S1.     T12-L1: No disc protrusion, foraminal stenosis or central canal  stenosis.     L1-L2: Mild broad-based disc bulge. No foraminal or central canal  stenosis.     L2-L3: Mild broad-based disc bulge eccentric towards the right. Mild  bilateral facet arthropathy. Right lateral recess stenosis. No  foraminal or central canal stenosis.     L3-L4: Broad-based disc bulge. No foraminal or central canal  stenosis.     L4-L5: Broad-based disc bulge. Mild bilateral facet arthropathy with  right facet effusion. No foraminal or central canal stenosis.     L5-S1: Mild broad-based disc bulge with a prominent left lateral  disc osteophyte complex. Mild left facet arthropathy. No foraminal  or central canal stenosis.     IMPRESSION:  1. Lumbar spine spondylosis as described above.  2. No acute osseous injury of the lumbar spine.        Electronically Signed    By: Julaine Blanch M.D.    On: 11/15/2020 08:18   She reports that she has never smoked. She has never used smokeless tobacco. No results for input(s): HGBA1C, LABURIC in the last 8760 hours.  Objective:  VS:  HT:    WT:   BMI:     BP:   HR: bpm  TEMP: ( )  RESP:  Physical Exam Vitals and nursing note  reviewed.  HENT:     Head: Normocephalic and atraumatic.     Right Ear: External ear normal.     Left Ear: External ear normal.     Nose: Nose normal.     Mouth/Throat:     Mouth: Mucous membranes are moist.  Eyes:     Extraocular Movements: Extraocular movements intact.  Cardiovascular:     Rate and Rhythm: Normal rate.     Pulses: Normal pulses.  Pulmonary:     Effort: Pulmonary effort is normal.  Abdominal:     General: Abdomen is flat. There is no distension.  Musculoskeletal:        General: Tenderness present.     Cervical back: Normal range of motion.     Comments: Patient is slow to rise from seated position to standing. Good lumbar  range of motion. No pain noted with facet loading. 5/5 strength noted with bilateral hip flexion, knee flexion/extension, ankle dorsiflexion/plantarflexion and EHL. No clonus noted bilaterally. No pain upon palpation of greater trochanters. No pain with internal/external rotation of bilateral hips. Sensation intact bilaterally. Dysesthesias noted to left L5 and S1 dermatomes. Negative slump test bilaterally. Ambulates without aid, gait steady.     Skin:    General: Skin is warm and dry.     Capillary Refill: Capillary refill takes less than 2 seconds.  Neurological:     General: No focal deficit present.     Mental Status: She is alert and oriented to person, place, and time.  Psychiatric:        Mood and Affect: Mood normal.        Behavior: Behavior normal.     Ortho Exam  Imaging: No results found.  Past Medical/Family/Surgical/Social History: Medications & Allergies reviewed per EMR, new medications updated. Patient Active Problem List   Diagnosis Date Noted   Fall    Closed left femoral fracture (HCC) 06/01/2019   Essential hypertension 06/01/2019   CKD (chronic kidney disease) stage 3, GFR 30-59 ml/min (HCC) 06/01/2019   Left knee injury 08/21/2012   Right foot pain 07/24/2012   Past Medical History:  Diagnosis Date   CKD  (chronic kidney disease) stage 3, GFR 30-59 ml/min (HCC)    Hypertension    Osteoporosis    Family History  Problem Relation Age of Onset   Heart attack Father    Diabetes Neg Hx    Hyperlipidemia Neg Hx    Hypertension Neg Hx    Past Surgical History:  Procedure Laterality Date   ABDOMINAL HYSTERECTOMY     ANUS SURGERY     BLADDER SURGERY     FEMUR IM NAIL Left 06/02/2019   Procedure: INTRAMEDULLARY (IM) NAIL FEMORAL;  Surgeon: Harden Jerona GAILS, MD;  Location: [facility] OR;  Service: Orthopedics;  Laterality: Left;   Social History   Occupational History   Not on file  Tobacco Use   Smoking status: Never   Smokeless tobacco: Never  Vaping Use   Vaping status: Never Used  Substance and Sexual Activity   Alcohol use: No   Drug use: No   Sexual activity: Not on file

## 2023-09-27 ENCOUNTER — Telehealth: Payer: Self-pay | Admitting: Physical Medicine and Rehabilitation

## 2023-09-27 NOTE — Telephone Encounter (Signed)
 Patient called. Has some questions about how she should take her medication? Is it 1 in AM and 1 in the PM? Also she would like Megan to know that her pain is at night the most. It makes her off balance. Please call her.

## 2023-11-06 ENCOUNTER — Encounter: Payer: Self-pay | Admitting: Radiology

## 2023-12-19 ENCOUNTER — Other Ambulatory Visit: Payer: Self-pay | Admitting: Physical Medicine and Rehabilitation
